# Patient Record
Sex: Male | Born: 1935
Health system: Southern US, Community
[De-identification: ages and names within clinical notes are randomized; demographics above are authoritative.]

## PROBLEM LIST (undated history)

## (undated) DIAGNOSIS — Z973 Presence of spectacles and contact lenses: Secondary | ICD-10-CM

## (undated) DIAGNOSIS — I10 Essential (primary) hypertension: Secondary | ICD-10-CM

## (undated) DIAGNOSIS — E039 Hypothyroidism, unspecified: Secondary | ICD-10-CM

## (undated) DIAGNOSIS — K219 Gastro-esophageal reflux disease without esophagitis: Secondary | ICD-10-CM

## (undated) DIAGNOSIS — I499 Cardiac arrhythmia, unspecified: Secondary | ICD-10-CM

## (undated) DIAGNOSIS — R51 Headache: Secondary | ICD-10-CM

## (undated) DIAGNOSIS — E785 Hyperlipidemia, unspecified: Secondary | ICD-10-CM

## (undated) DIAGNOSIS — N400 Enlarged prostate without lower urinary tract symptoms: Secondary | ICD-10-CM

## (undated) DIAGNOSIS — R519 Headache, unspecified: Secondary | ICD-10-CM

## (undated) DIAGNOSIS — C801 Malignant (primary) neoplasm, unspecified: Secondary | ICD-10-CM

## (undated) DIAGNOSIS — I639 Cerebral infarction, unspecified: Secondary | ICD-10-CM

## (undated) DIAGNOSIS — N3281 Overactive bladder: Secondary | ICD-10-CM

## (undated) DIAGNOSIS — R0600 Dyspnea, unspecified: Secondary | ICD-10-CM

## (undated) DIAGNOSIS — F419 Anxiety disorder, unspecified: Secondary | ICD-10-CM

## (undated) DIAGNOSIS — G459 Transient cerebral ischemic attack, unspecified: Secondary | ICD-10-CM

## (undated) DIAGNOSIS — M1990 Unspecified osteoarthritis, unspecified site: Secondary | ICD-10-CM

## (undated) DIAGNOSIS — N189 Chronic kidney disease, unspecified: Secondary | ICD-10-CM

## (undated) HISTORY — DX: Essential (primary) hypertension: I10

## (undated) HISTORY — DX: Hypothyroidism, unspecified: E03.9

## (undated) HISTORY — DX: Chronic kidney disease, unspecified: N18.9

## (undated) HISTORY — PX: TRIGGER FINGER RELEASE: SHX641

## (undated) HISTORY — PX: CATARACT EXTRACTION: SUR2

## (undated) HISTORY — DX: Cardiac arrhythmia, unspecified: I49.9

## (undated) HISTORY — DX: Hyperlipidemia, unspecified: E78.5

## (undated) HISTORY — PX: EYE SURGERY: SHX253

## (undated) HISTORY — PX: COLONOSCOPY: SHX174

---

## 1997-10-17 HISTORY — PX: KNEE ARTHROPLASTY: SHX992

## 2006-10-17 HISTORY — PX: KNEE ARTHROPLASTY: SHX992

## 2008-09-22 IMAGING — CR DG CHEST 2V
2 series · 2 of 2 positions shown · non-contrast
Comparison: No priors

CLINICAL DATA: Pre admit for knee surgery

CHEST - 2 VIEW

[view not recorded (1 of 2)]
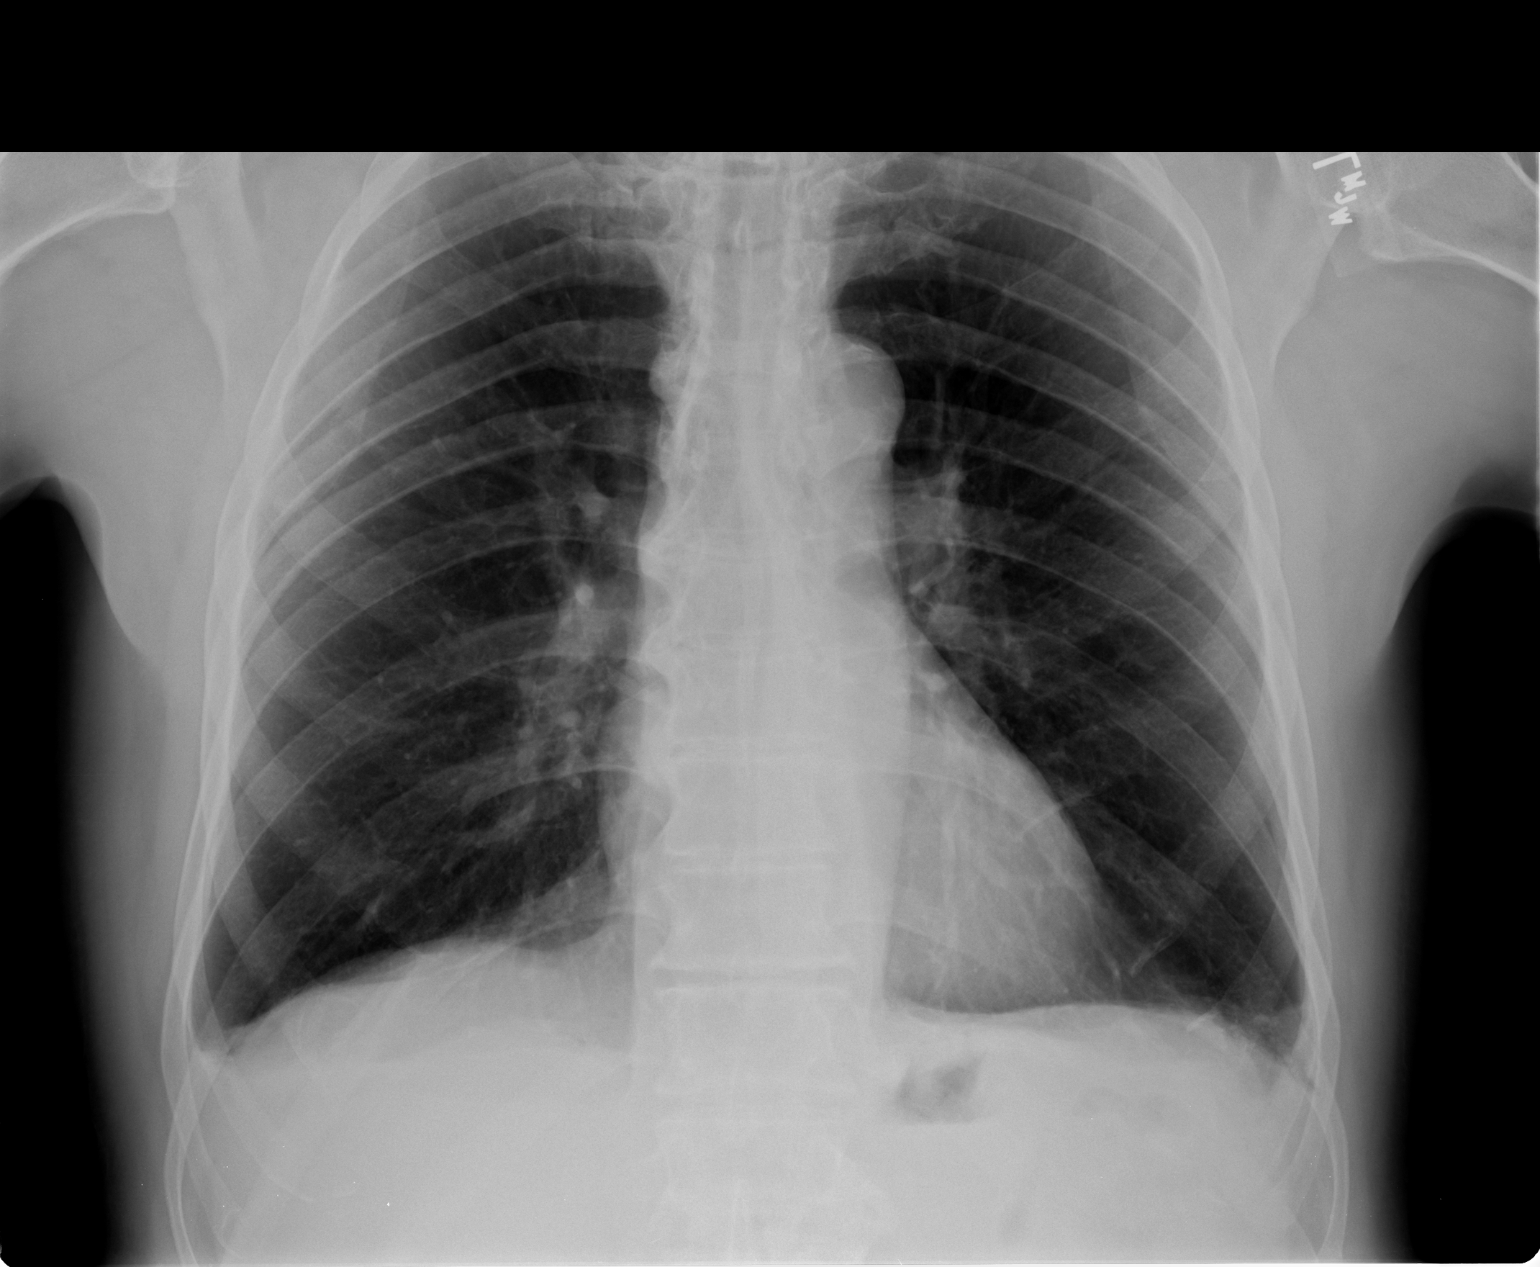

[view not recorded (2 of 2)]
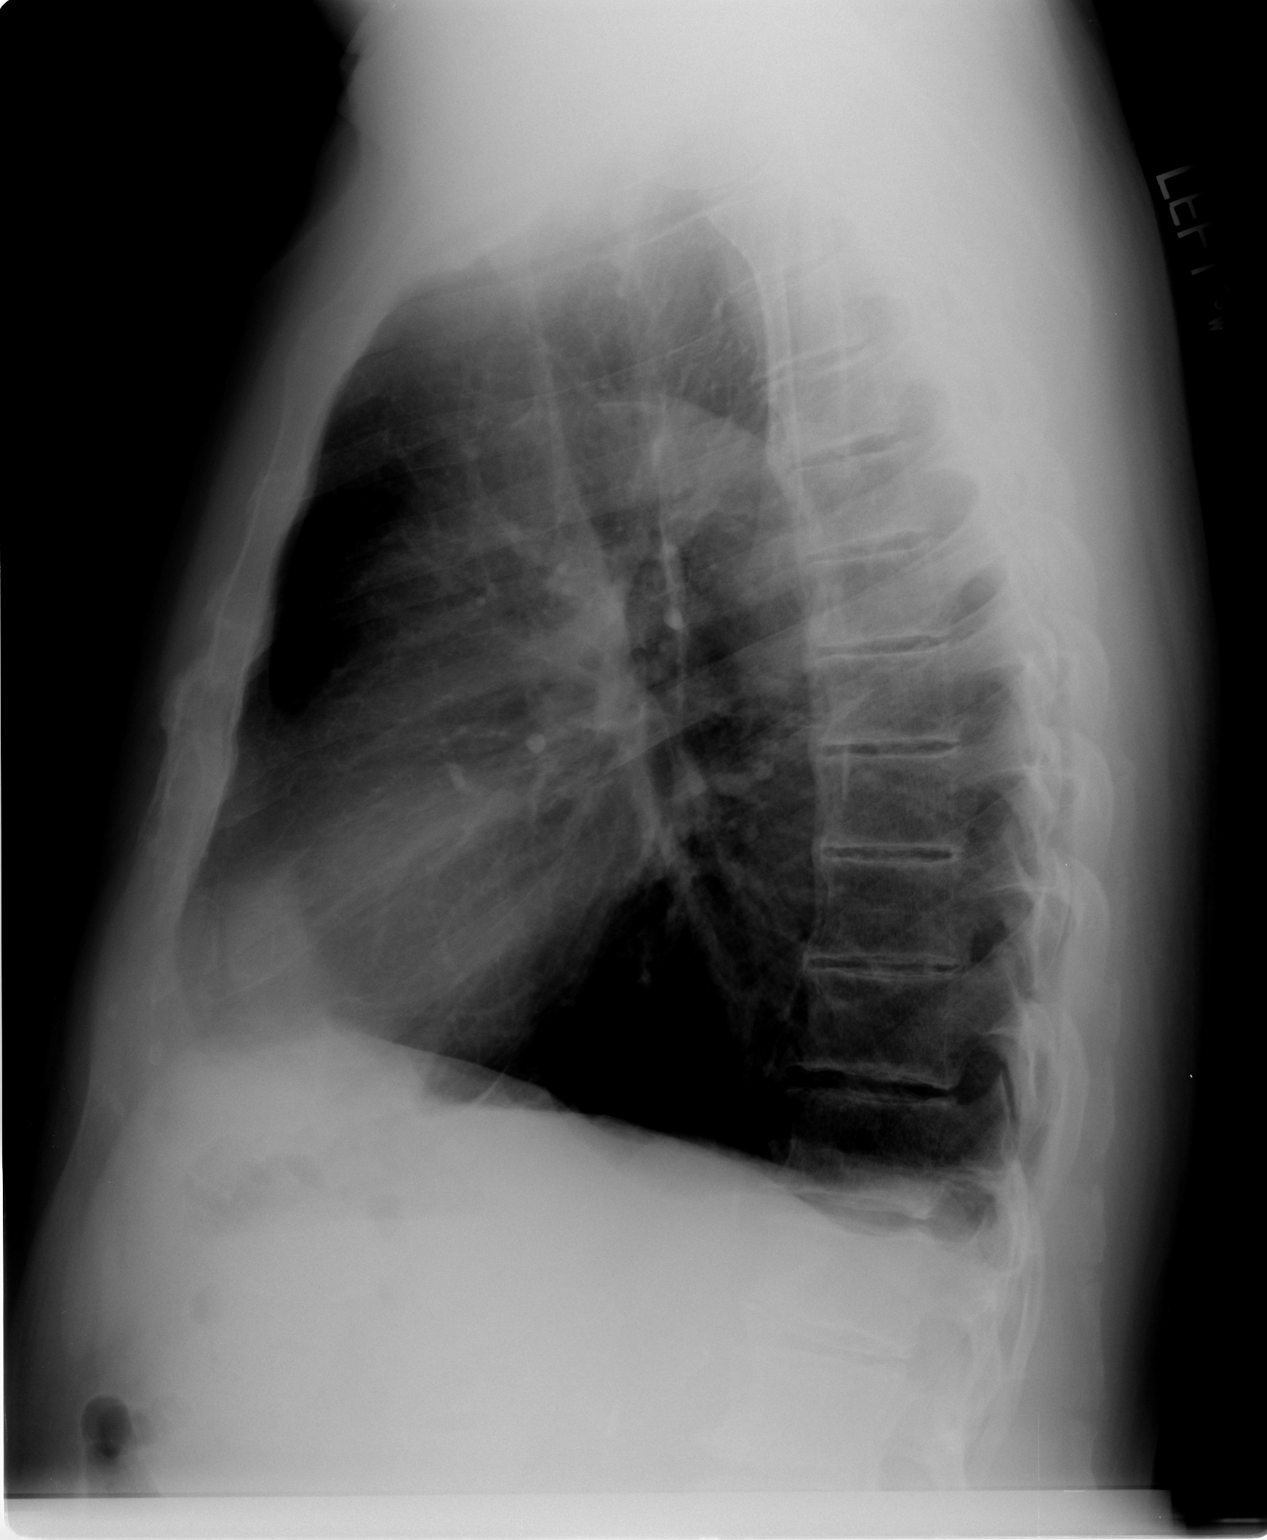

[2 of 2 positions shown; findings below may reference images not displayed]

FINDINGS: Heart and mediastinal contours normal.  Lungs
hyperaerated with scarring at the bases.  To no acute pulmonary
disease.  Osseous structures intact.
IMPRESSION: COPD with scarring at the bases - no active disease.

## 2008-09-26 ENCOUNTER — Inpatient Hospital Stay (HOSPITAL_COMMUNITY): Admission: RE | Admit: 2008-09-26 | Discharge: 2008-09-30 | Payer: Self-pay | Admitting: Orthopedic Surgery

## 2008-09-28 IMAGING — CR DG CHEST 1V PORT
1 series · 1 of 1 positions shown · non-contrast
Comparison: [DATE].

CLINICAL DATA: Fever.  Osteoarthritis.

PORTABLE CHEST - 1 VIEW

[view not recorded]
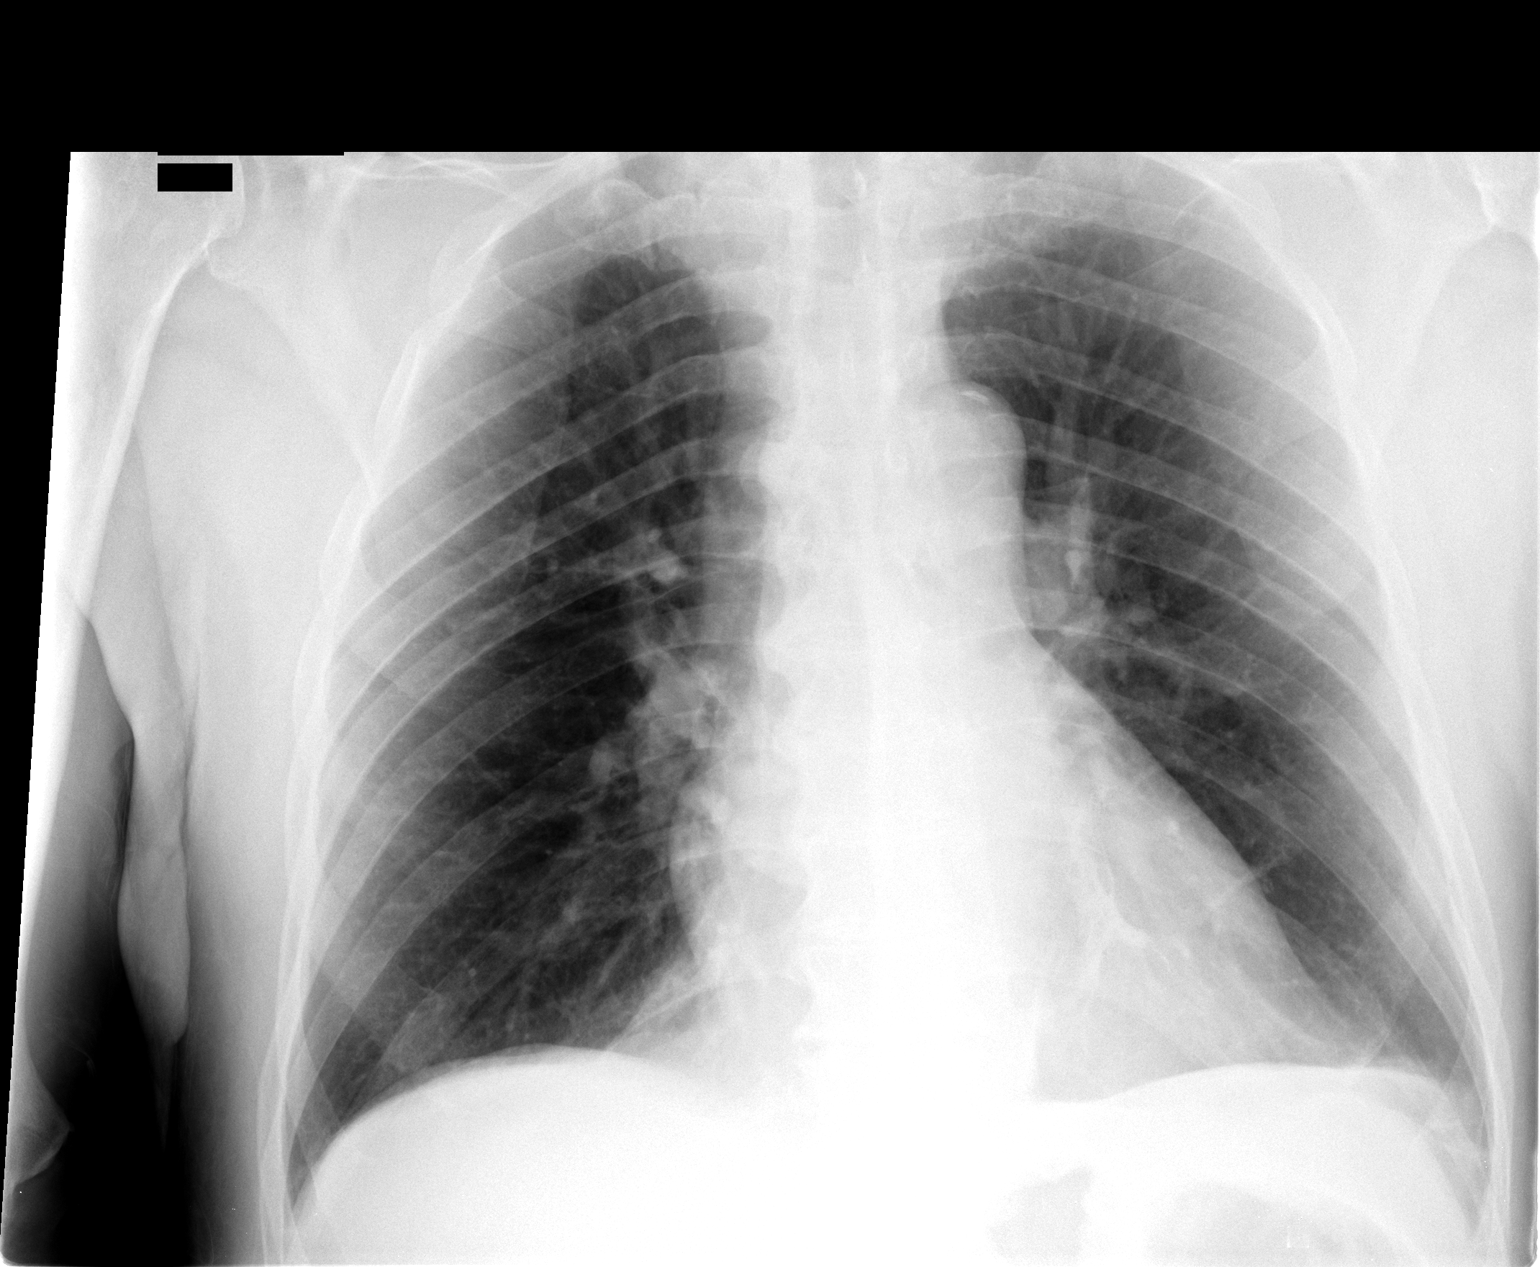

[1 of 1 positions shown; findings below may reference images not displayed]

FINDINGS: [RO] hours.  The heart size and mediastinal contours are
stable.  There is stable linear scarring or atelectasis at the left
lung base.  The lungs are otherwise clear.  There is no pleural
effusion or pneumothorax.
IMPRESSION: Stable left basilar scarring or atelectasis.  No acute findings.

## 2011-03-01 NOTE — Op Note (Signed)
[name], [name]              ACCOUNT NO.:  [id number]   MEDICAL RECORD NO.:  [id number]          PATIENT TYPE:  INP   LOCATION:  2899                         FACILITY:  [facility]   PHYSICIAN:  Ollen Gross, M.D.    DATE OF BIRTH:  [birth year]   DATE OF PROCEDURE:  09/26/2008  DATE OF DISCHARGE:                               OPERATIVE REPORT   PREOPERATIVE DIAGNOSIS:  Osteoarthritis, right knee.   POSTOPERATIVE DIAGNOSIS:  Osteoarthritis, right knee.   PROCEDURE:  Right total knee arthroplasty.   SURGEON:  Ollen Gross, M.D.   ASSISTANT:  Avel Peace PA-C   ANESTHESIA:  Spinal and a femoral block.   ESTIMATED BLOOD LOSS:  Minimal.   DRAIN:  None.   TOURNIQUET TIME:  46 minutes at 300 mmHg.   COMPLICATIONS:  None.   CONDITION.:  Stable to recovery room.   CLINICAL NOTE:  Scott [name] is a 77 year old male with end-stage  arthritis of the right knee with progressively worsening pain and  dysfunction.  He has failed nonoperative management and presents now for  right total knee arthroplasty.   PROCEDURE IN DETAIL:  After successful administration of femoral block  and then spinal anesthetic a tourniquet was placed high on his right  thigh and right lower extremity prepped and draped in the usual sterile  fashion.  Extremity was wrapped in Esmarch, knee flexed and tourniquet  inflated to 300 mmHg.  Midline incision was made with a 10 blade through  subcutaneous tissue to the level of the extensor mechanism.  A fresh  blade was used make a medial parapatellar arthrotomy.  Soft tissue of  the proximal medial tibia was subperiosteally elevated to the joint line  with a knife and into the semimembranosus bursa with a Cobb elevator.  Soft tissue laterally was elevated with attention being paid to avoiding  the patellar tendon on tibial tubercle.  The patella subluxed laterally,  knee flexed 90 degrees, and ACL and PCL were removed.  Drill was used  create a starting hole in  the distal femur and the canal was thoroughly  irrigated.  The 5-degree right valgus alignment guide was placed  referencing off the posterior condyles, rotations marked on the block  pins to remove 11 mm of the distal femur.  The 11 mm were resected due  to the flexion contracture.  Distal femoral resection was made with an  oscillating saw.  Sizing blocks placed and size 4 was most appropriate.  Rotations marked off the epicondylar axis.  Size 4 cutting block was  placed and the anterior-posterior chamfer cuts made.   The tibia subluxed forward and the menisci were removed.  Extramedullary  tibial alignment guide was placed, referencing proximally at the medial  aspect of the tibial tubercle and distally along the second metatarsal  axis and tibial crest.  The block was pinned to remove approximately 10  mm from the non-deficient lateral side.  Tibial resection was made with  an oscillating saw.  Size 4 was the most appropriate tibial component  and the proximal tibia was prepared to modular drill and keel punch for  the size 4.  Femoral preparation was completed with the intercondylar  cut.   Size 4 mobile bearing tibial trial, size 4 posterior stabilized femoral  trial and a 10 mm posterior stabilized rotating platform insert trial  were placed.  With the 10, full extension was  achieved with excellent  varus, valgus, anterior and posterior balance throughout full range of  motion.  The patella was everted and thickness measured to be 23 mm.  Freehand resection was taken to 13 mm, 35 template was placed, lug holes  were drilled, trial patella was placed and it tracks normally.  Osteophytes were removed off the posterior femur with the trial in  place.  All trials were removed and the cut bone surfaces were prepared  with pulsatile lavage.  Cement was mixed and once ready for  implantation, a size 4 mobile bearing tibial tray, size 4 posterior  stabilized femur and 35 patella were  cemented into place.  The patella  was held with a clamp.  Trial 10-mm inserts placed, knee held in full  extension and all extruded cement removed.  When the cement was fully  hardened, then the  permanent 10 mm posterior stabilized rotating  platform insert was placed into the tibial tray.  The wound was  copiously irrigated with saline solution and the FloSeal injected on the  posterior capsule, medial and lateral gutters and suprapatellar area.  Moist sponge was placed and tourniquet released for a total time of 46  minutes.  The sponge was held 2 minutes then removed.  Minimal bleeding  was encountered.  The bleeding that was encountered was stopped with  electrocautery.   The wound was copiously irrigated with saline solution and the  arthrotomy closed with interrupted #1 PDS.  Flexion against gravity was  135 degrees.  Subcu was closed with interrupted 2-0 Vicryl and  subcuticular running 4-0 Monocryl.  Incisions cleaned and dried and  Steri-Strips and a bulky sterile dressing applied.  The leg was placed  into a knee immobilizer and he was awakened and transported to recovery  in stable condition.      Ollen Gross, M.D.  Electronically Signed     FA/MEDQ  D:  09/26/2008  T:  09/26/2008  Job:  [id number]

## 2011-03-01 NOTE — Discharge Summary (Signed)
[name], [name]              ACCOUNT NO.:  [id number]   MEDICAL RECORD NO.:  [id number]          PATIENT TYPE:  INP   LOCATION:  5016                         FACILITY:  [facility]   PHYSICIAN:  Ollen Gross, M.D.    DATE OF BIRTH:  Sep 19, 1928   DATE OF ADMISSION:  09/26/2008  DATE OF DISCHARGE:  09/30/2008                               DISCHARGE SUMMARY   ADMISSION DIAGNOSES:  1. Osteoarthritis, right knee.  2. Hypertension.  3. Reflux disease.  4. Hemorrhoids.  5. Hypothyroidism.  6. Arthritis.  7. Childhood illnesses:  Measles, mumps, Rubella, and chickenpox.   DISCHARGE DIAGNOSES:  1. Osteoarthritis, right knee, status post right total knee      replacement arthroplasty.  2. Shoulder bursitis, recurrence.  3. Sinus congestion.  4. Mild postoperative hyponatremia.  5. Hypertension.  6. Reflux disease.  7. Hemorrhoids.  8. Hypothyroidism.  9. Arthritis.  10.Childhood illnesses:  Measles, mumps, Rubella, and chickenpox.   PROCEDURE:  On September 26, 2008, right total hip.   SURGEON:  Ollen Gross, M.D.   ASSISTANT:  Alexzandrew L. Perkins, P.A.-C.   ANESTHESIA:  General with a femoral block.   CONSULTS:  None.   BRIEF HISTORY:  Scott [name] is a 77 year old male seen as a second  opinion with regards to his right knee.  He has known end-stage  arthritis, felt to be a good candidate.  Risks and benefits were  discussed.  He elected to proceed with surgery.   LABORATORY DATA:  Preop CBC showed a hemoglobin of 14.7, hematocrit  43.9, white cell count 5.2, platelets 182.  Chem panel all within normal  limits.  Preop UA is negative.  Serial CBCs were followed throughout the  hospital course.  Hemoglobin dropped down to 12.4, then 10.6, last noted  at 10.5 with a creatinine of 31.4.  Serial pro times followed per  Coumadin protocol postoperatively.  INR started out at 1.1.  Last-noted  PT/INR 22.4 and 1.9.  Chem panel on admission within normal limits.  Sodium did  drop to 135 and went down a little bit further to 130.  It  stabilized at 130.  The remaining electrolytes remained within normal  limits.   X-RAYS:  Portable chest on September 28, 2008:  Stable left basilar  scarring or atelectasis.  No acute findings.   Preop chest x-ray dated September 22, 2008:  COPD with scarring at the  base.  No active disease.   EKG dated August 18, 2008:  Sinus rhythm, unconfirmed.   HOSPITAL COURSE:  Patient was admitted to [facility],  tolerated to the OR, underwent the above-stated procedure without  complications.  Patient tolerate the procedure well, and was later  transferred to the recovery room and then to the orthopedic floor.  Given 24 hours of postop IV antibiotics and started on PCA and p.o.  analgesics for pain control following surgery.   On day #1, doing fairly well, so the PCA was discontinued on day #1 and  removed the Foley a little bit later that day.  He started complaining  of some shoulder pain.  He  has a history of some shoulder complaints and  felt he has developed a little bit of bursitis.  Labs looked good.  Started getting up out of bed.   By day #2, he was doing some minimal assistance.  Seen  by weekend  coverage.  Dressing was changed.  The incision was healing well.  Compartments were soft.  No complaints.  Discharge planning got involved  postoperatively because it was felt that he would require some type of  skilled facility.  PT/OT worked with her.  He was walking about 10 feet  or so by day #2.   By day #3, he is seen back in rounds and was developing some sinus  congestion.  Also, the right shoulder pain had gotten worse.  He had a  little low-grade temp, so put him on some antibiotics in the form of  Levaquin.  We also put him on a dose pack for the right shoulder  bursitis.  We are awaiting bed availability.  Seen back on rounds on  September 30, 2008.  He is doing much better from a therapy standpoint.  He  is walking about 75 feet.  We are waiting on a bed to become  available at skilled facility.  Once bed was available, we will allow  him to transfer at that time.   DISCHARGE PLAN:  Tentative discharge on September 30, 2008.   DISCHARGE DIAGNOSES:  Please see above.   DISCHARGE MEDICATIONS:  1. Labetalol 200 mg p.o. daily.  2. Levothyroxine 125 mg p.o. daily.  3. Prilosec 20 mg p.o. daily.  4. Cardura 8 mg p.o. nightly.  5. Norvasc 2.5 mg p.o. nightly.  6. Coumadin protocol.  Please titrate the Coumadin level for a target      INR between 2 and 3.  He is to be on Coumadin for 3 weeks from the      date of surgery on September 26, 2008.  7. Colace 100 mg p.o. b.i.d.  8. Singulair 10 mg p.o. daily.  9. Medrol Dosepak 4 mg x6 days.  Take as directed.  10.Avelox 400 mg p.o. daily for 8 more days, to complete a 10-day      course.  11.Imodium p.r.n.  12.Percocet 5 mg 1-2 every 4 hours as needed for pain.  13.Tylenol 325 1-2 every 4-6 hours as needed for mild pain, temp, or      headache.  14.Robaxin 500 mg p.o. q.6-8h. p.r.n. spasm.   DIET:  Low sodium, heart-healthy diet.   ACTIVITY:  Weightbearing as tolerated to the right lower extremity.  Total knee protocol.  PT/OT for gait training, ambulation, and ADLs.  May start showering; however, do not submerge the incision under water.  Daily dressing changes.   FOLLOW UP:  Needs to follow up with Dr. Lequita Halt from 2 weeks of the date  of surgery.  Please contact the office at [phone] to arrange an  appointment time to follow up with this patient.   DISPOSITION:  Pending at this time, waiting on bed availability.   CONDITION ON DISCHARGE:  Improving.      Alexzandrew L. Perkins, P.A.C.      Ollen Gross, M.D.  Electronically Signed    ALP/MEDQ  D:  09/30/2008  T:  09/30/2008  Job:  [id number]   cc:   Dr. Toma Deiters, PA-C

## 2011-03-01 NOTE — H&P (Signed)
Scott [name], Scott [name]              ACCOUNT NO.:  [id number]   MEDICAL RECORD NO.:  [id number]          PATIENT TYPE:  INP   LOCATION:  NA                           FACILITY:  [facility]   PHYSICIAN:  Alexzandrew L. Perkins, P.A.C.DATE OF BIRTH:  1928-08-06   DATE OF ADMISSION:  09/26/2008  DATE OF DISCHARGE:                              HISTORY & PHYSICAL   CHIEF COMPLAINT:  Right knee pain.   HISTORY OF PRESENT ILLNESS:  The patient is a 77 year old male who in  seen in second opinion with regards to the right knee.  She has  bilateral knee pain, but the right is more symptomatic and problematic  than the left.  She had left total knee done about 10 or 11 years ago.  She is having some problems with that knee.  Unfortunately, the right  knee is the more difficult knee at this time.  It is limiting her  activities and the pain has gotten worse.  She has been treated  conservatively in the past.  She has undergone injections.  X-rays when  she comes into the office show that she does have some end-stage  arthritis with severe bone-on-bone medial and patellofemoral  compartments.  Left knee prosthesis appears to be in good position with  no obvious prosthetic abnormalities appreciated.  It is felt that she  would benefit undergoing total knee replacement.  Risks and benefits  have been discussed.  She elects to proceed with surgery.  She has been  seen preoperatively by Vanetta Mulders, PA that works for Dr. Eduard Clos and the  patient is felt to be stable for surgery.   ALLERGIES:  No known drug allergies.   INTOLERANCES:  MORPHINE causes nausea.   CURRENT MEDICATIONS:  Naproxen, acetaminophen, labetalol, levothyroxine,  omeprazole, doxazosin, amlodipine, lisinopril, an allergy pill Imodium.   PAST MEDICAL HISTORY:  Hypertension, reflux disease, hemorrhoids,  hypothyroidism, arthritis.  Childhood illnesses of measles, mumps,  rubella, and chickenpox.   PAST SURGICAL HISTORY:  Left knee  replacement in September 1998.  Carpal  tunnel surgery, 2004.  Two trigger fingers, right hand, 2004.   FAMILY HISTORY:  Father deceased at age 47.  He was quadriplegic  secondary to a fall.  Mother deceased at age 51 with heart attack.   SOCIAL HISTORY:  She is married, past smoker.  Family will be assisting  with care after surgery, lives in a one-storey home with 2 steps  entering.  She does have a living will and healthcare power of attorney.   REVIEW OF SYSTEMS:  GENERAL:  No fevers, chills, or night sweats.  NEUROLOGIC:  No seizures, syncope, or paralysis.  RESPIRATORY:  No  shortness of breath, productive cough, or hemoptysis.  CARDIOVASCULAR:  No chest pain or orthopnea.  GI:  Little bit of diarrhea.  No nausea,  vomiting, or constipation.  GU:  A little bit of weak stream, nocturia.  No dysuria or hematuria.  MUSCULOSKELETAL:  Joint pain and stiffness.   PHYSICAL EXAMINATION:  VITAL SIGNS:  Pulse 64, respiratory rate 12, and  blood pressure 144/78.  GENERAL:  A 77 year old white male,  well nourished, well developed, in  no acute distress.  She is alert, oriented, and cooperative.  Very  pleasant at the time of exam.  HEENT:  Normocephalic and atraumatic.  Pupils are equal, round, and  reactive.  Oropharynx is clear.  EOMs intact.  NECK:  Supple.  CHEST:  Clear.  HEART:  Regular rate and rhythm with occasional skipped beat or dropped  beat.  Pulse, somewhat of regular occurring pulse.  ABDOMEN:  Soft, nontender.  Bowel sounds present.  RECTAL:  Not done.  BREASTS:  Not done.  GENITALIA:  Not done.  EXTREMITIES:  Left knee, no effusion.  Range of motion is 10-120,  moderate crepitus, tender more medial than lateral.   IMPRESSION:  Osteoarthritis, right knee.   PLAN:  The patient admitted to [facility] to undergo right  total knee replacement arthroplasty.  Surgery will be performed by Dr.  Ollen Gross.      Alexzandrew L. Perkins, P.A.C.     ALP/MEDQ   D:  09/25/2008  T:  09/26/2008  Job:  [id number]   cc:   Ollen Gross, M.D.  Vanetta Mulders, PA-C  Charles D. Eduard Clos, MD

## 2011-07-22 LAB — PROTIME-INR
INR: 1 (ref 0.00–1.49)
INR: 1.1 (ref 0.00–1.49)
INR: 1.4 (ref 0.00–1.49)
INR: 1.9 — ABNORMAL HIGH (ref 0.00–1.49)
Prothrombin Time: 13.2 seconds (ref 11.6–15.2)
Prothrombin Time: 15 s (ref 11.6–15.2)
Prothrombin Time: 17.6 s — ABNORMAL HIGH (ref 11.6–15.2)
Prothrombin Time: 22.4 s — ABNORMAL HIGH (ref 11.6–15.2)

## 2011-07-22 LAB — BASIC METABOLIC PANEL WITH GFR
BUN: 18 mg/dL (ref 6–23)
CO2: 27 meq/L (ref 19–32)
Calcium: 7.8 mg/dL — ABNORMAL LOW (ref 8.4–10.5)
Chloride: 98 meq/L (ref 96–112)
Creatinine, Ser: 1.33 mg/dL (ref 0.4–1.5)
GFR calc non Af Amer: 52 mL/min — ABNORMAL LOW
Glucose, Bld: 134 mg/dL — ABNORMAL HIGH (ref 70–99)
Potassium: 4.2 meq/L (ref 3.5–5.1)
Sodium: 130 meq/L — ABNORMAL LOW (ref 135–145)

## 2011-07-22 LAB — CBC
HCT: 31.1 % — ABNORMAL LOW (ref 39.0–52.0)
HCT: 31.4 % — ABNORMAL LOW (ref 39.0–52.0)
HCT: 35.5 % — ABNORMAL LOW (ref 39.0–52.0)
HCT: 43.9 % (ref 39.0–52.0)
Hemoglobin: 10.5 g/dL — ABNORMAL LOW (ref 13.0–17.0)
Hemoglobin: 10.6 g/dL — ABNORMAL LOW (ref 13.0–17.0)
Hemoglobin: 12.4 g/dL — ABNORMAL LOW (ref 13.0–17.0)
MCHC: 33.5 g/dL (ref 30.0–36.0)
MCHC: 34 g/dL (ref 30.0–36.0)
MCV: 93.4 fL (ref 78.0–100.0)
MCV: 93.6 fL (ref 78.0–100.0)
MCV: 94.3 fL (ref 78.0–100.0)
Platelets: 122 K/uL — ABNORMAL LOW (ref 150–400)
RBC: 3.32 MIL/uL — ABNORMAL LOW (ref 4.22–5.81)
RBC: 3.33 MIL/uL — ABNORMAL LOW (ref 4.22–5.81)
RBC: 4.71 MIL/uL (ref 4.22–5.81)
RDW: 14.1 % (ref 11.5–15.5)
RDW: 14.1 % (ref 11.5–15.5)
WBC: 11.8 K/uL — ABNORMAL HIGH (ref 4.0–10.5)
WBC: 5.2 10*3/uL (ref 4.0–10.5)

## 2011-07-22 LAB — BASIC METABOLIC PANEL
CO2: 27 mEq/L (ref 19–32)
CO2: 28 mEq/L (ref 19–32)
Chloride: 96 mEq/L (ref 96–112)
Creatinine, Ser: 1.22 mg/dL (ref 0.4–1.5)
GFR calc Af Amer: >60 mL/min (ref >60)
GFR calc non Af Amer: >60 mL/min (ref >60)
Glucose, Bld: 142 mg/dL — ABNORMAL HIGH (ref 70–99)
Potassium: 3.8 mEq/L (ref 3.5–5.1)
Potassium: 4.3 mEq/L (ref 3.5–5.1)
Sodium: 135 mEq/L (ref 135–145)

## 2011-07-22 LAB — COMPREHENSIVE METABOLIC PANEL WITH GFR
ALT: 16 U/L (ref 0–53)
AST: 20 U/L (ref 0–37)
Albumin: 3.3 g/dL — ABNORMAL LOW (ref 3.5–5.2)
Alkaline Phosphatase: 65 U/L (ref 39–117)
BUN: 14 mg/dL (ref 6–23)
CO2: 28 meq/L (ref 19–32)
Calcium: 8.6 mg/dL (ref 8.4–10.5)
Chloride: 105 meq/L (ref 96–112)
Creatinine, Ser: 1.27 mg/dL (ref 0.4–1.5)
GFR calc non Af Amer: 55 mL/min — ABNORMAL LOW
Glucose, Bld: 79 mg/dL (ref 70–99)
Potassium: 4.3 meq/L (ref 3.5–5.1)
Sodium: 138 meq/L (ref 135–145)
Total Bilirubin: 0.5 mg/dL (ref 0.3–1.2)
Total Protein: 5.4 g/dL — ABNORMAL LOW (ref 6.0–8.3)

## 2011-07-22 LAB — TYPE AND SCREEN
ABO/RH(D): O POS
Antibody Screen: NEGATIVE

## 2011-07-22 LAB — ABO/RH: ABO/RH(D): O POS

## 2011-07-22 LAB — URINALYSIS, ROUTINE W REFLEX MICROSCOPIC
Bilirubin Urine: NEGATIVE
Ketones, ur: 15 mg/dL — AB
Nitrite: NEGATIVE
Urobilinogen, UA: 1 mg/dL (ref 0.0–1.0)

## 2011-07-22 LAB — APTT: aPTT: 34 seconds (ref 24–37)

## 2011-10-26 DIAGNOSIS — M5137 Other intervertebral disc degeneration, lumbosacral region: Secondary | ICD-10-CM | POA: Diagnosis not present

## 2011-10-26 DIAGNOSIS — M47817 Spondylosis without myelopathy or radiculopathy, lumbosacral region: Secondary | ICD-10-CM | POA: Diagnosis not present

## 2011-12-05 DIAGNOSIS — M48061 Spinal stenosis, lumbar region without neurogenic claudication: Secondary | ICD-10-CM | POA: Diagnosis not present

## 2011-12-05 DIAGNOSIS — M5380 Other specified dorsopathies, site unspecified: Secondary | ICD-10-CM | POA: Diagnosis not present

## 2011-12-05 DIAGNOSIS — M47817 Spondylosis without myelopathy or radiculopathy, lumbosacral region: Secondary | ICD-10-CM | POA: Diagnosis not present

## 2011-12-05 DIAGNOSIS — M5137 Other intervertebral disc degeneration, lumbosacral region: Secondary | ICD-10-CM | POA: Diagnosis not present

## 2011-12-07 DIAGNOSIS — M5126 Other intervertebral disc displacement, lumbar region: Secondary | ICD-10-CM | POA: Diagnosis not present

## 2011-12-07 DIAGNOSIS — M5137 Other intervertebral disc degeneration, lumbosacral region: Secondary | ICD-10-CM | POA: Diagnosis not present

## 2011-12-07 DIAGNOSIS — M48061 Spinal stenosis, lumbar region without neurogenic claudication: Secondary | ICD-10-CM | POA: Diagnosis not present

## 2011-12-07 DIAGNOSIS — M47817 Spondylosis without myelopathy or radiculopathy, lumbosacral region: Secondary | ICD-10-CM | POA: Diagnosis not present

## 2012-02-08 DIAGNOSIS — M47817 Spondylosis without myelopathy or radiculopathy, lumbosacral region: Secondary | ICD-10-CM | POA: Diagnosis not present

## 2012-02-08 DIAGNOSIS — M48061 Spinal stenosis, lumbar region without neurogenic claudication: Secondary | ICD-10-CM | POA: Diagnosis not present

## 2012-02-08 DIAGNOSIS — I10 Essential (primary) hypertension: Secondary | ICD-10-CM | POA: Diagnosis not present

## 2012-02-08 DIAGNOSIS — M5380 Other specified dorsopathies, site unspecified: Secondary | ICD-10-CM | POA: Diagnosis not present

## 2012-02-08 DIAGNOSIS — M5137 Other intervertebral disc degeneration, lumbosacral region: Secondary | ICD-10-CM | POA: Diagnosis not present

## 2012-02-08 DIAGNOSIS — E039 Hypothyroidism, unspecified: Secondary | ICD-10-CM | POA: Diagnosis not present

## 2012-02-08 DIAGNOSIS — E7800 Pure hypercholesterolemia, unspecified: Secondary | ICD-10-CM | POA: Diagnosis not present

## 2012-02-15 DIAGNOSIS — M1990 Unspecified osteoarthritis, unspecified site: Secondary | ICD-10-CM | POA: Diagnosis not present

## 2012-02-15 DIAGNOSIS — M545 Low back pain: Secondary | ICD-10-CM | POA: Diagnosis not present

## 2012-02-15 DIAGNOSIS — I10 Essential (primary) hypertension: Secondary | ICD-10-CM | POA: Diagnosis not present

## 2012-02-15 DIAGNOSIS — E039 Hypothyroidism, unspecified: Secondary | ICD-10-CM | POA: Diagnosis not present

## 2012-02-17 DIAGNOSIS — Z79899 Other long term (current) drug therapy: Secondary | ICD-10-CM | POA: Diagnosis not present

## 2012-02-17 DIAGNOSIS — I10 Essential (primary) hypertension: Secondary | ICD-10-CM | POA: Diagnosis not present

## 2012-02-17 DIAGNOSIS — K219 Gastro-esophageal reflux disease without esophagitis: Secondary | ICD-10-CM | POA: Diagnosis not present

## 2012-02-17 DIAGNOSIS — E039 Hypothyroidism, unspecified: Secondary | ICD-10-CM | POA: Diagnosis not present

## 2012-02-17 DIAGNOSIS — M5137 Other intervertebral disc degeneration, lumbosacral region: Secondary | ICD-10-CM | POA: Diagnosis not present

## 2012-03-14 DIAGNOSIS — M76899 Other specified enthesopathies of unspecified lower limb, excluding foot: Secondary | ICD-10-CM | POA: Diagnosis not present

## 2012-03-14 DIAGNOSIS — M5137 Other intervertebral disc degeneration, lumbosacral region: Secondary | ICD-10-CM | POA: Diagnosis not present

## 2012-03-14 DIAGNOSIS — M5380 Other specified dorsopathies, site unspecified: Secondary | ICD-10-CM | POA: Diagnosis not present

## 2012-03-14 DIAGNOSIS — M47817 Spondylosis without myelopathy or radiculopathy, lumbosacral region: Secondary | ICD-10-CM | POA: Diagnosis not present

## 2012-03-20 DIAGNOSIS — M76899 Other specified enthesopathies of unspecified lower limb, excluding foot: Secondary | ICD-10-CM | POA: Diagnosis not present

## 2012-03-20 DIAGNOSIS — I10 Essential (primary) hypertension: Secondary | ICD-10-CM | POA: Diagnosis not present

## 2012-03-20 DIAGNOSIS — K219 Gastro-esophageal reflux disease without esophagitis: Secondary | ICD-10-CM | POA: Diagnosis not present

## 2012-03-20 DIAGNOSIS — Z79899 Other long term (current) drug therapy: Secondary | ICD-10-CM | POA: Diagnosis not present

## 2012-03-20 DIAGNOSIS — E039 Hypothyroidism, unspecified: Secondary | ICD-10-CM | POA: Diagnosis not present

## 2012-03-20 DIAGNOSIS — M5137 Other intervertebral disc degeneration, lumbosacral region: Secondary | ICD-10-CM | POA: Diagnosis not present

## 2012-04-18 DIAGNOSIS — M47817 Spondylosis without myelopathy or radiculopathy, lumbosacral region: Secondary | ICD-10-CM | POA: Diagnosis not present

## 2012-04-18 DIAGNOSIS — M5380 Other specified dorsopathies, site unspecified: Secondary | ICD-10-CM | POA: Diagnosis not present

## 2012-04-18 DIAGNOSIS — M48061 Spinal stenosis, lumbar region without neurogenic claudication: Secondary | ICD-10-CM | POA: Diagnosis not present

## 2012-04-18 DIAGNOSIS — M51379 Other intervertebral disc degeneration, lumbosacral region without mention of lumbar back pain or lower extremity pain: Secondary | ICD-10-CM | POA: Diagnosis not present

## 2012-04-18 DIAGNOSIS — M19019 Primary osteoarthritis, unspecified shoulder: Secondary | ICD-10-CM | POA: Diagnosis not present

## 2012-04-18 DIAGNOSIS — M25519 Pain in unspecified shoulder: Secondary | ICD-10-CM | POA: Diagnosis not present

## 2012-04-18 DIAGNOSIS — M5137 Other intervertebral disc degeneration, lumbosacral region: Secondary | ICD-10-CM | POA: Diagnosis not present

## 2012-04-24 DIAGNOSIS — M25519 Pain in unspecified shoulder: Secondary | ICD-10-CM | POA: Diagnosis not present

## 2012-04-24 DIAGNOSIS — M19019 Primary osteoarthritis, unspecified shoulder: Secondary | ICD-10-CM | POA: Diagnosis not present

## 2012-04-24 DIAGNOSIS — S46819A Strain of other muscles, fascia and tendons at shoulder and upper arm level, unspecified arm, initial encounter: Secondary | ICD-10-CM | POA: Diagnosis not present

## 2012-04-24 DIAGNOSIS — S46919A Strain of unspecified muscle, fascia and tendon at shoulder and upper arm level, unspecified arm, initial encounter: Secondary | ICD-10-CM | POA: Diagnosis not present

## 2012-04-30 DIAGNOSIS — M75120 Complete rotator cuff tear or rupture of unspecified shoulder, not specified as traumatic: Secondary | ICD-10-CM | POA: Diagnosis not present

## 2012-05-29 DIAGNOSIS — K219 Gastro-esophageal reflux disease without esophagitis: Secondary | ICD-10-CM | POA: Diagnosis not present

## 2012-05-29 DIAGNOSIS — M5137 Other intervertebral disc degeneration, lumbosacral region: Secondary | ICD-10-CM | POA: Diagnosis not present

## 2012-05-29 DIAGNOSIS — I10 Essential (primary) hypertension: Secondary | ICD-10-CM | POA: Diagnosis not present

## 2012-05-29 DIAGNOSIS — M25519 Pain in unspecified shoulder: Secondary | ICD-10-CM | POA: Diagnosis not present

## 2012-05-29 DIAGNOSIS — M76899 Other specified enthesopathies of unspecified lower limb, excluding foot: Secondary | ICD-10-CM | POA: Diagnosis not present

## 2012-05-29 DIAGNOSIS — IMO0001 Reserved for inherently not codable concepts without codable children: Secondary | ICD-10-CM | POA: Diagnosis not present

## 2012-05-29 DIAGNOSIS — M5380 Other specified dorsopathies, site unspecified: Secondary | ICD-10-CM | POA: Diagnosis not present

## 2012-05-29 DIAGNOSIS — E039 Hypothyroidism, unspecified: Secondary | ICD-10-CM | POA: Diagnosis not present

## 2012-05-29 DIAGNOSIS — Z79899 Other long term (current) drug therapy: Secondary | ICD-10-CM | POA: Diagnosis not present

## 2012-06-11 DIAGNOSIS — M75120 Complete rotator cuff tear or rupture of unspecified shoulder, not specified as traumatic: Secondary | ICD-10-CM | POA: Diagnosis not present

## 2012-06-13 DIAGNOSIS — M47817 Spondylosis without myelopathy or radiculopathy, lumbosacral region: Secondary | ICD-10-CM | POA: Diagnosis not present

## 2012-06-13 DIAGNOSIS — M5380 Other specified dorsopathies, site unspecified: Secondary | ICD-10-CM | POA: Diagnosis not present

## 2012-06-13 DIAGNOSIS — M48061 Spinal stenosis, lumbar region without neurogenic claudication: Secondary | ICD-10-CM | POA: Diagnosis not present

## 2012-06-13 DIAGNOSIS — M5137 Other intervertebral disc degeneration, lumbosacral region: Secondary | ICD-10-CM | POA: Diagnosis not present

## 2012-07-13 DIAGNOSIS — M545 Low back pain: Secondary | ICD-10-CM | POA: Diagnosis not present

## 2012-07-13 DIAGNOSIS — M1990 Unspecified osteoarthritis, unspecified site: Secondary | ICD-10-CM | POA: Diagnosis not present

## 2012-07-13 DIAGNOSIS — L299 Pruritus, unspecified: Secondary | ICD-10-CM | POA: Diagnosis not present

## 2012-07-13 DIAGNOSIS — Z23 Encounter for immunization: Secondary | ICD-10-CM | POA: Diagnosis not present

## 2012-08-24 DIAGNOSIS — E039 Hypothyroidism, unspecified: Secondary | ICD-10-CM | POA: Diagnosis not present

## 2012-08-24 DIAGNOSIS — M1990 Unspecified osteoarthritis, unspecified site: Secondary | ICD-10-CM | POA: Diagnosis not present

## 2012-08-24 DIAGNOSIS — I10 Essential (primary) hypertension: Secondary | ICD-10-CM | POA: Diagnosis not present

## 2012-08-24 DIAGNOSIS — N400 Enlarged prostate without lower urinary tract symptoms: Secondary | ICD-10-CM | POA: Diagnosis not present

## 2012-08-24 DIAGNOSIS — M545 Low back pain: Secondary | ICD-10-CM | POA: Diagnosis not present

## 2012-08-31 DIAGNOSIS — I10 Essential (primary) hypertension: Secondary | ICD-10-CM | POA: Diagnosis not present

## 2012-08-31 DIAGNOSIS — E039 Hypothyroidism, unspecified: Secondary | ICD-10-CM | POA: Diagnosis not present

## 2012-08-31 DIAGNOSIS — M1990 Unspecified osteoarthritis, unspecified site: Secondary | ICD-10-CM | POA: Diagnosis not present

## 2012-08-31 DIAGNOSIS — Z0000 Encounter for general adult medical examination without abnormal findings: Secondary | ICD-10-CM | POA: Diagnosis not present

## 2012-08-31 DIAGNOSIS — M545 Low back pain: Secondary | ICD-10-CM | POA: Diagnosis not present

## 2012-09-19 DIAGNOSIS — H26499 Other secondary cataract, unspecified eye: Secondary | ICD-10-CM | POA: Diagnosis not present

## 2012-10-02 DIAGNOSIS — J019 Acute sinusitis, unspecified: Secondary | ICD-10-CM | POA: Diagnosis not present

## 2012-10-24 DIAGNOSIS — M75120 Complete rotator cuff tear or rupture of unspecified shoulder, not specified as traumatic: Secondary | ICD-10-CM | POA: Diagnosis not present

## 2013-02-12 DIAGNOSIS — M545 Low back pain: Secondary | ICD-10-CM | POA: Diagnosis not present

## 2013-02-12 DIAGNOSIS — M1990 Unspecified osteoarthritis, unspecified site: Secondary | ICD-10-CM | POA: Diagnosis not present

## 2013-02-19 DIAGNOSIS — E039 Hypothyroidism, unspecified: Secondary | ICD-10-CM | POA: Diagnosis not present

## 2013-02-19 DIAGNOSIS — M545 Low back pain: Secondary | ICD-10-CM | POA: Diagnosis not present

## 2013-02-19 DIAGNOSIS — M1990 Unspecified osteoarthritis, unspecified site: Secondary | ICD-10-CM | POA: Diagnosis not present

## 2013-02-19 DIAGNOSIS — Z0000 Encounter for general adult medical examination without abnormal findings: Secondary | ICD-10-CM | POA: Diagnosis not present

## 2013-02-19 DIAGNOSIS — I10 Essential (primary) hypertension: Secondary | ICD-10-CM | POA: Diagnosis not present

## 2013-02-25 DIAGNOSIS — N189 Chronic kidney disease, unspecified: Secondary | ICD-10-CM | POA: Diagnosis not present

## 2013-02-25 DIAGNOSIS — I10 Essential (primary) hypertension: Secondary | ICD-10-CM | POA: Diagnosis not present

## 2013-02-25 DIAGNOSIS — M545 Low back pain: Secondary | ICD-10-CM | POA: Diagnosis not present

## 2013-02-25 DIAGNOSIS — M1990 Unspecified osteoarthritis, unspecified site: Secondary | ICD-10-CM | POA: Diagnosis not present

## 2013-02-25 DIAGNOSIS — E039 Hypothyroidism, unspecified: Secondary | ICD-10-CM | POA: Diagnosis not present

## 2013-02-25 DIAGNOSIS — I499 Cardiac arrhythmia, unspecified: Secondary | ICD-10-CM | POA: Diagnosis not present

## 2013-03-04 DIAGNOSIS — IMO0002 Reserved for concepts with insufficient information to code with codable children: Secondary | ICD-10-CM | POA: Diagnosis not present

## 2013-03-04 DIAGNOSIS — M48061 Spinal stenosis, lumbar region without neurogenic claudication: Secondary | ICD-10-CM | POA: Diagnosis not present

## 2013-03-12 DIAGNOSIS — IMO0002 Reserved for concepts with insufficient information to code with codable children: Secondary | ICD-10-CM | POA: Diagnosis not present

## 2013-03-12 DIAGNOSIS — M48061 Spinal stenosis, lumbar region without neurogenic claudication: Secondary | ICD-10-CM | POA: Diagnosis not present

## 2013-03-15 DIAGNOSIS — IMO0002 Reserved for concepts with insufficient information to code with codable children: Secondary | ICD-10-CM | POA: Diagnosis not present

## 2013-03-15 DIAGNOSIS — M48061 Spinal stenosis, lumbar region without neurogenic claudication: Secondary | ICD-10-CM | POA: Diagnosis not present

## 2013-03-19 DIAGNOSIS — M48061 Spinal stenosis, lumbar region without neurogenic claudication: Secondary | ICD-10-CM | POA: Diagnosis not present

## 2013-03-19 DIAGNOSIS — IMO0002 Reserved for concepts with insufficient information to code with codable children: Secondary | ICD-10-CM | POA: Diagnosis not present

## 2013-03-20 DIAGNOSIS — M48061 Spinal stenosis, lumbar region without neurogenic claudication: Secondary | ICD-10-CM | POA: Diagnosis not present

## 2013-03-20 DIAGNOSIS — IMO0002 Reserved for concepts with insufficient information to code with codable children: Secondary | ICD-10-CM | POA: Diagnosis not present

## 2013-03-26 DIAGNOSIS — M48061 Spinal stenosis, lumbar region without neurogenic claudication: Secondary | ICD-10-CM | POA: Diagnosis not present

## 2013-03-26 DIAGNOSIS — IMO0002 Reserved for concepts with insufficient information to code with codable children: Secondary | ICD-10-CM | POA: Diagnosis not present

## 2013-03-28 DIAGNOSIS — M48061 Spinal stenosis, lumbar region without neurogenic claudication: Secondary | ICD-10-CM | POA: Diagnosis not present

## 2013-03-28 DIAGNOSIS — IMO0002 Reserved for concepts with insufficient information to code with codable children: Secondary | ICD-10-CM | POA: Diagnosis not present

## 2013-04-03 DIAGNOSIS — M48061 Spinal stenosis, lumbar region without neurogenic claudication: Secondary | ICD-10-CM | POA: Diagnosis not present

## 2013-04-03 DIAGNOSIS — IMO0002 Reserved for concepts with insufficient information to code with codable children: Secondary | ICD-10-CM | POA: Diagnosis not present

## 2013-04-10 DIAGNOSIS — M48061 Spinal stenosis, lumbar region without neurogenic claudication: Secondary | ICD-10-CM | POA: Diagnosis not present

## 2013-04-10 DIAGNOSIS — M5380 Other specified dorsopathies, site unspecified: Secondary | ICD-10-CM | POA: Diagnosis not present

## 2013-04-10 DIAGNOSIS — M47817 Spondylosis without myelopathy or radiculopathy, lumbosacral region: Secondary | ICD-10-CM | POA: Diagnosis not present

## 2013-04-10 DIAGNOSIS — M5137 Other intervertebral disc degeneration, lumbosacral region: Secondary | ICD-10-CM | POA: Diagnosis not present

## 2013-04-16 DIAGNOSIS — M48061 Spinal stenosis, lumbar region without neurogenic claudication: Secondary | ICD-10-CM | POA: Diagnosis not present

## 2013-04-16 DIAGNOSIS — M5126 Other intervertebral disc displacement, lumbar region: Secondary | ICD-10-CM | POA: Diagnosis not present

## 2013-04-16 DIAGNOSIS — M5137 Other intervertebral disc degeneration, lumbosacral region: Secondary | ICD-10-CM | POA: Diagnosis not present

## 2013-04-16 DIAGNOSIS — M47817 Spondylosis without myelopathy or radiculopathy, lumbosacral region: Secondary | ICD-10-CM | POA: Diagnosis not present

## 2013-04-18 DIAGNOSIS — K219 Gastro-esophageal reflux disease without esophagitis: Secondary | ICD-10-CM | POA: Diagnosis not present

## 2013-04-18 DIAGNOSIS — Z96659 Presence of unspecified artificial knee joint: Secondary | ICD-10-CM | POA: Diagnosis not present

## 2013-04-18 DIAGNOSIS — IMO0001 Reserved for inherently not codable concepts without codable children: Secondary | ICD-10-CM | POA: Diagnosis not present

## 2013-04-18 DIAGNOSIS — M25519 Pain in unspecified shoulder: Secondary | ICD-10-CM | POA: Diagnosis not present

## 2013-04-18 DIAGNOSIS — M5380 Other specified dorsopathies, site unspecified: Secondary | ICD-10-CM | POA: Diagnosis not present

## 2013-04-18 DIAGNOSIS — M5137 Other intervertebral disc degeneration, lumbosacral region: Secondary | ICD-10-CM | POA: Diagnosis not present

## 2013-04-18 DIAGNOSIS — Z79899 Other long term (current) drug therapy: Secondary | ICD-10-CM | POA: Diagnosis not present

## 2013-04-18 DIAGNOSIS — I10 Essential (primary) hypertension: Secondary | ICD-10-CM | POA: Diagnosis not present

## 2013-04-18 DIAGNOSIS — E039 Hypothyroidism, unspecified: Secondary | ICD-10-CM | POA: Diagnosis not present

## 2013-04-18 DIAGNOSIS — M76899 Other specified enthesopathies of unspecified lower limb, excluding foot: Secondary | ICD-10-CM | POA: Diagnosis not present

## 2013-04-18 DIAGNOSIS — M47817 Spondylosis without myelopathy or radiculopathy, lumbosacral region: Secondary | ICD-10-CM | POA: Diagnosis not present

## 2013-05-24 DIAGNOSIS — M25519 Pain in unspecified shoulder: Secondary | ICD-10-CM | POA: Diagnosis not present

## 2013-05-24 DIAGNOSIS — M47817 Spondylosis without myelopathy or radiculopathy, lumbosacral region: Secondary | ICD-10-CM | POA: Diagnosis not present

## 2013-05-24 DIAGNOSIS — I10 Essential (primary) hypertension: Secondary | ICD-10-CM | POA: Diagnosis not present

## 2013-05-24 DIAGNOSIS — M76899 Other specified enthesopathies of unspecified lower limb, excluding foot: Secondary | ICD-10-CM | POA: Diagnosis not present

## 2013-05-24 DIAGNOSIS — E039 Hypothyroidism, unspecified: Secondary | ICD-10-CM | POA: Diagnosis not present

## 2013-05-24 DIAGNOSIS — M48061 Spinal stenosis, lumbar region without neurogenic claudication: Secondary | ICD-10-CM | POA: Diagnosis not present

## 2013-05-24 DIAGNOSIS — M5137 Other intervertebral disc degeneration, lumbosacral region: Secondary | ICD-10-CM | POA: Diagnosis not present

## 2013-05-24 DIAGNOSIS — Z79899 Other long term (current) drug therapy: Secondary | ICD-10-CM | POA: Diagnosis not present

## 2013-05-24 DIAGNOSIS — M5380 Other specified dorsopathies, site unspecified: Secondary | ICD-10-CM | POA: Diagnosis not present

## 2013-05-24 DIAGNOSIS — Z96659 Presence of unspecified artificial knee joint: Secondary | ICD-10-CM | POA: Diagnosis not present

## 2013-05-24 DIAGNOSIS — IMO0001 Reserved for inherently not codable concepts without codable children: Secondary | ICD-10-CM | POA: Diagnosis not present

## 2013-05-24 DIAGNOSIS — K219 Gastro-esophageal reflux disease without esophagitis: Secondary | ICD-10-CM | POA: Diagnosis not present

## 2013-05-28 DIAGNOSIS — I10 Essential (primary) hypertension: Secondary | ICD-10-CM | POA: Diagnosis not present

## 2013-05-28 DIAGNOSIS — N189 Chronic kidney disease, unspecified: Secondary | ICD-10-CM | POA: Diagnosis not present

## 2013-05-28 DIAGNOSIS — M1990 Unspecified osteoarthritis, unspecified site: Secondary | ICD-10-CM | POA: Diagnosis not present

## 2013-05-28 DIAGNOSIS — E039 Hypothyroidism, unspecified: Secondary | ICD-10-CM | POA: Diagnosis not present

## 2013-06-04 DIAGNOSIS — I499 Cardiac arrhythmia, unspecified: Secondary | ICD-10-CM | POA: Diagnosis not present

## 2013-06-04 DIAGNOSIS — I10 Essential (primary) hypertension: Secondary | ICD-10-CM | POA: Diagnosis not present

## 2013-06-04 DIAGNOSIS — M545 Low back pain: Secondary | ICD-10-CM | POA: Diagnosis not present

## 2013-06-04 DIAGNOSIS — M1990 Unspecified osteoarthritis, unspecified site: Secondary | ICD-10-CM | POA: Diagnosis not present

## 2013-06-04 DIAGNOSIS — N189 Chronic kidney disease, unspecified: Secondary | ICD-10-CM | POA: Diagnosis not present

## 2013-06-04 DIAGNOSIS — E039 Hypothyroidism, unspecified: Secondary | ICD-10-CM | POA: Diagnosis not present

## 2013-06-18 ENCOUNTER — Encounter: Payer: Self-pay | Admitting: *Deleted

## 2013-06-18 DIAGNOSIS — E039 Hypothyroidism, unspecified: Secondary | ICD-10-CM

## 2013-06-18 DIAGNOSIS — J019 Acute sinusitis, unspecified: Secondary | ICD-10-CM

## 2013-06-18 DIAGNOSIS — K219 Gastro-esophageal reflux disease without esophagitis: Secondary | ICD-10-CM | POA: Insufficient documentation

## 2013-06-18 DIAGNOSIS — I10 Essential (primary) hypertension: Secondary | ICD-10-CM

## 2013-06-18 DIAGNOSIS — I499 Cardiac arrhythmia, unspecified: Secondary | ICD-10-CM

## 2013-06-18 DIAGNOSIS — M545 Low back pain, unspecified: Secondary | ICD-10-CM

## 2013-06-18 DIAGNOSIS — N189 Chronic kidney disease, unspecified: Secondary | ICD-10-CM

## 2013-06-18 DIAGNOSIS — M1990 Unspecified osteoarthritis, unspecified site: Secondary | ICD-10-CM

## 2013-06-19 ENCOUNTER — Encounter: Payer: Self-pay | Admitting: *Deleted

## 2013-06-19 ENCOUNTER — Ambulatory Visit (INDEPENDENT_AMBULATORY_CARE_PROVIDER_SITE_OTHER): Payer: Medicare Other | Admitting: Cardiovascular Disease

## 2013-06-19 ENCOUNTER — Encounter: Payer: Self-pay | Admitting: Cardiovascular Disease

## 2013-06-19 VITALS — BP 148/81 | HR 75 | Ht 66.0 in | Wt 188.0 lb

## 2013-06-19 DIAGNOSIS — M5137 Other intervertebral disc degeneration, lumbosacral region: Secondary | ICD-10-CM | POA: Diagnosis not present

## 2013-06-19 DIAGNOSIS — I499 Cardiac arrhythmia, unspecified: Secondary | ICD-10-CM

## 2013-06-19 DIAGNOSIS — I10 Essential (primary) hypertension: Secondary | ICD-10-CM | POA: Diagnosis not present

## 2013-06-19 DIAGNOSIS — M5380 Other specified dorsopathies, site unspecified: Secondary | ICD-10-CM | POA: Diagnosis not present

## 2013-06-19 DIAGNOSIS — M47817 Spondylosis without myelopathy or radiculopathy, lumbosacral region: Secondary | ICD-10-CM | POA: Diagnosis not present

## 2013-06-19 DIAGNOSIS — M48061 Spinal stenosis, lumbar region without neurogenic claudication: Secondary | ICD-10-CM | POA: Diagnosis not present

## 2013-06-19 NOTE — Patient Instructions (Addendum)
Your physician recommends that you schedule a follow-up appointment in: ONE YEAR  Your physician has requested that you have an echocardiogram. Echocardiography is a painless test that uses sound waves to create images of your heart. It provides your doctor with information about the size and shape of your heart and how well your heart's chambers and valves are working. This procedure takes approximately one hour. There are no restrictions for this procedure.  Your physician recommends that you return for lab work in: TODAY (SLIPS GIVEN FOR MAGNESIUM)

## 2013-06-19 NOTE — Progress Notes (Signed)
Patient ID: Scott [name], male   DOB: 1928/06/01, 79 y.o.   MRN: [id number]    CARDIOLOGY CONSULT NOTE  Patient ID: Scott [name] MRN: [id number] DOB/AGE: 1928/07/04 79 y.o.    HPI: Scott [name] has a h/o HTN and hypothyroidism, who is referred today for palpitations. He says the palpitations don't really bother him, and denies chest pain, shortness of breath, lightheadedness, and syncope. What bothered him was that his BP machine couldn't register his pulse.  His ECG shows normal sinus rhythm with PAC's and PVC's, with a possible septal infarct (age indeterminate). His last stress test was roughly 15 years ago.  He is able to carry out his ADL's without any limitations, other than dealing with sciatic nerve pain.   Review of systems complete and found to be negative unless listed above in HPI  Past Medical History:   Family History  Problem Relation Age of Onset  . Coronary artery disease Mother     History   Social History  . Marital Status: Married    Spouse Name: N/A    Number of Children: N/A  . Years of Education: N/A   Occupational History  . Not on file.   Social History Main Topics  . Smoking status: Former Smoker    Quit date: 08/17/1989  . Smokeless tobacco: Not on file  . Alcohol Use: Not on file  . Drug Use: Not on file  . Sexual Activity: Not on file   Other Topics Concern  . Not on file   Social History Narrative  . No narrative on file      (Not in a hospital admission)  Physical exam Blood pressure 148/81, pulse 75, height 5\' 6"  (1.676 m), weight 188 lb (85.276 kg). General: NAD Neck: No JVD, no thyromegaly or thyroid nodule.  Lungs: Clear to auscultation bilaterally with normal respiratory effort. CV: Nondisplaced PMI.  Heart regular rate, regular rhythm with premature contractions noted, normal S1/S2, no S3/S4, no murmur.  No peripheral edema.  No carotid bruit.  Normal pedal pulses.  Abdomen: Soft, nontender, no  hepatosplenomegaly, no distention.  Skin: Intact without lesions or rashes.  Neurologic: Alert and oriented x 3.  Psych: Normal affect. Extremities: No clubbing or cyanosis.  HEENT: Normal.   Labs:   Lab Results  Component Value Date   WBC 11.8* 09/29/2008   HGB 10.5* 09/29/2008   HCT 31.4* 09/29/2008   MCV 94.3 09/29/2008   PLT 122* 09/29/2008   No results found for this basename: NA, K, CL, CO2, BUN, CREATININE, CALCIUM, LABALBU, PROT, BILITOT, ALKPHOS, ALT, AST, GLUCOSE,  in the last 168 hours No results found for this basename: CKTOTAL, CKMB, CKMBINDEX, TROPONINI    No results found for this basename: CHOL   No results found for this basename: HDL   No results found for this basename: LDLCALC   No results found for this basename: TRIG   No results found for this basename: CHOLHDL   No results found for this basename: LDLDIRECT            ASSESSMENT AND PLAN:  1. Premature atrial and ventricular contractions-his potassium was normal when checked earlier in August. I will check a magnesium level as well, and obtain an echocardiogram to evaluate for structural heart disease. As he is asymptomatic, specifically denying any dizziness or near-syncope, I am not inclined to have him wear a monitor at this time. I did tell him that if he should develop these symptoms, I would then  have him wear one to evaluate for sinus pauses or bradyarrhythmias. However, there is no indication to do so at this time. I do not feel stress testing is warranted at this time either.  Signed: Prentice Docker, M.D., F.A.C.C. 06/19/2013, 9:12 AM

## 2013-06-20 ENCOUNTER — Encounter: Payer: Self-pay | Admitting: *Deleted

## 2013-06-20 ENCOUNTER — Encounter: Payer: Self-pay | Admitting: Cardiovascular Disease

## 2013-07-24 DIAGNOSIS — M47817 Spondylosis without myelopathy or radiculopathy, lumbosacral region: Secondary | ICD-10-CM | POA: Diagnosis not present

## 2013-08-01 DIAGNOSIS — Z23 Encounter for immunization: Secondary | ICD-10-CM | POA: Diagnosis not present

## 2013-08-12 ENCOUNTER — Ambulatory Visit (HOSPITAL_COMMUNITY): Payer: Medicare Other | Attending: Cardiovascular Disease

## 2013-08-16 DIAGNOSIS — Z01818 Encounter for other preprocedural examination: Secondary | ICD-10-CM | POA: Diagnosis not present

## 2013-08-16 DIAGNOSIS — K219 Gastro-esophageal reflux disease without esophagitis: Secondary | ICD-10-CM | POA: Diagnosis not present

## 2013-08-16 DIAGNOSIS — Z79899 Other long term (current) drug therapy: Secondary | ICD-10-CM | POA: Diagnosis not present

## 2013-08-16 DIAGNOSIS — E039 Hypothyroidism, unspecified: Secondary | ICD-10-CM | POA: Diagnosis not present

## 2013-08-16 DIAGNOSIS — J984 Other disorders of lung: Secondary | ICD-10-CM | POA: Diagnosis not present

## 2013-08-16 DIAGNOSIS — M47817 Spondylosis without myelopathy or radiculopathy, lumbosacral region: Secondary | ICD-10-CM | POA: Diagnosis not present

## 2013-08-16 DIAGNOSIS — J449 Chronic obstructive pulmonary disease, unspecified: Secondary | ICD-10-CM | POA: Diagnosis not present

## 2013-08-16 DIAGNOSIS — I10 Essential (primary) hypertension: Secondary | ICD-10-CM | POA: Diagnosis not present

## 2013-08-17 HISTORY — PX: BACK SURGERY: SHX140

## 2013-08-20 DIAGNOSIS — M47817 Spondylosis without myelopathy or radiculopathy, lumbosacral region: Secondary | ICD-10-CM | POA: Diagnosis not present

## 2013-08-20 DIAGNOSIS — E039 Hypothyroidism, unspecified: Secondary | ICD-10-CM | POA: Diagnosis not present

## 2013-08-20 DIAGNOSIS — Z79899 Other long term (current) drug therapy: Secondary | ICD-10-CM | POA: Diagnosis not present

## 2013-08-20 DIAGNOSIS — M539 Dorsopathy, unspecified: Secondary | ICD-10-CM | POA: Diagnosis not present

## 2013-08-20 DIAGNOSIS — M48061 Spinal stenosis, lumbar region without neurogenic claudication: Secondary | ICD-10-CM | POA: Diagnosis not present

## 2013-08-20 DIAGNOSIS — I10 Essential (primary) hypertension: Secondary | ICD-10-CM | POA: Diagnosis not present

## 2013-08-20 DIAGNOSIS — K219 Gastro-esophageal reflux disease without esophagitis: Secondary | ICD-10-CM | POA: Diagnosis not present

## 2013-09-05 DIAGNOSIS — I10 Essential (primary) hypertension: Secondary | ICD-10-CM | POA: Diagnosis not present

## 2013-09-05 DIAGNOSIS — N189 Chronic kidney disease, unspecified: Secondary | ICD-10-CM | POA: Diagnosis not present

## 2013-09-05 DIAGNOSIS — E039 Hypothyroidism, unspecified: Secondary | ICD-10-CM | POA: Diagnosis not present

## 2013-09-05 DIAGNOSIS — M545 Low back pain: Secondary | ICD-10-CM | POA: Diagnosis not present

## 2013-09-05 DIAGNOSIS — M1990 Unspecified osteoarthritis, unspecified site: Secondary | ICD-10-CM | POA: Diagnosis not present

## 2013-09-05 DIAGNOSIS — I499 Cardiac arrhythmia, unspecified: Secondary | ICD-10-CM | POA: Diagnosis not present

## 2013-09-06 ENCOUNTER — Ambulatory Visit (HOSPITAL_COMMUNITY)
Admission: RE | Admit: 2013-09-06 | Discharge: 2013-09-06 | Disposition: A | Discharge status: Home / Self Care | Payer: Medicare Other | Source: Ambulatory Visit | Attending: Cardiovascular Disease | Admitting: Cardiovascular Disease

## 2013-09-06 DIAGNOSIS — I10 Essential (primary) hypertension: Secondary | ICD-10-CM | POA: Diagnosis not present

## 2013-09-06 DIAGNOSIS — R002 Palpitations: Secondary | ICD-10-CM | POA: Insufficient documentation

## 2013-09-06 DIAGNOSIS — Z87891 Personal history of nicotine dependence: Secondary | ICD-10-CM | POA: Diagnosis not present

## 2013-09-06 DIAGNOSIS — I517 Cardiomegaly: Secondary | ICD-10-CM

## 2013-09-06 DIAGNOSIS — I499 Cardiac arrhythmia, unspecified: Secondary | ICD-10-CM

## 2013-09-06 NOTE — Progress Notes (Signed)
*  PRELIMINARY RESULTS* Echocardiogram 2D Echocardiogram has been performed.  Scott [name] 09/06/2013, 2:36 PM

## 2013-10-21 DIAGNOSIS — M25559 Pain in unspecified hip: Secondary | ICD-10-CM | POA: Diagnosis not present

## 2013-10-21 DIAGNOSIS — M545 Low back pain, unspecified: Secondary | ICD-10-CM | POA: Diagnosis not present

## 2013-10-21 DIAGNOSIS — M5137 Other intervertebral disc degeneration, lumbosacral region: Secondary | ICD-10-CM | POA: Diagnosis not present

## 2013-10-23 DIAGNOSIS — M5137 Other intervertebral disc degeneration, lumbosacral region: Secondary | ICD-10-CM | POA: Diagnosis not present

## 2013-10-23 DIAGNOSIS — M545 Low back pain, unspecified: Secondary | ICD-10-CM | POA: Diagnosis not present

## 2013-10-23 DIAGNOSIS — M25559 Pain in unspecified hip: Secondary | ICD-10-CM | POA: Diagnosis not present

## 2013-10-25 DIAGNOSIS — M25559 Pain in unspecified hip: Secondary | ICD-10-CM | POA: Diagnosis not present

## 2013-10-25 DIAGNOSIS — M545 Low back pain, unspecified: Secondary | ICD-10-CM | POA: Diagnosis not present

## 2013-10-25 DIAGNOSIS — M5137 Other intervertebral disc degeneration, lumbosacral region: Secondary | ICD-10-CM | POA: Diagnosis not present

## 2013-11-04 DIAGNOSIS — M25559 Pain in unspecified hip: Secondary | ICD-10-CM | POA: Diagnosis not present

## 2013-11-04 DIAGNOSIS — M545 Low back pain, unspecified: Secondary | ICD-10-CM | POA: Diagnosis not present

## 2013-11-04 DIAGNOSIS — M5137 Other intervertebral disc degeneration, lumbosacral region: Secondary | ICD-10-CM | POA: Diagnosis not present

## 2013-11-06 DIAGNOSIS — M5137 Other intervertebral disc degeneration, lumbosacral region: Secondary | ICD-10-CM | POA: Diagnosis not present

## 2013-11-06 DIAGNOSIS — M545 Low back pain, unspecified: Secondary | ICD-10-CM | POA: Diagnosis not present

## 2013-11-06 DIAGNOSIS — M25559 Pain in unspecified hip: Secondary | ICD-10-CM | POA: Diagnosis not present

## 2013-11-08 DIAGNOSIS — M25559 Pain in unspecified hip: Secondary | ICD-10-CM | POA: Diagnosis not present

## 2013-11-08 DIAGNOSIS — M545 Low back pain, unspecified: Secondary | ICD-10-CM | POA: Diagnosis not present

## 2013-11-08 DIAGNOSIS — M5137 Other intervertebral disc degeneration, lumbosacral region: Secondary | ICD-10-CM | POA: Diagnosis not present

## 2013-11-12 DIAGNOSIS — M545 Low back pain, unspecified: Secondary | ICD-10-CM | POA: Diagnosis not present

## 2013-11-12 DIAGNOSIS — M25559 Pain in unspecified hip: Secondary | ICD-10-CM | POA: Diagnosis not present

## 2013-11-12 DIAGNOSIS — M5137 Other intervertebral disc degeneration, lumbosacral region: Secondary | ICD-10-CM | POA: Diagnosis not present

## 2013-11-13 DIAGNOSIS — M5137 Other intervertebral disc degeneration, lumbosacral region: Secondary | ICD-10-CM | POA: Diagnosis not present

## 2013-11-13 DIAGNOSIS — M25559 Pain in unspecified hip: Secondary | ICD-10-CM | POA: Diagnosis not present

## 2013-11-13 DIAGNOSIS — M545 Low back pain, unspecified: Secondary | ICD-10-CM | POA: Diagnosis not present

## 2013-12-02 DIAGNOSIS — H04129 Dry eye syndrome of unspecified lacrimal gland: Secondary | ICD-10-CM | POA: Diagnosis not present

## 2013-12-02 DIAGNOSIS — Z961 Presence of intraocular lens: Secondary | ICD-10-CM | POA: Diagnosis not present

## 2014-01-29 DIAGNOSIS — M47817 Spondylosis without myelopathy or radiculopathy, lumbosacral region: Secondary | ICD-10-CM | POA: Diagnosis not present

## 2014-01-29 DIAGNOSIS — M5137 Other intervertebral disc degeneration, lumbosacral region: Secondary | ICD-10-CM | POA: Diagnosis not present

## 2014-01-29 DIAGNOSIS — M5380 Other specified dorsopathies, site unspecified: Secondary | ICD-10-CM | POA: Diagnosis not present

## 2014-02-03 DIAGNOSIS — M5137 Other intervertebral disc degeneration, lumbosacral region: Secondary | ICD-10-CM | POA: Diagnosis not present

## 2014-02-04 DIAGNOSIS — I10 Essential (primary) hypertension: Secondary | ICD-10-CM | POA: Diagnosis not present

## 2014-02-04 DIAGNOSIS — I499 Cardiac arrhythmia, unspecified: Secondary | ICD-10-CM | POA: Diagnosis not present

## 2014-02-04 DIAGNOSIS — M48061 Spinal stenosis, lumbar region without neurogenic claudication: Secondary | ICD-10-CM | POA: Diagnosis not present

## 2014-02-04 DIAGNOSIS — Z9889 Other specified postprocedural states: Secondary | ICD-10-CM | POA: Diagnosis not present

## 2014-02-04 DIAGNOSIS — M5137 Other intervertebral disc degeneration, lumbosacral region: Secondary | ICD-10-CM | POA: Diagnosis not present

## 2014-02-04 DIAGNOSIS — M1990 Unspecified osteoarthritis, unspecified site: Secondary | ICD-10-CM | POA: Diagnosis not present

## 2014-02-04 DIAGNOSIS — M47817 Spondylosis without myelopathy or radiculopathy, lumbosacral region: Secondary | ICD-10-CM | POA: Diagnosis not present

## 2014-02-04 DIAGNOSIS — E039 Hypothyroidism, unspecified: Secondary | ICD-10-CM | POA: Diagnosis not present

## 2014-02-04 DIAGNOSIS — Z85820 Personal history of malignant melanoma of skin: Secondary | ICD-10-CM | POA: Diagnosis not present

## 2014-02-04 DIAGNOSIS — N189 Chronic kidney disease, unspecified: Secondary | ICD-10-CM | POA: Diagnosis not present

## 2014-02-05 DIAGNOSIS — H26499 Other secondary cataract, unspecified eye: Secondary | ICD-10-CM | POA: Diagnosis not present

## 2014-02-11 DIAGNOSIS — E039 Hypothyroidism, unspecified: Secondary | ICD-10-CM | POA: Diagnosis not present

## 2014-02-11 DIAGNOSIS — M1990 Unspecified osteoarthritis, unspecified site: Secondary | ICD-10-CM | POA: Diagnosis not present

## 2014-02-11 DIAGNOSIS — M545 Low back pain, unspecified: Secondary | ICD-10-CM | POA: Diagnosis not present

## 2014-02-11 DIAGNOSIS — N189 Chronic kidney disease, unspecified: Secondary | ICD-10-CM | POA: Diagnosis not present

## 2014-02-11 DIAGNOSIS — I10 Essential (primary) hypertension: Secondary | ICD-10-CM | POA: Diagnosis not present

## 2014-02-11 DIAGNOSIS — I499 Cardiac arrhythmia, unspecified: Secondary | ICD-10-CM | POA: Diagnosis not present

## 2014-02-19 DIAGNOSIS — M5137 Other intervertebral disc degeneration, lumbosacral region: Secondary | ICD-10-CM | POA: Diagnosis not present

## 2014-02-19 DIAGNOSIS — M5380 Other specified dorsopathies, site unspecified: Secondary | ICD-10-CM | POA: Diagnosis not present

## 2014-02-19 DIAGNOSIS — M47817 Spondylosis without myelopathy or radiculopathy, lumbosacral region: Secondary | ICD-10-CM | POA: Diagnosis not present

## 2014-02-21 DIAGNOSIS — H26499 Other secondary cataract, unspecified eye: Secondary | ICD-10-CM | POA: Diagnosis not present

## 2014-02-24 DIAGNOSIS — M1990 Unspecified osteoarthritis, unspecified site: Secondary | ICD-10-CM | POA: Diagnosis not present

## 2014-02-24 DIAGNOSIS — I10 Essential (primary) hypertension: Secondary | ICD-10-CM | POA: Diagnosis not present

## 2014-02-24 DIAGNOSIS — E039 Hypothyroidism, unspecified: Secondary | ICD-10-CM | POA: Diagnosis not present

## 2014-02-24 DIAGNOSIS — N189 Chronic kidney disease, unspecified: Secondary | ICD-10-CM | POA: Diagnosis not present

## 2014-02-24 DIAGNOSIS — I499 Cardiac arrhythmia, unspecified: Secondary | ICD-10-CM | POA: Diagnosis not present

## 2014-02-24 DIAGNOSIS — R42 Dizziness and giddiness: Secondary | ICD-10-CM | POA: Diagnosis not present

## 2014-02-25 DIAGNOSIS — I10 Essential (primary) hypertension: Secondary | ICD-10-CM | POA: Diagnosis not present

## 2014-02-25 DIAGNOSIS — R42 Dizziness and giddiness: Secondary | ICD-10-CM | POA: Diagnosis not present

## 2014-02-25 DIAGNOSIS — R269 Unspecified abnormalities of gait and mobility: Secondary | ICD-10-CM | POA: Diagnosis not present

## 2014-03-19 DIAGNOSIS — E039 Hypothyroidism, unspecified: Secondary | ICD-10-CM | POA: Diagnosis not present

## 2014-03-19 DIAGNOSIS — M1990 Unspecified osteoarthritis, unspecified site: Secondary | ICD-10-CM | POA: Diagnosis not present

## 2014-03-19 DIAGNOSIS — N189 Chronic kidney disease, unspecified: Secondary | ICD-10-CM | POA: Diagnosis not present

## 2014-03-19 DIAGNOSIS — I499 Cardiac arrhythmia, unspecified: Secondary | ICD-10-CM | POA: Diagnosis not present

## 2014-03-19 DIAGNOSIS — R42 Dizziness and giddiness: Secondary | ICD-10-CM | POA: Diagnosis not present

## 2014-03-19 DIAGNOSIS — I10 Essential (primary) hypertension: Secondary | ICD-10-CM | POA: Diagnosis not present

## 2014-03-26 DIAGNOSIS — Z6830 Body mass index (BMI) 30.0-30.9, adult: Secondary | ICD-10-CM | POA: Diagnosis not present

## 2014-03-26 DIAGNOSIS — M47817 Spondylosis without myelopathy or radiculopathy, lumbosacral region: Secondary | ICD-10-CM | POA: Diagnosis not present

## 2014-04-04 DIAGNOSIS — IMO0002 Reserved for concepts with insufficient information to code with codable children: Secondary | ICD-10-CM | POA: Diagnosis not present

## 2014-04-04 DIAGNOSIS — M48061 Spinal stenosis, lumbar region without neurogenic claudication: Secondary | ICD-10-CM | POA: Diagnosis not present

## 2014-04-09 DIAGNOSIS — M48061 Spinal stenosis, lumbar region without neurogenic claudication: Secondary | ICD-10-CM | POA: Diagnosis not present

## 2014-04-09 DIAGNOSIS — IMO0002 Reserved for concepts with insufficient information to code with codable children: Secondary | ICD-10-CM | POA: Diagnosis not present

## 2014-04-22 DIAGNOSIS — I499 Cardiac arrhythmia, unspecified: Secondary | ICD-10-CM | POA: Diagnosis not present

## 2014-04-22 DIAGNOSIS — M1990 Unspecified osteoarthritis, unspecified site: Secondary | ICD-10-CM | POA: Diagnosis not present

## 2014-04-22 DIAGNOSIS — I10 Essential (primary) hypertension: Secondary | ICD-10-CM | POA: Diagnosis not present

## 2014-04-22 DIAGNOSIS — R42 Dizziness and giddiness: Secondary | ICD-10-CM | POA: Diagnosis not present

## 2014-04-22 DIAGNOSIS — E039 Hypothyroidism, unspecified: Secondary | ICD-10-CM | POA: Diagnosis not present

## 2014-04-22 DIAGNOSIS — N189 Chronic kidney disease, unspecified: Secondary | ICD-10-CM | POA: Diagnosis not present

## 2014-04-29 DIAGNOSIS — M47817 Spondylosis without myelopathy or radiculopathy, lumbosacral region: Secondary | ICD-10-CM | POA: Diagnosis not present

## 2014-04-29 DIAGNOSIS — M5380 Other specified dorsopathies, site unspecified: Secondary | ICD-10-CM | POA: Diagnosis not present

## 2014-04-29 DIAGNOSIS — M48061 Spinal stenosis, lumbar region without neurogenic claudication: Secondary | ICD-10-CM | POA: Diagnosis not present

## 2014-04-29 DIAGNOSIS — M5137 Other intervertebral disc degeneration, lumbosacral region: Secondary | ICD-10-CM | POA: Diagnosis not present

## 2014-04-30 DIAGNOSIS — IMO0002 Reserved for concepts with insufficient information to code with codable children: Secondary | ICD-10-CM | POA: Diagnosis not present

## 2014-04-30 DIAGNOSIS — M48061 Spinal stenosis, lumbar region without neurogenic claudication: Secondary | ICD-10-CM | POA: Diagnosis not present

## 2014-06-11 DIAGNOSIS — M48061 Spinal stenosis, lumbar region without neurogenic claudication: Secondary | ICD-10-CM | POA: Diagnosis not present

## 2014-06-11 DIAGNOSIS — IMO0002 Reserved for concepts with insufficient information to code with codable children: Secondary | ICD-10-CM | POA: Diagnosis not present

## 2014-06-17 HISTORY — PX: LUMBAR FUSION: SHX111

## 2014-06-25 ENCOUNTER — Encounter: Payer: Self-pay | Admitting: Cardiovascular Disease

## 2014-06-25 ENCOUNTER — Ambulatory Visit (INDEPENDENT_AMBULATORY_CARE_PROVIDER_SITE_OTHER): Payer: Medicare Other | Admitting: Cardiovascular Disease

## 2014-06-25 VITALS — BP 162/81 | HR 66 | Ht 66.0 in | Wt 187.0 lb

## 2014-06-25 DIAGNOSIS — I10 Essential (primary) hypertension: Secondary | ICD-10-CM | POA: Diagnosis not present

## 2014-06-25 DIAGNOSIS — I4949 Other premature depolarization: Secondary | ICD-10-CM | POA: Diagnosis not present

## 2014-06-25 DIAGNOSIS — I499 Cardiac arrhythmia, unspecified: Secondary | ICD-10-CM | POA: Diagnosis not present

## 2014-06-25 DIAGNOSIS — I493 Ventricular premature depolarization: Secondary | ICD-10-CM | POA: Insufficient documentation

## 2014-06-25 NOTE — Patient Instructions (Signed)
Continue all current medications. Your physician wants you to follow up in:  1 year.  You will receive a reminder letter in the mail one-two months in advance.  If you don't receive a letter, please call our office to schedule the follow up appointment   

## 2014-06-25 NOTE — Progress Notes (Signed)
Patient ID: Scott [name], male   DOB: 30-Jul-1928, 80 y.o.   MRN: [id number]      SUBJECTIVE: The patient is here to followup for premature atrial and ventricular contractions. Echocardiography in November 2014 demonstrated normal left ventricular systolic function, EF 40-98%, with grade 1 diastolic dysfunction and PA pressures of 42 mm mercury. He remains asymptomatic, specifically denying chest pain and palpitations. He occasionally has some mild shortness of breath which he attributes to cardiovascular deconditioning as he does not get much activity as he is limited by back and leg pain. He underwent a back surgery since he last saw me and did not have any problems. He is scheduled to undergo another back surgery in the near future. ECG performed in the office today demonstrates normal sinus rhythm with frequent PVCs with a ventricular couplet noted, heart rate 80 beats per minute.  Review of Systems: As per "subjective", otherwise negative.  Allergies  Allergen Reactions  . Tramadol     Pt noticed elevated blood pressure when taking.    Current Outpatient Prescriptions  Medication Sig Dispense Refill  . amLODipine (NORVASC) 2.5 MG tablet Take 2.5 mg by mouth daily.      Marland Kitchen doxazosin (CARDURA) 8 MG tablet Take 8 mg by mouth at bedtime.      . gabapentin (NEURONTIN) 300 MG capsule Take 300 mg by mouth 2 (two) times daily.      Marland Kitchen labetalol (NORMODYNE) 200 MG tablet Take 200 mg by mouth 2 (two) times daily.      Marland Kitchen levothyroxine (SYNTHROID, LEVOTHROID) 137 MCG tablet Take 137 mcg by mouth daily before breakfast.      . lisinopril (PRINIVIL,ZESTRIL) 40 MG tablet Take 40 mg by mouth daily.      . naproxen sodium (ANAPROX) 275 MG tablet Take 275 mg by mouth 4 (four) times daily. Take 2 (500mg  total) twice a day      . omeprazole (PRILOSEC) 20 MG capsule Take 20 mg by mouth daily.      . tamsulosin (FLOMAX) 0.4 MG CAPS capsule Take 0.4 mg by mouth daily.       No current  facility-administered medications for this visit.    Past Medical History  Diagnosis Date  . Hypertension   . Hyperlipidemia   . Hypothyroidism   . Arrhythmia   . Chronic kidney disease     Past Surgical History  Procedure Laterality Date  . Colonoscopy    . Knee surgery    . Cataract extraction      History   Social History  . Marital Status: Married    Spouse Name: N/A    Number of Children: N/A  . Years of Education: N/A   Occupational History  . Not on file.   Social History Main Topics  . Smoking status: Former Smoker    Start date: 10/17/1965    Quit date: 10/17/1978  . Smokeless tobacco: Not on file  . Alcohol Use: No  . Drug Use: No  . Sexual Activity: Not on file   Other Topics Concern  . Not on file   Social History Narrative  . No narrative on file     Filed Vitals:   06/25/14 1449  BP: 162/81  Pulse: 66  Height: 5\' 6"  (1.676 m)  Weight: 187 lb (84.823 kg)  SpO2: 99%    PHYSICAL EXAM General: NAD HEENT: Normal. Neck: No JVD, no thyromegaly. Lungs: Clear to auscultation bilaterally with normal respiratory effort. CV: Nondisplaced PMI.  Regular  rate and rhythm with premature contractions, normal S1/S2, no S3/S4, no murmur. No pretibial or periankle edema.  No carotid bruit.  Normal pedal pulses.  Abdomen: Soft, nontender, no hepatosplenomegaly, no distention.  Neurologic: Alert and oriented x 3.  Psych: Normal affect. Skin: Normal. Musculoskeletal: Normal range of motion, no gross deformities. Extremities: No clubbing or cyanosis.   ECG: Most recent ECG reviewed.      ASSESSMENT AND PLAN: 1. PAC's/PVC's: Asymptomatic and stable. Normal LV systolic function. No indication for further testing or medication initiation at this time. 2. Essential HTN: If this remains elevated, I would recommend increasing amlodipine to 5 mg daily.  Dispo: f/u 1 year.  Kate Sable, M.D., F.A.C.C.

## 2014-06-26 DIAGNOSIS — Z23 Encounter for immunization: Secondary | ICD-10-CM | POA: Diagnosis not present

## 2014-07-09 DIAGNOSIS — IMO0002 Reserved for concepts with insufficient information to code with codable children: Secondary | ICD-10-CM | POA: Diagnosis not present

## 2014-07-09 DIAGNOSIS — Z981 Arthrodesis status: Secondary | ICD-10-CM | POA: Diagnosis not present

## 2014-07-09 DIAGNOSIS — M5136 Other intervertebral disc degeneration, lumbar region: Secondary | ICD-10-CM | POA: Diagnosis not present

## 2014-07-09 DIAGNOSIS — M4800 Spinal stenosis, site unspecified: Secondary | ICD-10-CM | POA: Diagnosis not present

## 2014-07-09 DIAGNOSIS — Z886 Allergy status to analgesic agent status: Secondary | ICD-10-CM | POA: Diagnosis not present

## 2014-07-09 DIAGNOSIS — M48061 Spinal stenosis, lumbar region without neurogenic claudication: Secondary | ICD-10-CM | POA: Diagnosis not present

## 2014-07-09 DIAGNOSIS — I10 Essential (primary) hypertension: Secondary | ICD-10-CM | POA: Diagnosis present

## 2014-07-09 DIAGNOSIS — K5900 Constipation, unspecified: Secondary | ICD-10-CM | POA: Diagnosis not present

## 2014-07-09 DIAGNOSIS — Z01818 Encounter for other preprocedural examination: Secondary | ICD-10-CM | POA: Diagnosis not present

## 2014-07-09 DIAGNOSIS — D696 Thrombocytopenia, unspecified: Secondary | ICD-10-CM | POA: Diagnosis not present

## 2014-07-09 DIAGNOSIS — R262 Difficulty in walking, not elsewhere classified: Secondary | ICD-10-CM | POA: Diagnosis not present

## 2014-07-09 DIAGNOSIS — K219 Gastro-esophageal reflux disease without esophagitis: Secondary | ICD-10-CM | POA: Diagnosis present

## 2014-07-09 DIAGNOSIS — M6281 Muscle weakness (generalized): Secondary | ICD-10-CM | POA: Diagnosis not present

## 2014-07-09 DIAGNOSIS — M5137 Other intervertebral disc degeneration, lumbosacral region: Secondary | ICD-10-CM | POA: Diagnosis not present

## 2014-07-09 DIAGNOSIS — T446X5A Adverse effect of alpha-adrenoreceptor antagonists, initial encounter: Secondary | ICD-10-CM | POA: Diagnosis not present

## 2014-07-09 DIAGNOSIS — Z4789 Encounter for other orthopedic aftercare: Secondary | ICD-10-CM | POA: Diagnosis not present

## 2014-07-09 DIAGNOSIS — E039 Hypothyroidism, unspecified: Secondary | ICD-10-CM | POA: Diagnosis present

## 2014-07-09 DIAGNOSIS — N19 Unspecified kidney failure: Secondary | ICD-10-CM | POA: Diagnosis not present

## 2014-07-09 DIAGNOSIS — M47817 Spondylosis without myelopathy or radiculopathy, lumbosacral region: Secondary | ICD-10-CM | POA: Diagnosis not present

## 2014-07-09 DIAGNOSIS — R55 Syncope and collapse: Secondary | ICD-10-CM | POA: Diagnosis not present

## 2014-07-09 DIAGNOSIS — Z79899 Other long term (current) drug therapy: Secondary | ICD-10-CM | POA: Diagnosis not present

## 2014-07-09 DIAGNOSIS — M47816 Spondylosis without myelopathy or radiculopathy, lumbar region: Secondary | ICD-10-CM | POA: Diagnosis present

## 2014-07-09 DIAGNOSIS — J984 Other disorders of lung: Secondary | ICD-10-CM | POA: Diagnosis not present

## 2014-07-09 DIAGNOSIS — M51379 Other intervertebral disc degeneration, lumbosacral region without mention of lumbar back pain or lower extremity pain: Secondary | ICD-10-CM | POA: Diagnosis not present

## 2014-07-09 DIAGNOSIS — M4727 Other spondylosis with radiculopathy, lumbosacral region: Secondary | ICD-10-CM | POA: Diagnosis not present

## 2014-07-14 DIAGNOSIS — IMO0002 Reserved for concepts with insufficient information to code with codable children: Secondary | ICD-10-CM | POA: Diagnosis not present

## 2014-07-18 DIAGNOSIS — M4306 Spondylolysis, lumbar region: Secondary | ICD-10-CM | POA: Diagnosis not present

## 2014-07-18 DIAGNOSIS — K219 Gastro-esophageal reflux disease without esophagitis: Secondary | ICD-10-CM | POA: Diagnosis not present

## 2014-07-18 DIAGNOSIS — Z4789 Encounter for other orthopedic aftercare: Secondary | ICD-10-CM | POA: Diagnosis not present

## 2014-07-18 DIAGNOSIS — I10 Essential (primary) hypertension: Secondary | ICD-10-CM | POA: Diagnosis not present

## 2014-07-18 DIAGNOSIS — M5136 Other intervertebral disc degeneration, lumbar region: Secondary | ICD-10-CM | POA: Diagnosis not present

## 2014-07-18 DIAGNOSIS — K5900 Constipation, unspecified: Secondary | ICD-10-CM | POA: Diagnosis not present

## 2014-07-18 DIAGNOSIS — M6281 Muscle weakness (generalized): Secondary | ICD-10-CM | POA: Diagnosis not present

## 2014-07-18 DIAGNOSIS — R5381 Other malaise: Secondary | ICD-10-CM | POA: Diagnosis not present

## 2014-07-18 DIAGNOSIS — R262 Difficulty in walking, not elsewhere classified: Secondary | ICD-10-CM | POA: Diagnosis not present

## 2014-07-18 DIAGNOSIS — E039 Hypothyroidism, unspecified: Secondary | ICD-10-CM | POA: Diagnosis not present

## 2014-07-18 DIAGNOSIS — M4727 Other spondylosis with radiculopathy, lumbosacral region: Secondary | ICD-10-CM | POA: Diagnosis not present

## 2014-07-21 DIAGNOSIS — I10 Essential (primary) hypertension: Secondary | ICD-10-CM | POA: Diagnosis not present

## 2014-07-21 DIAGNOSIS — E039 Hypothyroidism, unspecified: Secondary | ICD-10-CM | POA: Diagnosis not present

## 2014-07-21 DIAGNOSIS — K5900 Constipation, unspecified: Secondary | ICD-10-CM | POA: Diagnosis not present

## 2014-07-21 DIAGNOSIS — M4306 Spondylolysis, lumbar region: Secondary | ICD-10-CM | POA: Diagnosis not present

## 2014-07-23 DIAGNOSIS — E039 Hypothyroidism, unspecified: Secondary | ICD-10-CM | POA: Diagnosis not present

## 2014-07-23 DIAGNOSIS — R5381 Other malaise: Secondary | ICD-10-CM | POA: Diagnosis not present

## 2014-07-23 DIAGNOSIS — I10 Essential (primary) hypertension: Secondary | ICD-10-CM | POA: Diagnosis not present

## 2014-07-23 DIAGNOSIS — K5900 Constipation, unspecified: Secondary | ICD-10-CM | POA: Diagnosis not present

## 2014-07-23 DIAGNOSIS — M4306 Spondylolysis, lumbar region: Secondary | ICD-10-CM | POA: Diagnosis not present

## 2014-07-28 DIAGNOSIS — M5136 Other intervertebral disc degeneration, lumbar region: Secondary | ICD-10-CM | POA: Diagnosis not present

## 2014-08-06 DIAGNOSIS — R5381 Other malaise: Secondary | ICD-10-CM | POA: Diagnosis not present

## 2014-08-06 DIAGNOSIS — K5900 Constipation, unspecified: Secondary | ICD-10-CM | POA: Diagnosis not present

## 2014-08-06 DIAGNOSIS — E039 Hypothyroidism, unspecified: Secondary | ICD-10-CM | POA: Diagnosis not present

## 2014-08-06 DIAGNOSIS — M4306 Spondylolysis, lumbar region: Secondary | ICD-10-CM | POA: Diagnosis not present

## 2014-08-06 DIAGNOSIS — I10 Essential (primary) hypertension: Secondary | ICD-10-CM | POA: Diagnosis not present

## 2014-09-23 DIAGNOSIS — L570 Actinic keratosis: Secondary | ICD-10-CM | POA: Diagnosis not present

## 2014-09-23 DIAGNOSIS — C44319 Basal cell carcinoma of skin of other parts of face: Secondary | ICD-10-CM | POA: Diagnosis not present

## 2014-09-23 DIAGNOSIS — D485 Neoplasm of uncertain behavior of skin: Secondary | ICD-10-CM | POA: Diagnosis not present

## 2014-09-23 DIAGNOSIS — Z85828 Personal history of other malignant neoplasm of skin: Secondary | ICD-10-CM | POA: Diagnosis not present

## 2014-09-29 DIAGNOSIS — M25652 Stiffness of left hip, not elsewhere classified: Secondary | ICD-10-CM | POA: Diagnosis not present

## 2014-09-29 DIAGNOSIS — M545 Low back pain: Secondary | ICD-10-CM | POA: Diagnosis not present

## 2014-09-29 DIAGNOSIS — M47816 Spondylosis without myelopathy or radiculopathy, lumbar region: Secondary | ICD-10-CM | POA: Diagnosis not present

## 2014-09-29 DIAGNOSIS — R262 Difficulty in walking, not elsewhere classified: Secondary | ICD-10-CM | POA: Diagnosis not present

## 2014-10-02 DIAGNOSIS — M25652 Stiffness of left hip, not elsewhere classified: Secondary | ICD-10-CM | POA: Diagnosis not present

## 2014-10-02 DIAGNOSIS — M545 Low back pain: Secondary | ICD-10-CM | POA: Diagnosis not present

## 2014-10-02 DIAGNOSIS — R262 Difficulty in walking, not elsewhere classified: Secondary | ICD-10-CM | POA: Diagnosis not present

## 2014-10-02 DIAGNOSIS — M47816 Spondylosis without myelopathy or radiculopathy, lumbar region: Secondary | ICD-10-CM | POA: Diagnosis not present

## 2014-10-06 DIAGNOSIS — R262 Difficulty in walking, not elsewhere classified: Secondary | ICD-10-CM | POA: Diagnosis not present

## 2014-10-06 DIAGNOSIS — M47816 Spondylosis without myelopathy or radiculopathy, lumbar region: Secondary | ICD-10-CM | POA: Diagnosis not present

## 2014-10-06 DIAGNOSIS — M545 Low back pain: Secondary | ICD-10-CM | POA: Diagnosis not present

## 2014-10-06 DIAGNOSIS — M25652 Stiffness of left hip, not elsewhere classified: Secondary | ICD-10-CM | POA: Diagnosis not present

## 2014-10-07 DIAGNOSIS — C44310 Basal cell carcinoma of skin of unspecified parts of face: Secondary | ICD-10-CM | POA: Diagnosis not present

## 2014-10-07 DIAGNOSIS — L570 Actinic keratosis: Secondary | ICD-10-CM | POA: Diagnosis not present

## 2014-10-08 DIAGNOSIS — M25652 Stiffness of left hip, not elsewhere classified: Secondary | ICD-10-CM | POA: Diagnosis not present

## 2014-10-08 DIAGNOSIS — M545 Low back pain: Secondary | ICD-10-CM | POA: Diagnosis not present

## 2014-10-08 DIAGNOSIS — M47816 Spondylosis without myelopathy or radiculopathy, lumbar region: Secondary | ICD-10-CM | POA: Diagnosis not present

## 2014-10-08 DIAGNOSIS — R262 Difficulty in walking, not elsewhere classified: Secondary | ICD-10-CM | POA: Diagnosis not present

## 2014-10-13 DIAGNOSIS — M47816 Spondylosis without myelopathy or radiculopathy, lumbar region: Secondary | ICD-10-CM | POA: Diagnosis not present

## 2014-10-13 DIAGNOSIS — R262 Difficulty in walking, not elsewhere classified: Secondary | ICD-10-CM | POA: Diagnosis not present

## 2014-10-13 DIAGNOSIS — M25652 Stiffness of left hip, not elsewhere classified: Secondary | ICD-10-CM | POA: Diagnosis not present

## 2014-10-13 DIAGNOSIS — M545 Low back pain: Secondary | ICD-10-CM | POA: Diagnosis not present

## 2014-10-14 DIAGNOSIS — R3915 Urgency of urination: Secondary | ICD-10-CM | POA: Diagnosis not present

## 2014-10-14 DIAGNOSIS — R351 Nocturia: Secondary | ICD-10-CM | POA: Diagnosis not present

## 2014-10-14 DIAGNOSIS — N401 Enlarged prostate with lower urinary tract symptoms: Secondary | ICD-10-CM | POA: Diagnosis not present

## 2014-10-14 DIAGNOSIS — R3919 Other difficulties with micturition: Secondary | ICD-10-CM | POA: Diagnosis not present

## 2014-10-15 DIAGNOSIS — R262 Difficulty in walking, not elsewhere classified: Secondary | ICD-10-CM | POA: Diagnosis not present

## 2014-10-15 DIAGNOSIS — M25652 Stiffness of left hip, not elsewhere classified: Secondary | ICD-10-CM | POA: Diagnosis not present

## 2014-10-15 DIAGNOSIS — M545 Low back pain: Secondary | ICD-10-CM | POA: Diagnosis not present

## 2014-10-15 DIAGNOSIS — M47816 Spondylosis without myelopathy or radiculopathy, lumbar region: Secondary | ICD-10-CM | POA: Diagnosis not present

## 2014-10-21 DIAGNOSIS — M47896 Other spondylosis, lumbar region: Secondary | ICD-10-CM | POA: Diagnosis not present

## 2014-10-21 DIAGNOSIS — M545 Low back pain: Secondary | ICD-10-CM | POA: Diagnosis not present

## 2014-10-21 DIAGNOSIS — R262 Difficulty in walking, not elsewhere classified: Secondary | ICD-10-CM | POA: Diagnosis not present

## 2014-10-21 DIAGNOSIS — M25652 Stiffness of left hip, not elsewhere classified: Secondary | ICD-10-CM | POA: Diagnosis not present

## 2014-10-22 DIAGNOSIS — M160 Bilateral primary osteoarthritis of hip: Secondary | ICD-10-CM | POA: Diagnosis not present

## 2014-10-22 DIAGNOSIS — Z9889 Other specified postprocedural states: Secondary | ICD-10-CM | POA: Diagnosis not present

## 2014-10-31 DIAGNOSIS — F419 Anxiety disorder, unspecified: Secondary | ICD-10-CM

## 2014-10-31 HISTORY — DX: Anxiety disorder, unspecified: F41.9

## 2014-11-05 DIAGNOSIS — M1612 Unilateral primary osteoarthritis, left hip: Secondary | ICD-10-CM | POA: Diagnosis not present

## 2014-11-05 DIAGNOSIS — M25552 Pain in left hip: Secondary | ICD-10-CM | POA: Diagnosis not present

## 2014-11-05 DIAGNOSIS — Z981 Arthrodesis status: Secondary | ICD-10-CM | POA: Diagnosis not present

## 2014-11-11 DIAGNOSIS — R3919 Other difficulties with micturition: Secondary | ICD-10-CM | POA: Diagnosis not present

## 2014-11-11 DIAGNOSIS — R3915 Urgency of urination: Secondary | ICD-10-CM | POA: Diagnosis not present

## 2014-11-11 DIAGNOSIS — N401 Enlarged prostate with lower urinary tract symptoms: Secondary | ICD-10-CM | POA: Diagnosis not present

## 2014-12-10 DIAGNOSIS — M7062 Trochanteric bursitis, left hip: Secondary | ICD-10-CM | POA: Diagnosis not present

## 2014-12-10 DIAGNOSIS — M461 Sacroiliitis, not elsewhere classified: Secondary | ICD-10-CM | POA: Diagnosis not present

## 2014-12-10 DIAGNOSIS — M1612 Unilateral primary osteoarthritis, left hip: Secondary | ICD-10-CM | POA: Diagnosis not present

## 2014-12-10 DIAGNOSIS — M5136 Other intervertebral disc degeneration, lumbar region: Secondary | ICD-10-CM | POA: Diagnosis not present

## 2014-12-22 DIAGNOSIS — E039 Hypothyroidism, unspecified: Secondary | ICD-10-CM | POA: Diagnosis not present

## 2014-12-22 DIAGNOSIS — M4806 Spinal stenosis, lumbar region: Secondary | ICD-10-CM | POA: Diagnosis not present

## 2014-12-22 DIAGNOSIS — Z96653 Presence of artificial knee joint, bilateral: Secondary | ICD-10-CM | POA: Diagnosis not present

## 2014-12-22 DIAGNOSIS — M25511 Pain in right shoulder: Secondary | ICD-10-CM | POA: Diagnosis not present

## 2014-12-22 DIAGNOSIS — M1612 Unilateral primary osteoarthritis, left hip: Secondary | ICD-10-CM | POA: Diagnosis not present

## 2014-12-22 DIAGNOSIS — Z79899 Other long term (current) drug therapy: Secondary | ICD-10-CM | POA: Diagnosis not present

## 2014-12-22 DIAGNOSIS — M7062 Trochanteric bursitis, left hip: Secondary | ICD-10-CM | POA: Diagnosis not present

## 2014-12-22 DIAGNOSIS — K219 Gastro-esophageal reflux disease without esophagitis: Secondary | ICD-10-CM | POA: Diagnosis not present

## 2014-12-22 DIAGNOSIS — M5136 Other intervertebral disc degeneration, lumbar region: Secondary | ICD-10-CM | POA: Diagnosis not present

## 2014-12-22 DIAGNOSIS — M461 Sacroiliitis, not elsewhere classified: Secondary | ICD-10-CM | POA: Diagnosis not present

## 2014-12-22 DIAGNOSIS — I10 Essential (primary) hypertension: Secondary | ICD-10-CM | POA: Diagnosis not present

## 2014-12-22 DIAGNOSIS — G8929 Other chronic pain: Secondary | ICD-10-CM | POA: Diagnosis not present

## 2014-12-22 DIAGNOSIS — M47816 Spondylosis without myelopathy or radiculopathy, lumbar region: Secondary | ICD-10-CM | POA: Diagnosis not present

## 2014-12-22 DIAGNOSIS — M791 Myalgia: Secondary | ICD-10-CM | POA: Diagnosis not present

## 2015-01-08 DIAGNOSIS — M4806 Spinal stenosis, lumbar region: Secondary | ICD-10-CM | POA: Diagnosis not present

## 2015-01-08 DIAGNOSIS — M47816 Spondylosis without myelopathy or radiculopathy, lumbar region: Secondary | ICD-10-CM | POA: Diagnosis not present

## 2015-01-08 DIAGNOSIS — M7061 Trochanteric bursitis, right hip: Secondary | ICD-10-CM | POA: Diagnosis not present

## 2015-01-28 DIAGNOSIS — M461 Sacroiliitis, not elsewhere classified: Secondary | ICD-10-CM | POA: Diagnosis not present

## 2015-01-28 DIAGNOSIS — M47816 Spondylosis without myelopathy or radiculopathy, lumbar region: Secondary | ICD-10-CM | POA: Diagnosis not present

## 2015-01-28 DIAGNOSIS — M545 Low back pain: Secondary | ICD-10-CM | POA: Diagnosis not present

## 2015-01-28 DIAGNOSIS — M5136 Other intervertebral disc degeneration, lumbar region: Secondary | ICD-10-CM | POA: Diagnosis not present

## 2015-02-25 DIAGNOSIS — Z96653 Presence of artificial knee joint, bilateral: Secondary | ICD-10-CM | POA: Diagnosis not present

## 2015-02-25 DIAGNOSIS — M7062 Trochanteric bursitis, left hip: Secondary | ICD-10-CM | POA: Diagnosis not present

## 2015-02-25 DIAGNOSIS — M791 Myalgia: Secondary | ICD-10-CM | POA: Diagnosis not present

## 2015-02-25 DIAGNOSIS — M4806 Spinal stenosis, lumbar region: Secondary | ICD-10-CM | POA: Diagnosis not present

## 2015-02-25 DIAGNOSIS — Z79899 Other long term (current) drug therapy: Secondary | ICD-10-CM | POA: Diagnosis not present

## 2015-02-25 DIAGNOSIS — G8929 Other chronic pain: Secondary | ICD-10-CM | POA: Diagnosis not present

## 2015-02-25 DIAGNOSIS — M47816 Spondylosis without myelopathy or radiculopathy, lumbar region: Secondary | ICD-10-CM | POA: Diagnosis not present

## 2015-02-25 DIAGNOSIS — M25511 Pain in right shoulder: Secondary | ICD-10-CM | POA: Diagnosis not present

## 2015-02-25 DIAGNOSIS — M461 Sacroiliitis, not elsewhere classified: Secondary | ICD-10-CM | POA: Diagnosis not present

## 2015-02-25 DIAGNOSIS — E039 Hypothyroidism, unspecified: Secondary | ICD-10-CM | POA: Diagnosis not present

## 2015-02-25 DIAGNOSIS — M5136 Other intervertebral disc degeneration, lumbar region: Secondary | ICD-10-CM | POA: Diagnosis not present

## 2015-02-25 DIAGNOSIS — I10 Essential (primary) hypertension: Secondary | ICD-10-CM | POA: Diagnosis not present

## 2015-03-06 DIAGNOSIS — R3915 Urgency of urination: Secondary | ICD-10-CM | POA: Diagnosis not present

## 2015-03-18 HISTORY — PX: TRANSURETHRAL RESECTION OF PROSTATE: SHX73

## 2015-03-25 DIAGNOSIS — I10 Essential (primary) hypertension: Secondary | ICD-10-CM | POA: Diagnosis not present

## 2015-03-25 DIAGNOSIS — N419 Inflammatory disease of prostate, unspecified: Secondary | ICD-10-CM | POA: Diagnosis not present

## 2015-03-25 DIAGNOSIS — E039 Hypothyroidism, unspecified: Secondary | ICD-10-CM | POA: Diagnosis not present

## 2015-03-25 DIAGNOSIS — N400 Enlarged prostate without lower urinary tract symptoms: Secondary | ICD-10-CM | POA: Diagnosis not present

## 2015-03-25 DIAGNOSIS — R3915 Urgency of urination: Secondary | ICD-10-CM | POA: Diagnosis not present

## 2015-03-25 DIAGNOSIS — K219 Gastro-esophageal reflux disease without esophagitis: Secondary | ICD-10-CM | POA: Diagnosis not present

## 2015-03-25 DIAGNOSIS — S70361A Insect bite (nonvenomous), right thigh, initial encounter: Secondary | ICD-10-CM | POA: Diagnosis not present

## 2015-03-25 DIAGNOSIS — N401 Enlarged prostate with lower urinary tract symptoms: Secondary | ICD-10-CM | POA: Diagnosis not present

## 2015-03-25 DIAGNOSIS — R3919 Other difficulties with micturition: Secondary | ICD-10-CM | POA: Diagnosis not present

## 2015-03-25 DIAGNOSIS — Z79899 Other long term (current) drug therapy: Secondary | ICD-10-CM | POA: Diagnosis not present

## 2015-03-25 DIAGNOSIS — C61 Malignant neoplasm of prostate: Secondary | ICD-10-CM | POA: Diagnosis not present

## 2015-03-25 DIAGNOSIS — Z96653 Presence of artificial knee joint, bilateral: Secondary | ICD-10-CM | POA: Diagnosis not present

## 2015-03-25 DIAGNOSIS — Z885 Allergy status to narcotic agent status: Secondary | ICD-10-CM | POA: Diagnosis not present

## 2015-03-25 DIAGNOSIS — Z87891 Personal history of nicotine dependence: Secondary | ICD-10-CM | POA: Diagnosis not present

## 2015-03-25 DIAGNOSIS — N411 Chronic prostatitis: Secondary | ICD-10-CM | POA: Diagnosis not present

## 2015-03-25 DIAGNOSIS — Z8249 Family history of ischemic heart disease and other diseases of the circulatory system: Secondary | ICD-10-CM | POA: Diagnosis not present

## 2015-03-25 DIAGNOSIS — M1990 Unspecified osteoarthritis, unspecified site: Secondary | ICD-10-CM | POA: Diagnosis not present

## 2015-04-21 DIAGNOSIS — M7061 Trochanteric bursitis, right hip: Secondary | ICD-10-CM | POA: Diagnosis not present

## 2015-04-21 DIAGNOSIS — M47816 Spondylosis without myelopathy or radiculopathy, lumbar region: Secondary | ICD-10-CM | POA: Diagnosis not present

## 2015-04-21 DIAGNOSIS — M791 Myalgia: Secondary | ICD-10-CM | POA: Diagnosis not present

## 2015-04-21 DIAGNOSIS — M4806 Spinal stenosis, lumbar region: Secondary | ICD-10-CM | POA: Diagnosis not present

## 2015-04-22 DIAGNOSIS — L570 Actinic keratosis: Secondary | ICD-10-CM | POA: Diagnosis not present

## 2015-04-22 DIAGNOSIS — Z85828 Personal history of other malignant neoplasm of skin: Secondary | ICD-10-CM | POA: Diagnosis not present

## 2015-05-05 DIAGNOSIS — C61 Malignant neoplasm of prostate: Secondary | ICD-10-CM | POA: Diagnosis not present

## 2015-05-28 DIAGNOSIS — Z961 Presence of intraocular lens: Secondary | ICD-10-CM | POA: Diagnosis not present

## 2015-06-10 DIAGNOSIS — Z981 Arthrodesis status: Secondary | ICD-10-CM | POA: Diagnosis not present

## 2015-06-10 DIAGNOSIS — M1612 Unilateral primary osteoarthritis, left hip: Secondary | ICD-10-CM | POA: Diagnosis not present

## 2015-06-15 DIAGNOSIS — M1612 Unilateral primary osteoarthritis, left hip: Secondary | ICD-10-CM | POA: Diagnosis not present

## 2015-06-15 DIAGNOSIS — M25552 Pain in left hip: Secondary | ICD-10-CM | POA: Diagnosis not present

## 2015-06-29 DIAGNOSIS — M4806 Spinal stenosis, lumbar region: Secondary | ICD-10-CM | POA: Diagnosis not present

## 2015-06-29 DIAGNOSIS — M47816 Spondylosis without myelopathy or radiculopathy, lumbar region: Secondary | ICD-10-CM | POA: Diagnosis not present

## 2015-07-06 DIAGNOSIS — K449 Diaphragmatic hernia without obstruction or gangrene: Secondary | ICD-10-CM | POA: Diagnosis not present

## 2015-07-06 DIAGNOSIS — Z981 Arthrodesis status: Secondary | ICD-10-CM | POA: Diagnosis not present

## 2015-07-06 DIAGNOSIS — M5136 Other intervertebral disc degeneration, lumbar region: Secondary | ICD-10-CM | POA: Diagnosis not present

## 2015-07-06 DIAGNOSIS — M1612 Unilateral primary osteoarthritis, left hip: Secondary | ICD-10-CM | POA: Diagnosis not present

## 2015-07-06 DIAGNOSIS — K5730 Diverticulosis of large intestine without perforation or abscess without bleeding: Secondary | ICD-10-CM | POA: Diagnosis not present

## 2015-07-06 DIAGNOSIS — M5137 Other intervertebral disc degeneration, lumbosacral region: Secondary | ICD-10-CM | POA: Diagnosis not present

## 2015-07-06 DIAGNOSIS — M47816 Spondylosis without myelopathy or radiculopathy, lumbar region: Secondary | ICD-10-CM | POA: Diagnosis not present

## 2015-07-07 DIAGNOSIS — Z23 Encounter for immunization: Secondary | ICD-10-CM | POA: Diagnosis not present

## 2015-07-22 DIAGNOSIS — M47816 Spondylosis without myelopathy or radiculopathy, lumbar region: Secondary | ICD-10-CM | POA: Diagnosis not present

## 2015-07-22 DIAGNOSIS — M4806 Spinal stenosis, lumbar region: Secondary | ICD-10-CM | POA: Diagnosis not present

## 2015-07-22 DIAGNOSIS — M5136 Other intervertebral disc degeneration, lumbar region: Secondary | ICD-10-CM | POA: Diagnosis not present

## 2015-07-27 DIAGNOSIS — E559 Vitamin D deficiency, unspecified: Secondary | ICD-10-CM | POA: Diagnosis not present

## 2015-07-27 DIAGNOSIS — I10 Essential (primary) hypertension: Secondary | ICD-10-CM | POA: Diagnosis not present

## 2015-07-27 DIAGNOSIS — E039 Hypothyroidism, unspecified: Secondary | ICD-10-CM | POA: Diagnosis not present

## 2015-07-27 DIAGNOSIS — N189 Chronic kidney disease, unspecified: Secondary | ICD-10-CM | POA: Diagnosis not present

## 2015-07-27 DIAGNOSIS — N411 Chronic prostatitis: Secondary | ICD-10-CM | POA: Diagnosis not present

## 2015-07-30 DIAGNOSIS — M545 Low back pain: Secondary | ICD-10-CM | POA: Diagnosis not present

## 2015-07-30 DIAGNOSIS — I10 Essential (primary) hypertension: Secondary | ICD-10-CM | POA: Diagnosis not present

## 2015-07-30 DIAGNOSIS — E039 Hypothyroidism, unspecified: Secondary | ICD-10-CM | POA: Diagnosis not present

## 2015-07-30 DIAGNOSIS — Z96652 Presence of left artificial knee joint: Secondary | ICD-10-CM | POA: Diagnosis not present

## 2015-07-30 DIAGNOSIS — M1712 Unilateral primary osteoarthritis, left knee: Secondary | ICD-10-CM | POA: Diagnosis not present

## 2015-07-30 DIAGNOSIS — Z96651 Presence of right artificial knee joint: Secondary | ICD-10-CM | POA: Diagnosis not present

## 2015-07-30 DIAGNOSIS — N182 Chronic kidney disease, stage 2 (mild): Secondary | ICD-10-CM | POA: Diagnosis not present

## 2015-07-30 DIAGNOSIS — M1612 Unilateral primary osteoarthritis, left hip: Secondary | ICD-10-CM | POA: Diagnosis not present

## 2015-07-31 ENCOUNTER — Encounter: Payer: Self-pay | Admitting: Cardiovascular Disease

## 2015-07-31 ENCOUNTER — Ambulatory Visit (INDEPENDENT_AMBULATORY_CARE_PROVIDER_SITE_OTHER): Payer: Medicare Other | Admitting: Cardiovascular Disease

## 2015-07-31 ENCOUNTER — Encounter: Payer: Self-pay | Admitting: *Deleted

## 2015-07-31 VITALS — BP 100/68 | HR 70 | Ht 66.0 in | Wt 179.0 lb

## 2015-07-31 DIAGNOSIS — I10 Essential (primary) hypertension: Secondary | ICD-10-CM | POA: Diagnosis not present

## 2015-07-31 DIAGNOSIS — I493 Ventricular premature depolarization: Secondary | ICD-10-CM

## 2015-07-31 DIAGNOSIS — Z01810 Encounter for preprocedural cardiovascular examination: Secondary | ICD-10-CM | POA: Diagnosis not present

## 2015-07-31 NOTE — Progress Notes (Signed)
Patient ID: Scott [name], male   DOB: 27-Feb-1928, 81 y.o.   MRN: [id number]      SUBJECTIVE: The patient is here to followup for premature atrial and ventricular contractions. Echocardiography in November 2014 demonstrated normal left ventricular systolic function, EF 85-02%, with grade 1 diastolic dysfunction and PA pressures of 42 mm mercury. He remains asymptomatic, specifically denying chest pain and shortness of breath.  He very seldom has palpitations. He is soon to undergo hip replacement surgery.  ECG performed in the office today demonstrates normal sinus rhythm with no ischemic ST segment or T-wave abnormalities, nor any arrhythmias.    Review of Systems: As per "subjective", otherwise negative.  Allergies  Allergen Reactions  . Tramadol     Pt noticed elevated blood pressure when taking.    Current Outpatient Prescriptions  Medication Sig Dispense Refill  . amLODipine (NORVASC) 2.5 MG tablet Take 2.5 mg by mouth daily.    Marland Kitchen doxazosin (CARDURA) 8 MG tablet Take 8 mg by mouth at bedtime.    Marland Kitchen labetalol (NORMODYNE) 200 MG tablet Take 200 mg by mouth 2 (two) times daily.    Marland Kitchen levothyroxine (SYNTHROID, LEVOTHROID) 137 MCG tablet Take 137 mcg by mouth daily before breakfast.    . lisinopril (PRINIVIL,ZESTRIL) 40 MG tablet Take 40 mg by mouth daily.    . naproxen sodium (ANAPROX) 275 MG tablet Take 275 mg by mouth 4 (four) times daily. Take 2 (500mg  total) twice a day    . omeprazole (PRILOSEC) 20 MG capsule Take 20 mg by mouth daily.    Marland Kitchen oxyCODONE-acetaminophen (PERCOCET/ROXICET) 5-325 MG tablet Take 1 tablet by mouth every 4 (four) hours as needed for severe pain.    Marland Kitchen solifenacin (VESICARE) 10 MG tablet Take 10 mg by mouth daily.    . tamsulosin (FLOMAX) 0.4 MG CAPS capsule Take 0.4 mg by mouth daily.     No current facility-administered medications for this visit.    Past Medical History  Diagnosis Date  . Hypertension   . Hyperlipidemia   . Hypothyroidism   .  Arrhythmia   . Chronic kidney disease     Past Surgical History  Procedure Laterality Date  . Colonoscopy    . Knee surgery    . Cataract extraction      Social History   Social History  . Marital Status: Married    Spouse Name: N/A  . Number of Children: N/A  . Years of Education: N/A   Occupational History  . Not on file.   Social History Main Topics  . Smoking status: Former Smoker    Start date: 10/17/1965    Quit date: 10/17/1978  . Smokeless tobacco: Never Used  . Alcohol Use: No  . Drug Use: No  . Sexual Activity: Not on file   Other Topics Concern  . Not on file   Social History Narrative     Filed Vitals:   07/31/15 1324  BP: 100/68  Pulse: 70  Height: 5\' 6"  (1.676 m)  Weight: 179 lb (81.194 kg)  SpO2: 98%    PHYSICAL EXAM General: NAD HEENT: Normal. Neck: No JVD, no thyromegaly. Lungs: Clear to auscultation bilaterally with normal respiratory effort. CV: Nondisplaced PMI. Regular rate and rhythm with premature contractions, normal S1/S2, no S3/S4, no murmur. No pretibial or periankle edema. No carotid bruit. Normal pedal pulses.  Abdomen: Soft, nontender, no hepatosplenomegaly, no distention.  Neurologic: Alert and oriented x 3.  Psych: Normal affect. Skin: Normal. Musculoskeletal: Normal range of  motion, no gross deformities. Extremities: No clubbing or cyanosis.   ECG: Most recent ECG reviewed.    ASSESSMENT AND PLAN: 1. PAC's/PVC's: Symptomatically stable on labetalol 200 mg bid. Normal LV systolic function in 2641. Given upcoming surgery, will obtain a Lexiscan Cardiolite stress test for more accurate preoperative risk stratification.  2. Essential HTN: Controlled. No changes.  Dispo: f/u to be determined after stress test.   Kate Sable, M.D., F.A.C.C.

## 2015-07-31 NOTE — Patient Instructions (Signed)
Your physician recommends that you continue on your current medications as directed. Please refer to the Current Medication list given to you today. Your physician has requested that you have a lexiscan myoview. For further information please visit [email or web address]. Please follow instruction sheet, as given. We will call you with your results. 

## 2015-08-05 ENCOUNTER — Encounter (HOSPITAL_COMMUNITY)
Admission: RE | Admit: 2015-08-05 | Discharge: 2015-08-05 | Disposition: A | Discharge status: Home / Self Care | Payer: Medicare Other | Source: Ambulatory Visit | Attending: Cardiovascular Disease | Admitting: Cardiovascular Disease

## 2015-08-05 ENCOUNTER — Inpatient Hospital Stay (HOSPITAL_COMMUNITY): Admission: RE | Admit: 2015-08-05 | Payer: Medicare Other | Source: Ambulatory Visit

## 2015-08-05 ENCOUNTER — Encounter (HOSPITAL_COMMUNITY): Payer: Self-pay

## 2015-08-05 DIAGNOSIS — I493 Ventricular premature depolarization: Secondary | ICD-10-CM | POA: Diagnosis not present

## 2015-08-05 DIAGNOSIS — Z01810 Encounter for preprocedural cardiovascular examination: Secondary | ICD-10-CM | POA: Diagnosis not present

## 2015-08-05 DIAGNOSIS — I10 Essential (primary) hypertension: Secondary | ICD-10-CM | POA: Diagnosis not present

## 2015-08-05 LAB — NM MYOCAR MULTI W/SPECT W/WALL MOTION / EF
CHL CUP RESTING HR STRESS: 61 {beats}/min
LHR: 0.08
LV dias vol: 78 mL
LVSYSVOL: 28 mL
Peak HR: 86 {beats}/min
SDS: 3
SRS: 2
SSS: 4
TID: 1.17

## 2015-08-05 IMAGING — NM NM MYOCAR MULTI W/SPECT W/WALL MOTION & EF
2 series · 12 of 12 positions shown · non-contrast
Comparison: none

[Series 1: rest · 8.28mm/px · 6 of 64 frames shown]
[frame 6/64]
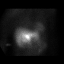
[frame 16/64]
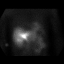
[frame 27/64]
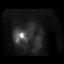
[frame 38/64]
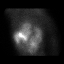
[frame 48/64]
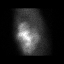
[frame 59/64]
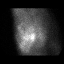

[Series 3: stress gated - perfusion · 8.28mm/px · 6 of 64 frames shown]
[frame 6/64]
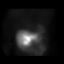
[frame 16/64]
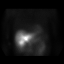
[frame 27/64]
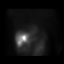
[frame 38/64]
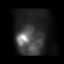
[frame 48/64]
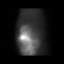
[frame 59/64]
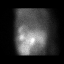

[12 of 12 positions shown; findings below may reference images not displayed]

Canned report from images found in remote index.

Refer to host system for actual result text.

## 2015-08-05 MED ORDER — TECHNETIUM TC 99M SESTAMIBI GENERIC - CARDIOLITE
10.0000 | Freq: Once | INTRAVENOUS | Status: AC | PRN
  Administered 2015-08-05: 10 via INTRAVENOUS

## 2015-08-05 MED ORDER — SODIUM CHLORIDE 0.9 % IJ SOLN
INTRAMUSCULAR | Status: AC
  Administered 2015-08-05: 10 mL via INTRAVENOUS
  Filled 2015-08-05: qty 3

## 2015-08-05 MED ORDER — REGADENOSON 0.4 MG/5ML IV SOLN
INTRAVENOUS | Status: AC
  Administered 2015-08-05: 0.4 mg via INTRAVENOUS
  Filled 2015-08-05: qty 5

## 2015-08-05 MED ORDER — TECHNETIUM TC 99M SESTAMIBI - CARDIOLITE
30.0000 | Freq: Once | INTRAVENOUS | Status: AC | PRN
  Administered 2015-08-05: 11:00:00 30 via INTRAVENOUS

## 2015-08-12 DIAGNOSIS — Z981 Arthrodesis status: Secondary | ICD-10-CM | POA: Diagnosis not present

## 2015-08-12 DIAGNOSIS — M1612 Unilateral primary osteoarthritis, left hip: Secondary | ICD-10-CM | POA: Diagnosis not present

## 2015-09-28 DIAGNOSIS — Z01812 Encounter for preprocedural laboratory examination: Secondary | ICD-10-CM | POA: Diagnosis not present

## 2015-09-28 DIAGNOSIS — R350 Frequency of micturition: Secondary | ICD-10-CM | POA: Diagnosis not present

## 2015-10-07 DIAGNOSIS — M169 Osteoarthritis of hip, unspecified: Secondary | ICD-10-CM | POA: Diagnosis not present

## 2015-10-07 DIAGNOSIS — M25552 Pain in left hip: Secondary | ICD-10-CM | POA: Diagnosis not present

## 2015-10-18 HISTORY — PX: JOINT REPLACEMENT: SHX530

## 2015-10-20 DIAGNOSIS — M5136 Other intervertebral disc degeneration, lumbar region: Secondary | ICD-10-CM | POA: Diagnosis not present

## 2015-10-20 DIAGNOSIS — E039 Hypothyroidism, unspecified: Secondary | ICD-10-CM | POA: Diagnosis not present

## 2015-10-20 DIAGNOSIS — Z79899 Other long term (current) drug therapy: Secondary | ICD-10-CM | POA: Diagnosis not present

## 2015-10-20 DIAGNOSIS — K219 Gastro-esophageal reflux disease without esophagitis: Secondary | ICD-10-CM | POA: Diagnosis not present

## 2015-10-20 DIAGNOSIS — Z01818 Encounter for other preprocedural examination: Secondary | ICD-10-CM | POA: Diagnosis not present

## 2015-10-20 DIAGNOSIS — M1611 Unilateral primary osteoarthritis, right hip: Secondary | ICD-10-CM | POA: Diagnosis not present

## 2015-10-20 DIAGNOSIS — I10 Essential (primary) hypertension: Secondary | ICD-10-CM | POA: Diagnosis not present

## 2015-10-20 DIAGNOSIS — Z96653 Presence of artificial knee joint, bilateral: Secondary | ICD-10-CM | POA: Diagnosis not present

## 2015-10-20 DIAGNOSIS — I499 Cardiac arrhythmia, unspecified: Secondary | ICD-10-CM | POA: Diagnosis not present

## 2015-10-22 DIAGNOSIS — M1612 Unilateral primary osteoarthritis, left hip: Secondary | ICD-10-CM | POA: Diagnosis not present

## 2015-10-27 DIAGNOSIS — M1612 Unilateral primary osteoarthritis, left hip: Secondary | ICD-10-CM | POA: Diagnosis not present

## 2015-10-27 DIAGNOSIS — R262 Difficulty in walking, not elsewhere classified: Secondary | ICD-10-CM | POA: Diagnosis not present

## 2015-10-27 DIAGNOSIS — M6281 Muscle weakness (generalized): Secondary | ICD-10-CM | POA: Diagnosis not present

## 2015-10-27 DIAGNOSIS — Z833 Family history of diabetes mellitus: Secondary | ICD-10-CM | POA: Diagnosis not present

## 2015-10-27 DIAGNOSIS — Z96642 Presence of left artificial hip joint: Secondary | ICD-10-CM | POA: Diagnosis not present

## 2015-10-27 DIAGNOSIS — Z471 Aftercare following joint replacement surgery: Secondary | ICD-10-CM | POA: Diagnosis not present

## 2015-10-27 DIAGNOSIS — M47896 Other spondylosis, lumbar region: Secondary | ICD-10-CM | POA: Diagnosis not present

## 2015-10-27 DIAGNOSIS — Z8249 Family history of ischemic heart disease and other diseases of the circulatory system: Secondary | ICD-10-CM | POA: Diagnosis not present

## 2015-10-27 DIAGNOSIS — Z79891 Long term (current) use of opiate analgesic: Secondary | ICD-10-CM | POA: Diagnosis not present

## 2015-10-27 DIAGNOSIS — E039 Hypothyroidism, unspecified: Secondary | ICD-10-CM | POA: Diagnosis not present

## 2015-10-27 DIAGNOSIS — Z981 Arthrodesis status: Secondary | ICD-10-CM | POA: Diagnosis not present

## 2015-10-27 DIAGNOSIS — Z96653 Presence of artificial knee joint, bilateral: Secondary | ICD-10-CM | POA: Diagnosis not present

## 2015-10-27 DIAGNOSIS — M169 Osteoarthritis of hip, unspecified: Secondary | ICD-10-CM | POA: Diagnosis not present

## 2015-10-27 DIAGNOSIS — I10 Essential (primary) hypertension: Secondary | ICD-10-CM | POA: Diagnosis not present

## 2015-10-27 DIAGNOSIS — K219 Gastro-esophageal reflux disease without esophagitis: Secondary | ICD-10-CM | POA: Diagnosis not present

## 2015-10-27 DIAGNOSIS — Z79899 Other long term (current) drug therapy: Secondary | ICD-10-CM | POA: Diagnosis not present

## 2015-10-27 DIAGNOSIS — N400 Enlarged prostate without lower urinary tract symptoms: Secondary | ICD-10-CM | POA: Diagnosis not present

## 2015-10-31 DIAGNOSIS — Z471 Aftercare following joint replacement surgery: Secondary | ICD-10-CM | POA: Diagnosis not present

## 2015-10-31 DIAGNOSIS — K219 Gastro-esophageal reflux disease without esophagitis: Secondary | ICD-10-CM | POA: Diagnosis not present

## 2015-10-31 DIAGNOSIS — M169 Osteoarthritis of hip, unspecified: Secondary | ICD-10-CM | POA: Diagnosis not present

## 2015-10-31 DIAGNOSIS — N400 Enlarged prostate without lower urinary tract symptoms: Secondary | ICD-10-CM | POA: Diagnosis not present

## 2015-10-31 DIAGNOSIS — M6281 Muscle weakness (generalized): Secondary | ICD-10-CM | POA: Diagnosis not present

## 2015-10-31 DIAGNOSIS — Z96642 Presence of left artificial hip joint: Secondary | ICD-10-CM | POA: Diagnosis not present

## 2015-10-31 DIAGNOSIS — M47896 Other spondylosis, lumbar region: Secondary | ICD-10-CM | POA: Diagnosis not present

## 2015-10-31 DIAGNOSIS — R262 Difficulty in walking, not elsewhere classified: Secondary | ICD-10-CM | POA: Diagnosis not present

## 2015-10-31 DIAGNOSIS — E039 Hypothyroidism, unspecified: Secondary | ICD-10-CM | POA: Diagnosis not present

## 2015-10-31 DIAGNOSIS — I10 Essential (primary) hypertension: Secondary | ICD-10-CM | POA: Diagnosis not present

## 2015-10-31 DIAGNOSIS — M1612 Unilateral primary osteoarthritis, left hip: Secondary | ICD-10-CM | POA: Diagnosis not present

## 2015-11-01 DIAGNOSIS — I10 Essential (primary) hypertension: Secondary | ICD-10-CM | POA: Diagnosis not present

## 2015-11-01 DIAGNOSIS — M1612 Unilateral primary osteoarthritis, left hip: Secondary | ICD-10-CM | POA: Diagnosis not present

## 2015-11-07 DIAGNOSIS — I10 Essential (primary) hypertension: Secondary | ICD-10-CM | POA: Diagnosis not present

## 2015-11-07 DIAGNOSIS — M1612 Unilateral primary osteoarthritis, left hip: Secondary | ICD-10-CM | POA: Diagnosis not present

## 2015-11-10 DIAGNOSIS — Z96642 Presence of left artificial hip joint: Secondary | ICD-10-CM | POA: Diagnosis not present

## 2015-11-10 DIAGNOSIS — M1612 Unilateral primary osteoarthritis, left hip: Secondary | ICD-10-CM | POA: Diagnosis not present

## 2015-11-18 DIAGNOSIS — M1612 Unilateral primary osteoarthritis, left hip: Secondary | ICD-10-CM | POA: Diagnosis not present

## 2015-11-18 DIAGNOSIS — Z96642 Presence of left artificial hip joint: Secondary | ICD-10-CM | POA: Diagnosis not present

## 2015-11-18 DIAGNOSIS — Z96652 Presence of left artificial knee joint: Secondary | ICD-10-CM | POA: Diagnosis not present

## 2015-11-18 DIAGNOSIS — Z1389 Encounter for screening for other disorder: Secondary | ICD-10-CM | POA: Diagnosis not present

## 2015-11-18 DIAGNOSIS — Z96651 Presence of right artificial knee joint: Secondary | ICD-10-CM | POA: Diagnosis not present

## 2015-11-20 DIAGNOSIS — Z96642 Presence of left artificial hip joint: Secondary | ICD-10-CM | POA: Diagnosis not present

## 2015-11-20 DIAGNOSIS — R262 Difficulty in walking, not elsewhere classified: Secondary | ICD-10-CM | POA: Diagnosis not present

## 2015-11-20 DIAGNOSIS — M25552 Pain in left hip: Secondary | ICD-10-CM | POA: Diagnosis not present

## 2015-11-20 DIAGNOSIS — Z471 Aftercare following joint replacement surgery: Secondary | ICD-10-CM | POA: Diagnosis not present

## 2015-11-23 DIAGNOSIS — Z96642 Presence of left artificial hip joint: Secondary | ICD-10-CM | POA: Diagnosis not present

## 2015-11-23 DIAGNOSIS — R262 Difficulty in walking, not elsewhere classified: Secondary | ICD-10-CM | POA: Diagnosis not present

## 2015-11-23 DIAGNOSIS — M25552 Pain in left hip: Secondary | ICD-10-CM | POA: Diagnosis not present

## 2015-11-23 DIAGNOSIS — Z471 Aftercare following joint replacement surgery: Secondary | ICD-10-CM | POA: Diagnosis not present

## 2015-11-25 DIAGNOSIS — M25552 Pain in left hip: Secondary | ICD-10-CM | POA: Diagnosis not present

## 2015-11-25 DIAGNOSIS — Z471 Aftercare following joint replacement surgery: Secondary | ICD-10-CM | POA: Diagnosis not present

## 2015-11-25 DIAGNOSIS — R262 Difficulty in walking, not elsewhere classified: Secondary | ICD-10-CM | POA: Diagnosis not present

## 2015-11-25 DIAGNOSIS — Z96642 Presence of left artificial hip joint: Secondary | ICD-10-CM | POA: Diagnosis not present

## 2015-11-27 DIAGNOSIS — Z471 Aftercare following joint replacement surgery: Secondary | ICD-10-CM | POA: Diagnosis not present

## 2015-11-27 DIAGNOSIS — R262 Difficulty in walking, not elsewhere classified: Secondary | ICD-10-CM | POA: Diagnosis not present

## 2015-11-27 DIAGNOSIS — Z96642 Presence of left artificial hip joint: Secondary | ICD-10-CM | POA: Diagnosis not present

## 2015-11-27 DIAGNOSIS — M25552 Pain in left hip: Secondary | ICD-10-CM | POA: Diagnosis not present

## 2015-11-30 DIAGNOSIS — M25552 Pain in left hip: Secondary | ICD-10-CM | POA: Diagnosis not present

## 2015-11-30 DIAGNOSIS — Z96642 Presence of left artificial hip joint: Secondary | ICD-10-CM | POA: Diagnosis not present

## 2015-11-30 DIAGNOSIS — R262 Difficulty in walking, not elsewhere classified: Secondary | ICD-10-CM | POA: Diagnosis not present

## 2015-11-30 DIAGNOSIS — Z471 Aftercare following joint replacement surgery: Secondary | ICD-10-CM | POA: Diagnosis not present

## 2015-12-02 DIAGNOSIS — R262 Difficulty in walking, not elsewhere classified: Secondary | ICD-10-CM | POA: Diagnosis not present

## 2015-12-02 DIAGNOSIS — Z471 Aftercare following joint replacement surgery: Secondary | ICD-10-CM | POA: Diagnosis not present

## 2015-12-02 DIAGNOSIS — M25552 Pain in left hip: Secondary | ICD-10-CM | POA: Diagnosis not present

## 2015-12-02 DIAGNOSIS — Z96642 Presence of left artificial hip joint: Secondary | ICD-10-CM | POA: Diagnosis not present

## 2015-12-04 DIAGNOSIS — M25552 Pain in left hip: Secondary | ICD-10-CM | POA: Diagnosis not present

## 2015-12-04 DIAGNOSIS — Z471 Aftercare following joint replacement surgery: Secondary | ICD-10-CM | POA: Diagnosis not present

## 2015-12-04 DIAGNOSIS — Z96642 Presence of left artificial hip joint: Secondary | ICD-10-CM | POA: Diagnosis not present

## 2015-12-04 DIAGNOSIS — R262 Difficulty in walking, not elsewhere classified: Secondary | ICD-10-CM | POA: Diagnosis not present

## 2015-12-07 DIAGNOSIS — M25552 Pain in left hip: Secondary | ICD-10-CM | POA: Diagnosis not present

## 2015-12-07 DIAGNOSIS — R262 Difficulty in walking, not elsewhere classified: Secondary | ICD-10-CM | POA: Diagnosis not present

## 2015-12-07 DIAGNOSIS — Z96642 Presence of left artificial hip joint: Secondary | ICD-10-CM | POA: Diagnosis not present

## 2015-12-07 DIAGNOSIS — Z471 Aftercare following joint replacement surgery: Secondary | ICD-10-CM | POA: Diagnosis not present

## 2015-12-09 DIAGNOSIS — Z96642 Presence of left artificial hip joint: Secondary | ICD-10-CM | POA: Diagnosis not present

## 2015-12-09 DIAGNOSIS — M1612 Unilateral primary osteoarthritis, left hip: Secondary | ICD-10-CM | POA: Diagnosis not present

## 2015-12-09 DIAGNOSIS — C61 Malignant neoplasm of prostate: Secondary | ICD-10-CM | POA: Diagnosis not present

## 2015-12-10 DIAGNOSIS — Z96642 Presence of left artificial hip joint: Secondary | ICD-10-CM | POA: Diagnosis not present

## 2015-12-10 DIAGNOSIS — R262 Difficulty in walking, not elsewhere classified: Secondary | ICD-10-CM | POA: Diagnosis not present

## 2015-12-10 DIAGNOSIS — M25552 Pain in left hip: Secondary | ICD-10-CM | POA: Diagnosis not present

## 2015-12-10 DIAGNOSIS — Z471 Aftercare following joint replacement surgery: Secondary | ICD-10-CM | POA: Diagnosis not present

## 2015-12-14 DIAGNOSIS — C61 Malignant neoplasm of prostate: Secondary | ICD-10-CM | POA: Diagnosis not present

## 2015-12-14 DIAGNOSIS — R3915 Urgency of urination: Secondary | ICD-10-CM | POA: Diagnosis not present

## 2015-12-14 DIAGNOSIS — N3281 Overactive bladder: Secondary | ICD-10-CM | POA: Diagnosis not present

## 2016-02-16 DIAGNOSIS — R42 Dizziness and giddiness: Secondary | ICD-10-CM | POA: Diagnosis not present

## 2016-02-16 DIAGNOSIS — H6122 Impacted cerumen, left ear: Secondary | ICD-10-CM | POA: Diagnosis not present

## 2016-02-25 DIAGNOSIS — M1612 Unilateral primary osteoarthritis, left hip: Secondary | ICD-10-CM | POA: Diagnosis not present

## 2016-02-25 DIAGNOSIS — Z96642 Presence of left artificial hip joint: Secondary | ICD-10-CM | POA: Diagnosis not present

## 2016-02-25 DIAGNOSIS — M545 Low back pain: Secondary | ICD-10-CM | POA: Diagnosis not present

## 2016-02-25 DIAGNOSIS — Z981 Arthrodesis status: Secondary | ICD-10-CM | POA: Diagnosis not present

## 2016-04-13 DIAGNOSIS — N182 Chronic kidney disease, stage 2 (mild): Secondary | ICD-10-CM | POA: Diagnosis not present

## 2016-04-13 DIAGNOSIS — M1712 Unilateral primary osteoarthritis, left knee: Secondary | ICD-10-CM | POA: Diagnosis not present

## 2016-04-13 DIAGNOSIS — M545 Low back pain: Secondary | ICD-10-CM | POA: Diagnosis not present

## 2016-04-13 DIAGNOSIS — I10 Essential (primary) hypertension: Secondary | ICD-10-CM | POA: Diagnosis not present

## 2016-04-13 DIAGNOSIS — M1612 Unilateral primary osteoarthritis, left hip: Secondary | ICD-10-CM | POA: Diagnosis not present

## 2016-04-14 DIAGNOSIS — M4806 Spinal stenosis, lumbar region: Secondary | ICD-10-CM | POA: Diagnosis not present

## 2016-04-14 DIAGNOSIS — M47816 Spondylosis without myelopathy or radiculopathy, lumbar region: Secondary | ICD-10-CM | POA: Diagnosis not present

## 2016-04-21 DIAGNOSIS — M47816 Spondylosis without myelopathy or radiculopathy, lumbar region: Secondary | ICD-10-CM | POA: Diagnosis not present

## 2016-04-21 DIAGNOSIS — M5126 Other intervertebral disc displacement, lumbar region: Secondary | ICD-10-CM | POA: Diagnosis not present

## 2016-04-21 DIAGNOSIS — M4806 Spinal stenosis, lumbar region: Secondary | ICD-10-CM | POA: Diagnosis not present

## 2016-04-21 DIAGNOSIS — Z981 Arthrodesis status: Secondary | ICD-10-CM | POA: Diagnosis not present

## 2016-05-06 DIAGNOSIS — I10 Essential (primary) hypertension: Secondary | ICD-10-CM | POA: Diagnosis not present

## 2016-05-06 DIAGNOSIS — M65322 Trigger finger, left index finger: Secondary | ICD-10-CM | POA: Diagnosis not present

## 2016-05-06 DIAGNOSIS — M65332 Trigger finger, left middle finger: Secondary | ICD-10-CM | POA: Diagnosis not present

## 2016-05-06 DIAGNOSIS — N182 Chronic kidney disease, stage 2 (mild): Secondary | ICD-10-CM | POA: Diagnosis not present

## 2016-05-10 DIAGNOSIS — M4806 Spinal stenosis, lumbar region: Secondary | ICD-10-CM | POA: Diagnosis not present

## 2016-05-10 DIAGNOSIS — M5136 Other intervertebral disc degeneration, lumbar region: Secondary | ICD-10-CM | POA: Diagnosis not present

## 2016-05-10 DIAGNOSIS — M47816 Spondylosis without myelopathy or radiculopathy, lumbar region: Secondary | ICD-10-CM | POA: Diagnosis not present

## 2016-06-07 DIAGNOSIS — C61 Malignant neoplasm of prostate: Secondary | ICD-10-CM | POA: Diagnosis not present

## 2016-06-08 DIAGNOSIS — H1013 Acute atopic conjunctivitis, bilateral: Secondary | ICD-10-CM | POA: Diagnosis not present

## 2016-06-13 DIAGNOSIS — N3281 Overactive bladder: Secondary | ICD-10-CM | POA: Diagnosis not present

## 2016-06-13 DIAGNOSIS — Z23 Encounter for immunization: Secondary | ICD-10-CM | POA: Diagnosis not present

## 2016-06-13 DIAGNOSIS — C61 Malignant neoplasm of prostate: Secondary | ICD-10-CM | POA: Diagnosis not present

## 2016-10-28 DIAGNOSIS — K148 Other diseases of tongue: Secondary | ICD-10-CM | POA: Diagnosis not present

## 2016-11-08 DIAGNOSIS — M47816 Spondylosis without myelopathy or radiculopathy, lumbar region: Secondary | ICD-10-CM | POA: Diagnosis not present

## 2016-11-14 DIAGNOSIS — I10 Essential (primary) hypertension: Secondary | ICD-10-CM | POA: Diagnosis not present

## 2016-11-14 DIAGNOSIS — Z96652 Presence of left artificial knee joint: Secondary | ICD-10-CM | POA: Diagnosis not present

## 2016-11-14 DIAGNOSIS — L821 Other seborrheic keratosis: Secondary | ICD-10-CM | POA: Diagnosis not present

## 2016-11-14 DIAGNOSIS — M19042 Primary osteoarthritis, left hand: Secondary | ICD-10-CM | POA: Diagnosis not present

## 2016-11-14 DIAGNOSIS — M19041 Primary osteoarthritis, right hand: Secondary | ICD-10-CM | POA: Diagnosis not present

## 2016-11-14 DIAGNOSIS — E039 Hypothyroidism, unspecified: Secondary | ICD-10-CM | POA: Diagnosis not present

## 2016-11-14 DIAGNOSIS — Z96642 Presence of left artificial hip joint: Secondary | ICD-10-CM | POA: Diagnosis not present

## 2016-11-14 DIAGNOSIS — Z96651 Presence of right artificial knee joint: Secondary | ICD-10-CM | POA: Diagnosis not present

## 2017-01-16 DIAGNOSIS — L570 Actinic keratosis: Secondary | ICD-10-CM | POA: Diagnosis not present

## 2017-01-25 ENCOUNTER — Ambulatory Visit (INDEPENDENT_AMBULATORY_CARE_PROVIDER_SITE_OTHER): Payer: Medicare Other | Admitting: Urology

## 2017-01-25 DIAGNOSIS — N401 Enlarged prostate with lower urinary tract symptoms: Secondary | ICD-10-CM | POA: Diagnosis not present

## 2017-01-25 DIAGNOSIS — C61 Malignant neoplasm of prostate: Secondary | ICD-10-CM

## 2017-01-25 DIAGNOSIS — R351 Nocturia: Secondary | ICD-10-CM | POA: Diagnosis not present

## 2017-03-07 ENCOUNTER — Ambulatory Visit (INDEPENDENT_AMBULATORY_CARE_PROVIDER_SITE_OTHER): Payer: Medicare Other | Admitting: Urology

## 2017-03-07 DIAGNOSIS — M1712 Unilateral primary osteoarthritis, left knee: Secondary | ICD-10-CM | POA: Diagnosis not present

## 2017-03-07 DIAGNOSIS — R3915 Urgency of urination: Secondary | ICD-10-CM | POA: Diagnosis not present

## 2017-03-07 DIAGNOSIS — C61 Malignant neoplasm of prostate: Secondary | ICD-10-CM

## 2017-03-07 DIAGNOSIS — I10 Essential (primary) hypertension: Secondary | ICD-10-CM | POA: Diagnosis not present

## 2017-03-07 DIAGNOSIS — N400 Enlarged prostate without lower urinary tract symptoms: Secondary | ICD-10-CM | POA: Diagnosis not present

## 2017-03-07 DIAGNOSIS — E039 Hypothyroidism, unspecified: Secondary | ICD-10-CM | POA: Diagnosis not present

## 2017-03-07 DIAGNOSIS — N401 Enlarged prostate with lower urinary tract symptoms: Secondary | ICD-10-CM | POA: Diagnosis not present

## 2017-03-07 DIAGNOSIS — N182 Chronic kidney disease, stage 2 (mild): Secondary | ICD-10-CM | POA: Diagnosis not present

## 2017-03-07 DIAGNOSIS — K148 Other diseases of tongue: Secondary | ICD-10-CM | POA: Diagnosis not present

## 2017-03-07 DIAGNOSIS — R351 Nocturia: Secondary | ICD-10-CM

## 2017-03-10 DIAGNOSIS — Z961 Presence of intraocular lens: Secondary | ICD-10-CM | POA: Diagnosis not present

## 2017-03-10 DIAGNOSIS — H35363 Drusen (degenerative) of macula, bilateral: Secondary | ICD-10-CM | POA: Diagnosis not present

## 2017-03-17 DIAGNOSIS — Z6829 Body mass index (BMI) 29.0-29.9, adult: Secondary | ICD-10-CM | POA: Diagnosis not present

## 2017-03-17 DIAGNOSIS — M1612 Unilateral primary osteoarthritis, left hip: Secondary | ICD-10-CM | POA: Diagnosis not present

## 2017-03-17 DIAGNOSIS — M1712 Unilateral primary osteoarthritis, left knee: Secondary | ICD-10-CM | POA: Diagnosis not present

## 2017-03-17 DIAGNOSIS — I10 Essential (primary) hypertension: Secondary | ICD-10-CM | POA: Diagnosis not present

## 2017-03-17 DIAGNOSIS — M545 Low back pain: Secondary | ICD-10-CM | POA: Diagnosis not present

## 2017-03-17 DIAGNOSIS — Z96642 Presence of left artificial hip joint: Secondary | ICD-10-CM | POA: Diagnosis not present

## 2017-03-17 DIAGNOSIS — N182 Chronic kidney disease, stage 2 (mild): Secondary | ICD-10-CM | POA: Diagnosis not present

## 2017-03-17 DIAGNOSIS — E039 Hypothyroidism, unspecified: Secondary | ICD-10-CM | POA: Diagnosis not present

## 2017-03-27 DIAGNOSIS — M545 Low back pain: Secondary | ICD-10-CM | POA: Diagnosis not present

## 2017-03-27 DIAGNOSIS — M25552 Pain in left hip: Secondary | ICD-10-CM | POA: Diagnosis not present

## 2017-03-27 DIAGNOSIS — Z96642 Presence of left artificial hip joint: Secondary | ICD-10-CM | POA: Diagnosis not present

## 2017-03-27 DIAGNOSIS — M1612 Unilateral primary osteoarthritis, left hip: Secondary | ICD-10-CM | POA: Diagnosis not present

## 2017-03-27 DIAGNOSIS — G8929 Other chronic pain: Secondary | ICD-10-CM | POA: Diagnosis not present

## 2017-04-11 DIAGNOSIS — M47816 Spondylosis without myelopathy or radiculopathy, lumbar region: Secondary | ICD-10-CM | POA: Diagnosis not present

## 2017-04-11 DIAGNOSIS — M5136 Other intervertebral disc degeneration, lumbar region: Secondary | ICD-10-CM | POA: Diagnosis not present

## 2017-04-11 DIAGNOSIS — Z981 Arthrodesis status: Secondary | ICD-10-CM | POA: Diagnosis not present

## 2017-04-27 DIAGNOSIS — K219 Gastro-esophageal reflux disease without esophagitis: Secondary | ICD-10-CM | POA: Diagnosis not present

## 2017-04-27 DIAGNOSIS — R42 Dizziness and giddiness: Secondary | ICD-10-CM | POA: Diagnosis not present

## 2017-04-27 DIAGNOSIS — R51 Headache: Secondary | ICD-10-CM | POA: Diagnosis not present

## 2017-04-27 DIAGNOSIS — Z79899 Other long term (current) drug therapy: Secondary | ICD-10-CM | POA: Diagnosis not present

## 2017-04-27 DIAGNOSIS — E039 Hypothyroidism, unspecified: Secondary | ICD-10-CM | POA: Diagnosis not present

## 2017-04-27 DIAGNOSIS — I959 Hypotension, unspecified: Secondary | ICD-10-CM | POA: Diagnosis not present

## 2017-04-27 DIAGNOSIS — Z85820 Personal history of malignant melanoma of skin: Secondary | ICD-10-CM | POA: Diagnosis not present

## 2017-04-27 DIAGNOSIS — N400 Enlarged prostate without lower urinary tract symptoms: Secondary | ICD-10-CM | POA: Diagnosis not present

## 2017-04-27 DIAGNOSIS — I10 Essential (primary) hypertension: Secondary | ICD-10-CM | POA: Diagnosis not present

## 2017-05-02 DIAGNOSIS — M47816 Spondylosis without myelopathy or radiculopathy, lumbar region: Secondary | ICD-10-CM | POA: Diagnosis not present

## 2017-05-08 DIAGNOSIS — E039 Hypothyroidism, unspecified: Secondary | ICD-10-CM | POA: Diagnosis not present

## 2017-05-08 DIAGNOSIS — Z6829 Body mass index (BMI) 29.0-29.9, adult: Secondary | ICD-10-CM | POA: Diagnosis not present

## 2017-05-08 DIAGNOSIS — R42 Dizziness and giddiness: Secondary | ICD-10-CM | POA: Diagnosis not present

## 2017-05-08 DIAGNOSIS — N182 Chronic kidney disease, stage 2 (mild): Secondary | ICD-10-CM | POA: Diagnosis not present

## 2017-05-08 DIAGNOSIS — I10 Essential (primary) hypertension: Secondary | ICD-10-CM | POA: Diagnosis not present

## 2017-05-08 DIAGNOSIS — M1612 Unilateral primary osteoarthritis, left hip: Secondary | ICD-10-CM | POA: Diagnosis not present

## 2017-06-11 DIAGNOSIS — G459 Transient cerebral ischemic attack, unspecified: Secondary | ICD-10-CM | POA: Diagnosis not present

## 2017-06-11 DIAGNOSIS — Z79899 Other long term (current) drug therapy: Secondary | ICD-10-CM | POA: Diagnosis not present

## 2017-06-11 DIAGNOSIS — E039 Hypothyroidism, unspecified: Secondary | ICD-10-CM | POA: Diagnosis not present

## 2017-06-11 DIAGNOSIS — Z87891 Personal history of nicotine dependence: Secondary | ICD-10-CM | POA: Diagnosis not present

## 2017-06-11 DIAGNOSIS — M1990 Unspecified osteoarthritis, unspecified site: Secondary | ICD-10-CM | POA: Diagnosis not present

## 2017-06-11 DIAGNOSIS — I10 Essential (primary) hypertension: Secondary | ICD-10-CM | POA: Diagnosis not present

## 2017-06-11 DIAGNOSIS — R202 Paresthesia of skin: Secondary | ICD-10-CM | POA: Diagnosis not present

## 2017-06-11 DIAGNOSIS — K219 Gastro-esophageal reflux disease without esophagitis: Secondary | ICD-10-CM | POA: Diagnosis not present

## 2017-06-12 DIAGNOSIS — I10 Essential (primary) hypertension: Secondary | ICD-10-CM | POA: Diagnosis not present

## 2017-06-12 DIAGNOSIS — R531 Weakness: Secondary | ICD-10-CM | POA: Diagnosis not present

## 2017-06-12 DIAGNOSIS — K219 Gastro-esophageal reflux disease without esophagitis: Secondary | ICD-10-CM | POA: Diagnosis not present

## 2017-06-12 DIAGNOSIS — M1990 Unspecified osteoarthritis, unspecified site: Secondary | ICD-10-CM | POA: Diagnosis not present

## 2017-06-12 DIAGNOSIS — R200 Anesthesia of skin: Secondary | ICD-10-CM | POA: Diagnosis not present

## 2017-06-12 DIAGNOSIS — Z8673 Personal history of transient ischemic attack (TIA), and cerebral infarction without residual deficits: Secondary | ICD-10-CM | POA: Diagnosis not present

## 2017-06-12 DIAGNOSIS — Z79899 Other long term (current) drug therapy: Secondary | ICD-10-CM | POA: Diagnosis not present

## 2017-06-12 DIAGNOSIS — I6521 Occlusion and stenosis of right carotid artery: Secondary | ICD-10-CM | POA: Diagnosis not present

## 2017-06-12 DIAGNOSIS — Z85820 Personal history of malignant melanoma of skin: Secondary | ICD-10-CM | POA: Diagnosis not present

## 2017-06-12 DIAGNOSIS — M542 Cervicalgia: Secondary | ICD-10-CM | POA: Diagnosis not present

## 2017-06-12 DIAGNOSIS — N400 Enlarged prostate without lower urinary tract symptoms: Secondary | ICD-10-CM | POA: Diagnosis not present

## 2017-06-12 DIAGNOSIS — I679 Cerebrovascular disease, unspecified: Secondary | ICD-10-CM | POA: Diagnosis not present

## 2017-06-12 DIAGNOSIS — E039 Hypothyroidism, unspecified: Secondary | ICD-10-CM | POA: Diagnosis not present

## 2017-06-12 DIAGNOSIS — Z87891 Personal history of nicotine dependence: Secondary | ICD-10-CM | POA: Diagnosis not present

## 2017-06-12 DIAGNOSIS — R51 Headache: Secondary | ICD-10-CM | POA: Diagnosis not present

## 2017-06-13 DIAGNOSIS — E039 Hypothyroidism, unspecified: Secondary | ICD-10-CM | POA: Diagnosis not present

## 2017-06-16 DIAGNOSIS — I679 Cerebrovascular disease, unspecified: Secondary | ICD-10-CM | POA: Diagnosis not present

## 2017-06-16 DIAGNOSIS — I10 Essential (primary) hypertension: Secondary | ICD-10-CM | POA: Diagnosis not present

## 2017-06-16 DIAGNOSIS — M545 Low back pain: Secondary | ICD-10-CM | POA: Diagnosis not present

## 2017-06-16 DIAGNOSIS — R202 Paresthesia of skin: Secondary | ICD-10-CM | POA: Diagnosis not present

## 2017-06-16 DIAGNOSIS — Z6828 Body mass index (BMI) 28.0-28.9, adult: Secondary | ICD-10-CM | POA: Diagnosis not present

## 2017-06-16 DIAGNOSIS — I69398 Other sequelae of cerebral infarction: Secondary | ICD-10-CM | POA: Diagnosis not present

## 2017-06-16 DIAGNOSIS — R42 Dizziness and giddiness: Secondary | ICD-10-CM | POA: Diagnosis not present

## 2017-06-16 DIAGNOSIS — M542 Cervicalgia: Secondary | ICD-10-CM | POA: Diagnosis not present

## 2017-06-20 DIAGNOSIS — N182 Chronic kidney disease, stage 2 (mild): Secondary | ICD-10-CM | POA: Diagnosis not present

## 2017-06-20 DIAGNOSIS — Z6829 Body mass index (BMI) 29.0-29.9, adult: Secondary | ICD-10-CM | POA: Diagnosis not present

## 2017-06-20 DIAGNOSIS — G459 Transient cerebral ischemic attack, unspecified: Secondary | ICD-10-CM | POA: Diagnosis not present

## 2017-06-20 DIAGNOSIS — M1712 Unilateral primary osteoarthritis, left knee: Secondary | ICD-10-CM | POA: Diagnosis not present

## 2017-06-20 DIAGNOSIS — I10 Essential (primary) hypertension: Secondary | ICD-10-CM | POA: Diagnosis not present

## 2017-06-20 DIAGNOSIS — Z96642 Presence of left artificial hip joint: Secondary | ICD-10-CM | POA: Diagnosis not present

## 2017-06-20 DIAGNOSIS — I6521 Occlusion and stenosis of right carotid artery: Secondary | ICD-10-CM | POA: Diagnosis not present

## 2017-06-20 DIAGNOSIS — E039 Hypothyroidism, unspecified: Secondary | ICD-10-CM | POA: Diagnosis not present

## 2017-06-21 ENCOUNTER — Other Ambulatory Visit: Payer: Self-pay

## 2017-06-21 DIAGNOSIS — I6521 Occlusion and stenosis of right carotid artery: Secondary | ICD-10-CM

## 2017-06-22 DIAGNOSIS — R42 Dizziness and giddiness: Secondary | ICD-10-CM | POA: Diagnosis not present

## 2017-06-22 DIAGNOSIS — Z23 Encounter for immunization: Secondary | ICD-10-CM | POA: Diagnosis not present

## 2017-06-22 DIAGNOSIS — I517 Cardiomegaly: Secondary | ICD-10-CM | POA: Diagnosis not present

## 2017-06-22 DIAGNOSIS — I6521 Occlusion and stenosis of right carotid artery: Secondary | ICD-10-CM | POA: Diagnosis not present

## 2017-06-22 DIAGNOSIS — I6523 Occlusion and stenosis of bilateral carotid arteries: Secondary | ICD-10-CM | POA: Diagnosis not present

## 2017-06-23 DIAGNOSIS — N182 Chronic kidney disease, stage 2 (mild): Secondary | ICD-10-CM | POA: Diagnosis not present

## 2017-06-23 DIAGNOSIS — E039 Hypothyroidism, unspecified: Secondary | ICD-10-CM | POA: Diagnosis not present

## 2017-06-23 DIAGNOSIS — I69351 Hemiplegia and hemiparesis following cerebral infarction affecting right dominant side: Secondary | ICD-10-CM | POA: Diagnosis not present

## 2017-06-23 DIAGNOSIS — M19041 Primary osteoarthritis, right hand: Secondary | ICD-10-CM | POA: Diagnosis not present

## 2017-06-23 DIAGNOSIS — I129 Hypertensive chronic kidney disease with stage 1 through stage 4 chronic kidney disease, or unspecified chronic kidney disease: Secondary | ICD-10-CM | POA: Diagnosis not present

## 2017-06-23 DIAGNOSIS — K219 Gastro-esophageal reflux disease without esophagitis: Secondary | ICD-10-CM | POA: Diagnosis not present

## 2017-06-27 DIAGNOSIS — K219 Gastro-esophageal reflux disease without esophagitis: Secondary | ICD-10-CM | POA: Diagnosis not present

## 2017-06-27 DIAGNOSIS — N182 Chronic kidney disease, stage 2 (mild): Secondary | ICD-10-CM | POA: Diagnosis not present

## 2017-06-27 DIAGNOSIS — I69351 Hemiplegia and hemiparesis following cerebral infarction affecting right dominant side: Secondary | ICD-10-CM | POA: Diagnosis not present

## 2017-06-27 DIAGNOSIS — E039 Hypothyroidism, unspecified: Secondary | ICD-10-CM | POA: Diagnosis not present

## 2017-06-27 DIAGNOSIS — M19041 Primary osteoarthritis, right hand: Secondary | ICD-10-CM | POA: Diagnosis not present

## 2017-06-27 DIAGNOSIS — I129 Hypertensive chronic kidney disease with stage 1 through stage 4 chronic kidney disease, or unspecified chronic kidney disease: Secondary | ICD-10-CM | POA: Diagnosis not present

## 2017-06-28 DIAGNOSIS — E039 Hypothyroidism, unspecified: Secondary | ICD-10-CM | POA: Diagnosis not present

## 2017-06-28 DIAGNOSIS — I69351 Hemiplegia and hemiparesis following cerebral infarction affecting right dominant side: Secondary | ICD-10-CM | POA: Diagnosis not present

## 2017-06-28 DIAGNOSIS — I129 Hypertensive chronic kidney disease with stage 1 through stage 4 chronic kidney disease, or unspecified chronic kidney disease: Secondary | ICD-10-CM | POA: Diagnosis not present

## 2017-06-28 DIAGNOSIS — M19041 Primary osteoarthritis, right hand: Secondary | ICD-10-CM | POA: Diagnosis not present

## 2017-06-28 DIAGNOSIS — K219 Gastro-esophageal reflux disease without esophagitis: Secondary | ICD-10-CM | POA: Diagnosis not present

## 2017-06-28 DIAGNOSIS — N182 Chronic kidney disease, stage 2 (mild): Secondary | ICD-10-CM | POA: Diagnosis not present

## 2017-06-29 DIAGNOSIS — E039 Hypothyroidism, unspecified: Secondary | ICD-10-CM | POA: Diagnosis not present

## 2017-06-29 DIAGNOSIS — K219 Gastro-esophageal reflux disease without esophagitis: Secondary | ICD-10-CM | POA: Diagnosis not present

## 2017-06-29 DIAGNOSIS — M19041 Primary osteoarthritis, right hand: Secondary | ICD-10-CM | POA: Diagnosis not present

## 2017-06-29 DIAGNOSIS — N182 Chronic kidney disease, stage 2 (mild): Secondary | ICD-10-CM | POA: Diagnosis not present

## 2017-06-29 DIAGNOSIS — I129 Hypertensive chronic kidney disease with stage 1 through stage 4 chronic kidney disease, or unspecified chronic kidney disease: Secondary | ICD-10-CM | POA: Diagnosis not present

## 2017-06-29 DIAGNOSIS — I69351 Hemiplegia and hemiparesis following cerebral infarction affecting right dominant side: Secondary | ICD-10-CM | POA: Diagnosis not present

## 2017-07-03 DIAGNOSIS — E039 Hypothyroidism, unspecified: Secondary | ICD-10-CM | POA: Diagnosis not present

## 2017-07-03 DIAGNOSIS — K219 Gastro-esophageal reflux disease without esophagitis: Secondary | ICD-10-CM | POA: Diagnosis not present

## 2017-07-03 DIAGNOSIS — I69351 Hemiplegia and hemiparesis following cerebral infarction affecting right dominant side: Secondary | ICD-10-CM | POA: Diagnosis not present

## 2017-07-03 DIAGNOSIS — M19041 Primary osteoarthritis, right hand: Secondary | ICD-10-CM | POA: Diagnosis not present

## 2017-07-03 DIAGNOSIS — I129 Hypertensive chronic kidney disease with stage 1 through stage 4 chronic kidney disease, or unspecified chronic kidney disease: Secondary | ICD-10-CM | POA: Diagnosis not present

## 2017-07-03 DIAGNOSIS — N182 Chronic kidney disease, stage 2 (mild): Secondary | ICD-10-CM | POA: Diagnosis not present

## 2017-07-04 DIAGNOSIS — I129 Hypertensive chronic kidney disease with stage 1 through stage 4 chronic kidney disease, or unspecified chronic kidney disease: Secondary | ICD-10-CM | POA: Diagnosis not present

## 2017-07-04 DIAGNOSIS — E039 Hypothyroidism, unspecified: Secondary | ICD-10-CM | POA: Diagnosis not present

## 2017-07-04 DIAGNOSIS — I69351 Hemiplegia and hemiparesis following cerebral infarction affecting right dominant side: Secondary | ICD-10-CM | POA: Diagnosis not present

## 2017-07-04 DIAGNOSIS — N182 Chronic kidney disease, stage 2 (mild): Secondary | ICD-10-CM | POA: Diagnosis not present

## 2017-07-04 DIAGNOSIS — M19041 Primary osteoarthritis, right hand: Secondary | ICD-10-CM | POA: Diagnosis not present

## 2017-07-04 DIAGNOSIS — K219 Gastro-esophageal reflux disease without esophagitis: Secondary | ICD-10-CM | POA: Diagnosis not present

## 2017-07-06 DIAGNOSIS — M19041 Primary osteoarthritis, right hand: Secondary | ICD-10-CM | POA: Diagnosis not present

## 2017-07-06 DIAGNOSIS — I69351 Hemiplegia and hemiparesis following cerebral infarction affecting right dominant side: Secondary | ICD-10-CM | POA: Diagnosis not present

## 2017-07-06 DIAGNOSIS — I129 Hypertensive chronic kidney disease with stage 1 through stage 4 chronic kidney disease, or unspecified chronic kidney disease: Secondary | ICD-10-CM | POA: Diagnosis not present

## 2017-07-06 DIAGNOSIS — E039 Hypothyroidism, unspecified: Secondary | ICD-10-CM | POA: Diagnosis not present

## 2017-07-06 DIAGNOSIS — K219 Gastro-esophageal reflux disease without esophagitis: Secondary | ICD-10-CM | POA: Diagnosis not present

## 2017-07-06 DIAGNOSIS — N182 Chronic kidney disease, stage 2 (mild): Secondary | ICD-10-CM | POA: Diagnosis not present

## 2017-07-10 DIAGNOSIS — E039 Hypothyroidism, unspecified: Secondary | ICD-10-CM | POA: Diagnosis not present

## 2017-07-10 DIAGNOSIS — K219 Gastro-esophageal reflux disease without esophagitis: Secondary | ICD-10-CM | POA: Diagnosis not present

## 2017-07-10 DIAGNOSIS — I129 Hypertensive chronic kidney disease with stage 1 through stage 4 chronic kidney disease, or unspecified chronic kidney disease: Secondary | ICD-10-CM | POA: Diagnosis not present

## 2017-07-10 DIAGNOSIS — N182 Chronic kidney disease, stage 2 (mild): Secondary | ICD-10-CM | POA: Diagnosis not present

## 2017-07-10 DIAGNOSIS — I69351 Hemiplegia and hemiparesis following cerebral infarction affecting right dominant side: Secondary | ICD-10-CM | POA: Diagnosis not present

## 2017-07-10 DIAGNOSIS — M19041 Primary osteoarthritis, right hand: Secondary | ICD-10-CM | POA: Diagnosis not present

## 2017-07-12 DIAGNOSIS — N182 Chronic kidney disease, stage 2 (mild): Secondary | ICD-10-CM | POA: Diagnosis not present

## 2017-07-12 DIAGNOSIS — K219 Gastro-esophageal reflux disease without esophagitis: Secondary | ICD-10-CM | POA: Diagnosis not present

## 2017-07-12 DIAGNOSIS — M19041 Primary osteoarthritis, right hand: Secondary | ICD-10-CM | POA: Diagnosis not present

## 2017-07-12 DIAGNOSIS — E039 Hypothyroidism, unspecified: Secondary | ICD-10-CM | POA: Diagnosis not present

## 2017-07-12 DIAGNOSIS — I69351 Hemiplegia and hemiparesis following cerebral infarction affecting right dominant side: Secondary | ICD-10-CM | POA: Diagnosis not present

## 2017-07-12 DIAGNOSIS — I129 Hypertensive chronic kidney disease with stage 1 through stage 4 chronic kidney disease, or unspecified chronic kidney disease: Secondary | ICD-10-CM | POA: Diagnosis not present

## 2017-07-13 DIAGNOSIS — K219 Gastro-esophageal reflux disease without esophagitis: Secondary | ICD-10-CM | POA: Diagnosis not present

## 2017-07-13 DIAGNOSIS — I129 Hypertensive chronic kidney disease with stage 1 through stage 4 chronic kidney disease, or unspecified chronic kidney disease: Secondary | ICD-10-CM | POA: Diagnosis not present

## 2017-07-13 DIAGNOSIS — N182 Chronic kidney disease, stage 2 (mild): Secondary | ICD-10-CM | POA: Diagnosis not present

## 2017-07-13 DIAGNOSIS — I69351 Hemiplegia and hemiparesis following cerebral infarction affecting right dominant side: Secondary | ICD-10-CM | POA: Diagnosis not present

## 2017-07-13 DIAGNOSIS — E039 Hypothyroidism, unspecified: Secondary | ICD-10-CM | POA: Diagnosis not present

## 2017-07-13 DIAGNOSIS — M19041 Primary osteoarthritis, right hand: Secondary | ICD-10-CM | POA: Diagnosis not present

## 2017-07-17 DIAGNOSIS — I69351 Hemiplegia and hemiparesis following cerebral infarction affecting right dominant side: Secondary | ICD-10-CM | POA: Diagnosis not present

## 2017-07-17 DIAGNOSIS — I129 Hypertensive chronic kidney disease with stage 1 through stage 4 chronic kidney disease, or unspecified chronic kidney disease: Secondary | ICD-10-CM | POA: Diagnosis not present

## 2017-07-17 DIAGNOSIS — N182 Chronic kidney disease, stage 2 (mild): Secondary | ICD-10-CM | POA: Diagnosis not present

## 2017-07-17 DIAGNOSIS — K219 Gastro-esophageal reflux disease without esophagitis: Secondary | ICD-10-CM | POA: Diagnosis not present

## 2017-07-17 DIAGNOSIS — E039 Hypothyroidism, unspecified: Secondary | ICD-10-CM | POA: Diagnosis not present

## 2017-07-17 DIAGNOSIS — M19041 Primary osteoarthritis, right hand: Secondary | ICD-10-CM | POA: Diagnosis not present

## 2017-07-19 ENCOUNTER — Ambulatory Visit (HOSPITAL_COMMUNITY)
Admission: RE | Admit: 2017-07-19 | Discharge: 2017-07-19 | Disposition: A | Discharge status: Home / Self Care | Payer: Medicare Other | Source: Ambulatory Visit | Attending: Vascular Surgery | Admitting: Vascular Surgery

## 2017-07-19 DIAGNOSIS — C61 Malignant neoplasm of prostate: Secondary | ICD-10-CM | POA: Diagnosis not present

## 2017-07-19 DIAGNOSIS — I6521 Occlusion and stenosis of right carotid artery: Secondary | ICD-10-CM | POA: Insufficient documentation

## 2017-07-19 LAB — VAS US CAROTID
RCCADSYS: -81 cm/s
RCCAPDIAS: 16 cm/s
RIGHT CCA MID DIAS: 18 cm/s
RIGHT ECA DIAS: -11 cm/s
Right CCA prox sys: 94 cm/s

## 2017-07-20 DIAGNOSIS — M19041 Primary osteoarthritis, right hand: Secondary | ICD-10-CM | POA: Diagnosis not present

## 2017-07-20 DIAGNOSIS — K219 Gastro-esophageal reflux disease without esophagitis: Secondary | ICD-10-CM | POA: Diagnosis not present

## 2017-07-20 DIAGNOSIS — I129 Hypertensive chronic kidney disease with stage 1 through stage 4 chronic kidney disease, or unspecified chronic kidney disease: Secondary | ICD-10-CM | POA: Diagnosis not present

## 2017-07-20 DIAGNOSIS — E039 Hypothyroidism, unspecified: Secondary | ICD-10-CM | POA: Diagnosis not present

## 2017-07-20 DIAGNOSIS — I69351 Hemiplegia and hemiparesis following cerebral infarction affecting right dominant side: Secondary | ICD-10-CM | POA: Diagnosis not present

## 2017-07-20 DIAGNOSIS — N182 Chronic kidney disease, stage 2 (mild): Secondary | ICD-10-CM | POA: Diagnosis not present

## 2017-07-21 ENCOUNTER — Ambulatory Visit (INDEPENDENT_AMBULATORY_CARE_PROVIDER_SITE_OTHER): Payer: Medicare Other | Admitting: Vascular Surgery

## 2017-07-21 ENCOUNTER — Encounter: Payer: Self-pay | Admitting: Vascular Surgery

## 2017-07-21 DIAGNOSIS — I6523 Occlusion and stenosis of bilateral carotid arteries: Secondary | ICD-10-CM

## 2017-07-21 DIAGNOSIS — I6521 Occlusion and stenosis of right carotid artery: Secondary | ICD-10-CM | POA: Diagnosis not present

## 2017-07-21 DIAGNOSIS — I779 Disorder of arteries and arterioles, unspecified: Secondary | ICD-10-CM | POA: Insufficient documentation

## 2017-07-21 DIAGNOSIS — I63111 Cerebral infarction due to embolism of right vertebral artery: Secondary | ICD-10-CM

## 2017-07-21 DIAGNOSIS — I639 Cerebral infarction, unspecified: Secondary | ICD-10-CM | POA: Insufficient documentation

## 2017-07-21 DIAGNOSIS — I739 Peripheral vascular disease, unspecified: Secondary | ICD-10-CM

## 2017-07-21 NOTE — Progress Notes (Signed)
New Carotid Patient  Requested by:  Curlene Labrum, MD [place], [place] [id number]  Reason for consultation: carotid stenosis   History of Present Illness   Scott [name] is a 83 y.o. (August 02, 1928) male who presents with chief complaint: headache.  A few months ago, this patient developed a severe retroorbital headache behind his right eye.  He went to the ED for evaluation.  Reported his CT Head did not demonstrate anything.  This patient then had recurrence of headache with some dizziness and right side gait imbalance.  He went to the ED and had a MRI which demonstrated prior bilateral CVA and new L and R posterior brain CVA.  He underwent R MRA and B carotid duplex.  B carotid duplex demonstrated: RICA 19-14% stenosis, LICA minimal stenosis with bilateral vertebral artery flow.  The MRA replicated the R ICA stenosis and demonstrated a R VA stenosis in this dominant VA.    Patient has prior history of dizziness and gait instability.  The patient has never had amaurosis fugax or monocular blindness.  The patient has never had facial drooping or hemiplegia.  The patient has never had receptive or expressive aphasia.   The patient's previous neurologic deficits have resolved.  The patient's risks factors for carotid disease include: HLD, HTN, and prior smoking.   Past Medical History:  Diagnosis Date  . Arrhythmia   . Chronic kidney disease   . Hyperlipidemia   . Hypertension   . Hypothyroidism     Past Surgical History:  Procedure Laterality Date  . CATARACT EXTRACTION    . COLONOSCOPY    . KNEE SURGERY      Social History   Social History  . Marital status: Married    Spouse name: N/A  . Number of children: N/A  . Years of education: N/A   Occupational History  . Not on file.   Social History Main Topics  . Smoking status: Former Smoker    Start date: 10/17/1965    Quit date: 10/17/1978  . Smokeless tobacco: Never Used  . Alcohol use No  . Drug use: No  .  Sexual activity: Not on file   Other Topics Concern  . Not on file   Social History Narrative  . No narrative on file    Family History  Problem Relation Age of Onset  . Coronary artery disease Mother     Current Outpatient Prescriptions  Medication Sig Dispense Refill  . acetaminophen (TYLENOL) 500 MG tablet Take 500 mg by mouth every 6 (six) hours as needed.    Marland Kitchen aspirin EC 81 MG tablet Take 81 mg by mouth daily.    Marland Kitchen atorvastatin (LIPITOR) 10 MG tablet Take 10 mg by mouth daily.    Marland Kitchen doxazosin (CARDURA) 8 MG tablet Take 8 mg by mouth at bedtime.    Marland Kitchen labetalol (NORMODYNE) 200 MG tablet Take 200 mg by mouth 2 (two) times daily.    Marland Kitchen levothyroxine (SYNTHROID, LEVOTHROID) 137 MCG tablet Take 137 mcg by mouth daily before breakfast.    . lisinopril (PRINIVIL,ZESTRIL) 40 MG tablet Take 40 mg by mouth daily.    Marland Kitchen omeprazole (PRILOSEC) 20 MG capsule Take 20 mg by mouth daily.    Marland Kitchen oxyCODONE-acetaminophen (PERCOCET/ROXICET) 5-325 MG tablet Take 1 tablet by mouth every 4 (four) hours as needed for severe pain.     No current facility-administered medications for this visit.     Allergies  Allergen Reactions  .  Tramadol     Pt noticed elevated blood pressure when taking.    REVIEW OF SYSTEMS (negative unless checked):   Cardiac:  []  Chest pain or chest pressure? []  Shortness of breath upon activity? []  Shortness of breath when lying flat? []  Irregular heart rhythm?  Vascular:  []  Pain in calf, thigh, or hip brought on by walking? []  Pain in feet at night that wakes you up from your sleep? []  Blood clot in your veins? []  Leg swelling?  Pulmonary:  []  Oxygen at home? []  Productive cough? []  Wheezing?  Neurologic:  []  Sudden weakness in arms or legs? []  Sudden numbness in arms or legs? []  Sudden onset of difficult speaking or slurred speech? []  Temporary loss of vision in one eye? [x]  Problems with dizziness?  Gastrointestinal:  []  Blood in stool? []  Vomited  blood?  Genitourinary:  []  Burning when urinating? []  Blood in urine?  Psychiatric:  []  Major depression  Hematologic:  []  Bleeding problems? []  Problems with blood clotting?  Dermatologic:  []  Rashes or ulcers?  Constitutional:  []  Fever or chills?  Ear/Nose/Throat:  []  Change in hearing? []  Nose bleeds? []  Sore throat?  Musculoskeletal:  []  Back pain? []  Joint pain? []  Muscle pain?   For VQI Use Only   PRE-ADM LIVING Home  AMB STATUS Ambulatory  CAD Sx None  PRIOR CHF None  STRESS TEST No    Physical Examination     Vitals:   07/21/17 0934  BP: 132/64  Pulse: 66  Resp: 16  SpO2: 98%  Weight: 190 lb 12.8 oz (86.5 kg)  Height: 5\' 6"  (1.676 m)   Body mass index is 30.8 kg/m.  General Alert, O x 3, WD, NAD  Head [place]/AT,    Ear/Nose/ Throat Hearing grossly intact, nares without erythema or drainage, oropharynx with  Erythema without Exudate, Mallampati score: 3,   Eyes PERRLA, EOMI, Postsurg lenses  Neck Supple, mid-line trachea, palpable lymph nodes  Pulmonary Sym exp, good B air movt, CTA B  Cardiac RRR, Nl S1, S2, no Murmurs, No rubs, No S3,S4  Vascular Vessel Right Left  Radial Palpable Palpable  Brachial Palpable Palpable  Carotid Palpable, No Bruit Palpable, No Bruit  Aorta Not palpable N/A  Femoral Palpable Palpable  Popliteal Not palpable Not palpable  PT Palpable Palpable  DP Palpable Palpable    Gastro- intestinal soft, non-distended, non-tender to palpation, No guarding or rebound, no HSM, no masses, no CVAT B, No palpable prominent aortic pulse,    Musculo- skeletal M/S 5/5 throughout  , Extremities without ischemic changes  , No edema present, No visible varicosities , No Lipodermatosclerosis present  Neurologic Cranial nerves 2-12 intact , Pain and light touch intact in extremities , Motor exam as listed above  Psychiatric Judgement intact, Mood & affect appropriate for pt's clinical situation  Dermatologic See M/S exam for  extremity exam, No rashes otherwise noted  Lymphatic  Palpable lymph nodes: None    20 pages of outside documents were reviewed including: outpatient nursing home and ED notes.   Medical Decision Making   Scott [name] is a 83 y.o. male who presents with: likely asx R ICA stenosis 60-79%, possible sx R VA stenosis, B CVA, likely asx L ICA minimal stenosis   Pt's most recent MRI demonstrates posterior circulation strokes that would be more consistent with VA disease. As I don't usually do vertebral artery interventions, I will defer a carotid and vertebral angiogram to Neurointerventional Radiology, Dr. Marjory Lies. If this  fails to demonstrate any vertebral artery disease, could consider R CEA as NASCET would support such. I doubt however that the minimal disease in the L ICA accounts for the L posterior brain stroke. I will have the patient follow up in 4 weeks. I discussed in depth with the patient the nature of atherosclerosis, and emphasized the importance of maximal medical management including strict control of blood pressure, blood glucose, and lipid levels, obtaining regular exercise, antiplatelet agents, and cessation of smoking.   The patient is currently on a statin: Lipitor.  The patient is currently on an anti-platelet: ASA.  The patient is aware that without maximal medical management the underlying atherosclerotic disease process will progress, limiting the benefit of any interventions.  Thank you for allowing [place] to participate in this patient's care.   Adele Barthel, MD, FACS [facility] Office: [phone] Pager: [phone]  07/21/2017, 4:49 PM

## 2017-07-24 ENCOUNTER — Other Ambulatory Visit (HOSPITAL_COMMUNITY): Payer: Self-pay | Admitting: Interventional Radiology

## 2017-07-24 DIAGNOSIS — I69351 Hemiplegia and hemiparesis following cerebral infarction affecting right dominant side: Secondary | ICD-10-CM | POA: Diagnosis not present

## 2017-07-24 DIAGNOSIS — E039 Hypothyroidism, unspecified: Secondary | ICD-10-CM | POA: Diagnosis not present

## 2017-07-24 DIAGNOSIS — I129 Hypertensive chronic kidney disease with stage 1 through stage 4 chronic kidney disease, or unspecified chronic kidney disease: Secondary | ICD-10-CM | POA: Diagnosis not present

## 2017-07-24 DIAGNOSIS — N182 Chronic kidney disease, stage 2 (mild): Secondary | ICD-10-CM | POA: Diagnosis not present

## 2017-07-24 DIAGNOSIS — M19041 Primary osteoarthritis, right hand: Secondary | ICD-10-CM | POA: Diagnosis not present

## 2017-07-24 DIAGNOSIS — K219 Gastro-esophageal reflux disease without esophagitis: Secondary | ICD-10-CM | POA: Diagnosis not present

## 2017-07-24 DIAGNOSIS — I771 Stricture of artery: Secondary | ICD-10-CM

## 2017-07-26 ENCOUNTER — Ambulatory Visit (INDEPENDENT_AMBULATORY_CARE_PROVIDER_SITE_OTHER): Payer: Medicare Other | Admitting: Urology

## 2017-07-26 ENCOUNTER — Encounter: Payer: Self-pay | Admitting: Family Medicine

## 2017-07-26 DIAGNOSIS — R351 Nocturia: Secondary | ICD-10-CM

## 2017-07-26 DIAGNOSIS — C61 Malignant neoplasm of prostate: Secondary | ICD-10-CM | POA: Diagnosis not present

## 2017-07-26 DIAGNOSIS — R3915 Urgency of urination: Secondary | ICD-10-CM | POA: Diagnosis not present

## 2017-07-27 DIAGNOSIS — I129 Hypertensive chronic kidney disease with stage 1 through stage 4 chronic kidney disease, or unspecified chronic kidney disease: Secondary | ICD-10-CM | POA: Diagnosis not present

## 2017-07-27 DIAGNOSIS — I69351 Hemiplegia and hemiparesis following cerebral infarction affecting right dominant side: Secondary | ICD-10-CM | POA: Diagnosis not present

## 2017-07-27 DIAGNOSIS — K219 Gastro-esophageal reflux disease without esophagitis: Secondary | ICD-10-CM | POA: Diagnosis not present

## 2017-07-27 DIAGNOSIS — E039 Hypothyroidism, unspecified: Secondary | ICD-10-CM | POA: Diagnosis not present

## 2017-07-27 DIAGNOSIS — N182 Chronic kidney disease, stage 2 (mild): Secondary | ICD-10-CM | POA: Diagnosis not present

## 2017-07-27 DIAGNOSIS — M19041 Primary osteoarthritis, right hand: Secondary | ICD-10-CM | POA: Diagnosis not present

## 2017-07-28 ENCOUNTER — Other Ambulatory Visit (HOSPITAL_COMMUNITY): Payer: Self-pay | Admitting: Interventional Radiology

## 2017-07-28 ENCOUNTER — Ambulatory Visit (HOSPITAL_COMMUNITY)
Admission: RE | Admit: 2017-07-28 | Discharge: 2017-07-28 | Disposition: A | Discharge status: Home / Self Care | Payer: Medicare Other | Source: Ambulatory Visit | Attending: Interventional Radiology | Admitting: Interventional Radiology

## 2017-07-28 DIAGNOSIS — I6521 Occlusion and stenosis of right carotid artery: Secondary | ICD-10-CM | POA: Diagnosis not present

## 2017-07-28 DIAGNOSIS — I771 Stricture of artery: Secondary | ICD-10-CM

## 2017-07-28 DIAGNOSIS — I6501 Occlusion and stenosis of right vertebral artery: Secondary | ICD-10-CM | POA: Diagnosis not present

## 2017-07-28 HISTORY — PX: IR RADIOLOGIST EVAL & MGMT: IMG5224

## 2017-07-31 DIAGNOSIS — K219 Gastro-esophageal reflux disease without esophagitis: Secondary | ICD-10-CM | POA: Diagnosis not present

## 2017-07-31 DIAGNOSIS — I69351 Hemiplegia and hemiparesis following cerebral infarction affecting right dominant side: Secondary | ICD-10-CM | POA: Diagnosis not present

## 2017-07-31 DIAGNOSIS — M19041 Primary osteoarthritis, right hand: Secondary | ICD-10-CM | POA: Diagnosis not present

## 2017-07-31 DIAGNOSIS — E039 Hypothyroidism, unspecified: Secondary | ICD-10-CM | POA: Diagnosis not present

## 2017-07-31 DIAGNOSIS — N182 Chronic kidney disease, stage 2 (mild): Secondary | ICD-10-CM | POA: Diagnosis not present

## 2017-07-31 DIAGNOSIS — I129 Hypertensive chronic kidney disease with stage 1 through stage 4 chronic kidney disease, or unspecified chronic kidney disease: Secondary | ICD-10-CM | POA: Diagnosis not present

## 2017-08-01 ENCOUNTER — Encounter (HOSPITAL_COMMUNITY): Payer: Self-pay | Admitting: Interventional Radiology

## 2017-08-02 ENCOUNTER — Ambulatory Visit (HOSPITAL_COMMUNITY): Payer: Medicare Other

## 2017-08-03 ENCOUNTER — Other Ambulatory Visit: Payer: Self-pay | Admitting: General Surgery

## 2017-08-03 DIAGNOSIS — I129 Hypertensive chronic kidney disease with stage 1 through stage 4 chronic kidney disease, or unspecified chronic kidney disease: Secondary | ICD-10-CM | POA: Diagnosis not present

## 2017-08-03 DIAGNOSIS — K219 Gastro-esophageal reflux disease without esophagitis: Secondary | ICD-10-CM | POA: Diagnosis not present

## 2017-08-03 DIAGNOSIS — E039 Hypothyroidism, unspecified: Secondary | ICD-10-CM | POA: Diagnosis not present

## 2017-08-03 DIAGNOSIS — I69351 Hemiplegia and hemiparesis following cerebral infarction affecting right dominant side: Secondary | ICD-10-CM | POA: Diagnosis not present

## 2017-08-03 DIAGNOSIS — M19041 Primary osteoarthritis, right hand: Secondary | ICD-10-CM | POA: Diagnosis not present

## 2017-08-03 DIAGNOSIS — N182 Chronic kidney disease, stage 2 (mild): Secondary | ICD-10-CM | POA: Diagnosis not present

## 2017-08-04 ENCOUNTER — Ambulatory Visit (HOSPITAL_COMMUNITY)
Admission: RE | Admit: 2017-08-04 | Discharge: 2017-08-04 | Disposition: A | Discharge status: Home / Self Care | Payer: Medicare Other | Source: Ambulatory Visit | Attending: Interventional Radiology | Admitting: Interventional Radiology

## 2017-08-04 ENCOUNTER — Other Ambulatory Visit (HOSPITAL_COMMUNITY): Payer: Self-pay | Admitting: Interventional Radiology

## 2017-08-04 DIAGNOSIS — Z7982 Long term (current) use of aspirin: Secondary | ICD-10-CM | POA: Insufficient documentation

## 2017-08-04 DIAGNOSIS — Z7951 Long term (current) use of inhaled steroids: Secondary | ICD-10-CM | POA: Diagnosis not present

## 2017-08-04 DIAGNOSIS — N189 Chronic kidney disease, unspecified: Secondary | ICD-10-CM | POA: Diagnosis not present

## 2017-08-04 DIAGNOSIS — E039 Hypothyroidism, unspecified: Secondary | ICD-10-CM | POA: Diagnosis not present

## 2017-08-04 DIAGNOSIS — I6501 Occlusion and stenosis of right vertebral artery: Secondary | ICD-10-CM | POA: Diagnosis not present

## 2017-08-04 DIAGNOSIS — I771 Stricture of artery: Secondary | ICD-10-CM

## 2017-08-04 DIAGNOSIS — I6602 Occlusion and stenosis of left middle cerebral artery: Secondary | ICD-10-CM | POA: Diagnosis not present

## 2017-08-04 DIAGNOSIS — Z87891 Personal history of nicotine dependence: Secondary | ICD-10-CM | POA: Diagnosis not present

## 2017-08-04 DIAGNOSIS — I129 Hypertensive chronic kidney disease with stage 1 through stage 4 chronic kidney disease, or unspecified chronic kidney disease: Secondary | ICD-10-CM | POA: Insufficient documentation

## 2017-08-04 DIAGNOSIS — G450 Vertebro-basilar artery syndrome: Secondary | ICD-10-CM | POA: Diagnosis not present

## 2017-08-04 DIAGNOSIS — Z7902 Long term (current) use of antithrombotics/antiplatelets: Secondary | ICD-10-CM | POA: Diagnosis not present

## 2017-08-04 DIAGNOSIS — E785 Hyperlipidemia, unspecified: Secondary | ICD-10-CM | POA: Insufficient documentation

## 2017-08-04 DIAGNOSIS — I6521 Occlusion and stenosis of right carotid artery: Secondary | ICD-10-CM | POA: Diagnosis not present

## 2017-08-04 DIAGNOSIS — Z8673 Personal history of transient ischemic attack (TIA), and cerebral infarction without residual deficits: Secondary | ICD-10-CM | POA: Insufficient documentation

## 2017-08-04 DIAGNOSIS — I651 Occlusion and stenosis of basilar artery: Secondary | ICD-10-CM | POA: Diagnosis not present

## 2017-08-04 HISTORY — PX: IR ANGIO INTRA EXTRACRAN SEL COM CAROTID INNOMINATE BILAT MOD SED: IMG5360

## 2017-08-04 HISTORY — PX: IR ANGIO VERTEBRAL SEL VERTEBRAL BILAT MOD SED: IMG5369

## 2017-08-04 LAB — BASIC METABOLIC PANEL
Anion gap: 8 (ref 5–15)
BUN: 17 mg/dL (ref 6–20)
CALCIUM: 8.5 mg/dL — AB (ref 8.9–10.3)
CO2: 26 mmol/L (ref 22–32)
CREATININE: 1.46 mg/dL — AB (ref 0.61–1.24)
Chloride: 102 mmol/L (ref 101–111)
GFR calc non Af Amer: 42 mL/min — ABNORMAL LOW (ref >60)
GFR, EST AFRICAN AMERICAN: 48 mL/min — AB (ref >60)
Glucose, Bld: 92 mg/dL (ref 65–99)
Potassium: 4.3 mmol/L (ref 3.5–5.1)
SODIUM: 136 mmol/L (ref 135–145)

## 2017-08-04 LAB — PROTIME-INR
INR: 1.06
PROTHROMBIN TIME: 13.7 s (ref 11.4–15.2)

## 2017-08-04 LAB — CBC
HCT: 36.9 % — ABNORMAL LOW (ref 39.0–52.0)
Hemoglobin: 12 g/dL — ABNORMAL LOW (ref 13.0–17.0)
MCH: 28.8 pg (ref 26.0–34.0)
MCHC: 32.5 g/dL (ref 30.0–36.0)
MCV: 88.7 fL (ref 78.0–100.0)
PLATELETS: 199 10*3/uL (ref 150–400)
RBC: 4.16 MIL/uL — AB (ref 4.22–5.81)
RDW: 15.4 % (ref 11.5–15.5)
WBC: 6 10*3/uL (ref 4.0–10.5)

## 2017-08-04 IMAGING — XA IR ANGIO VETEBRAL SEL VERTEBRAL BILAT MOD SED
2 series · 3 of 3 positions shown · IV contrast (IODINE)
Comparison: MRI MRA of the brain of [DATE].

CLINICAL DATA: Vertebrobasilar ischemic symptoms of gait imbalance
incoordination. Diffusion weighted ischemic lesions on MRI of the
brain. MRA high-grade stenosis of the right vertebrobasilar
junction.

EXAM:
BILATERAL COMMON CAROTID AND INNOMINATE ANGIOGRAPHY; IR ANGIO
VERTEBRAL SEL VERTEBRAL BILAT MOD SED
TECHNIQUE: Informed written consent was obtained from the patient after a
thorough discussion of the procedural risks, benefits and
alternatives. All questions were addressed. Maximal Sterile Barrier
Technique was utilized including caps, mask, sterile gowns, sterile
gloves, sterile drape, hand hygiene and skin antiseptic. A timeout
was performed prior to the initiation of the procedure.

[Series 10: carotid · 2 of 2 slices shown (1 of 2)]
[im 1/2]
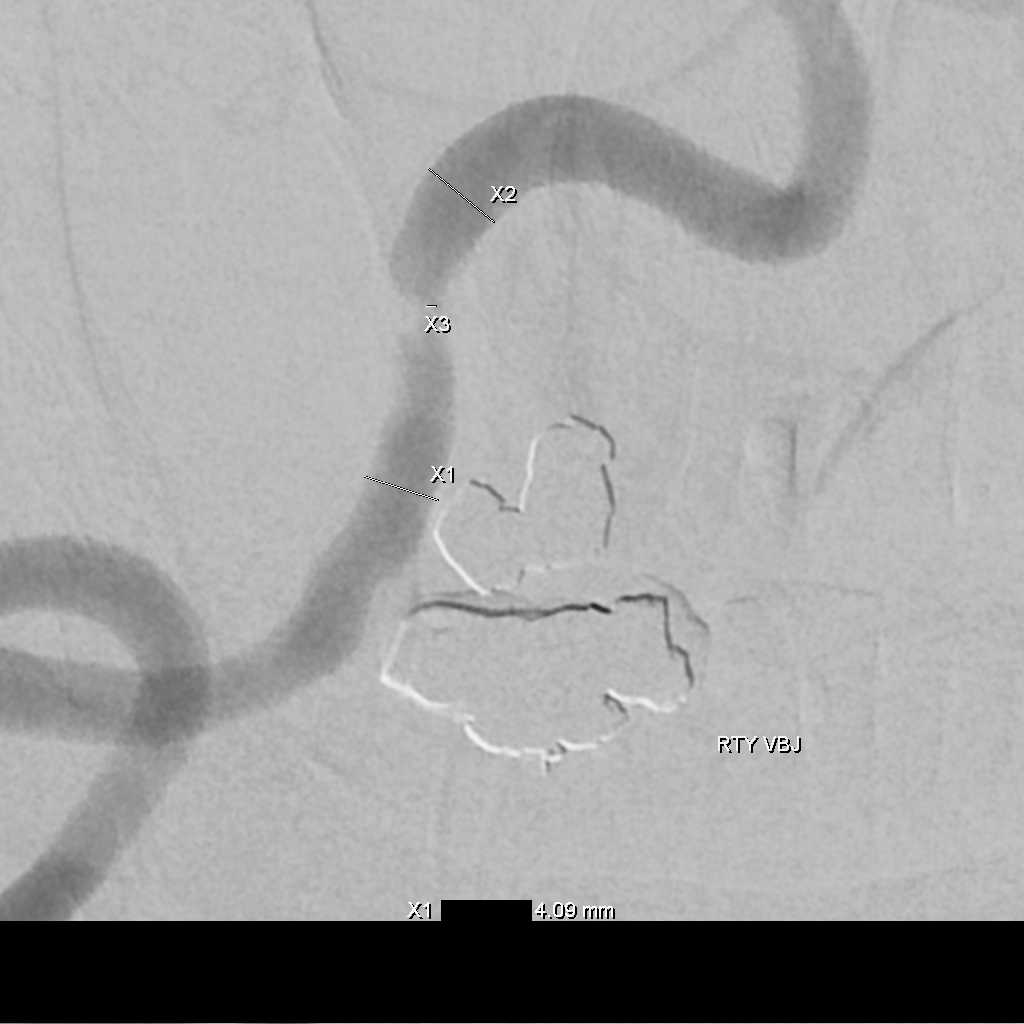
[im 2/2]
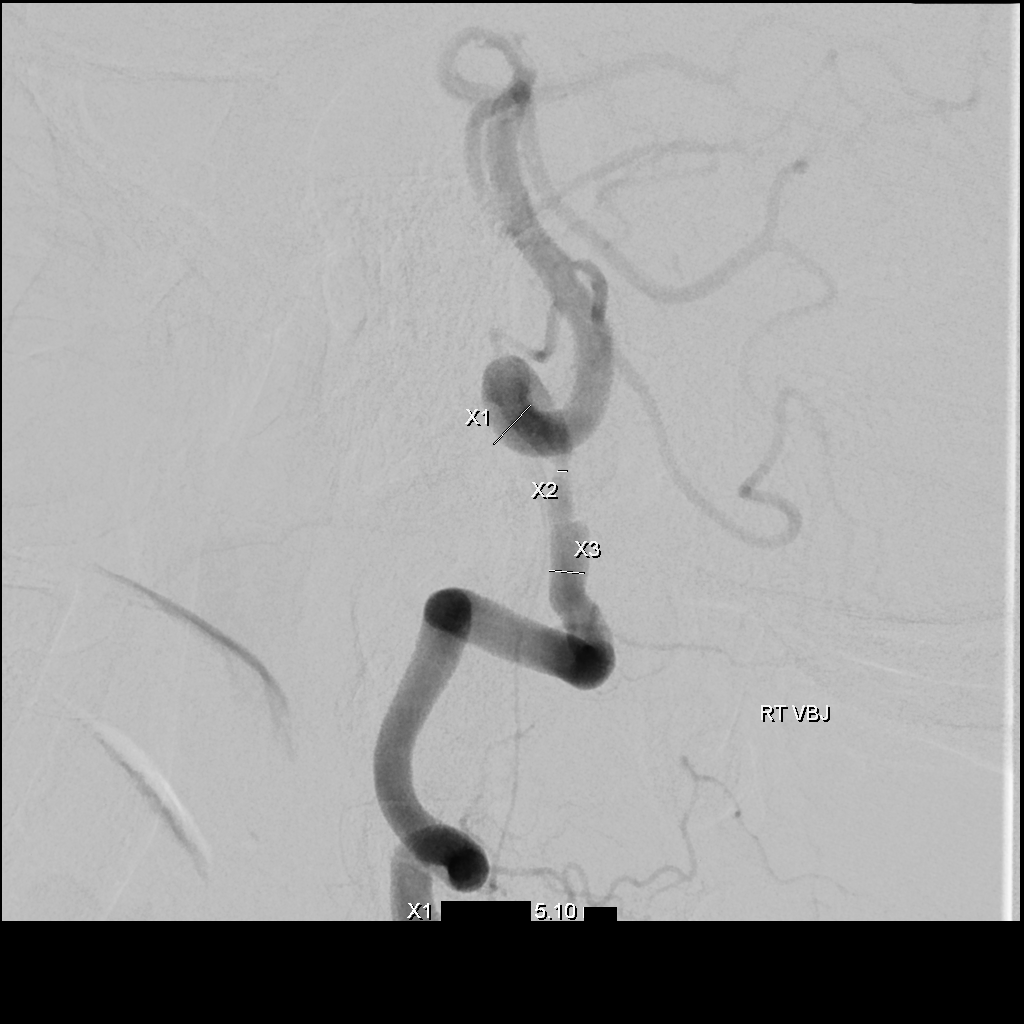

[Series 14: carotid · 1 of 1 slices shown (2 of 2)]
[im 1/1]
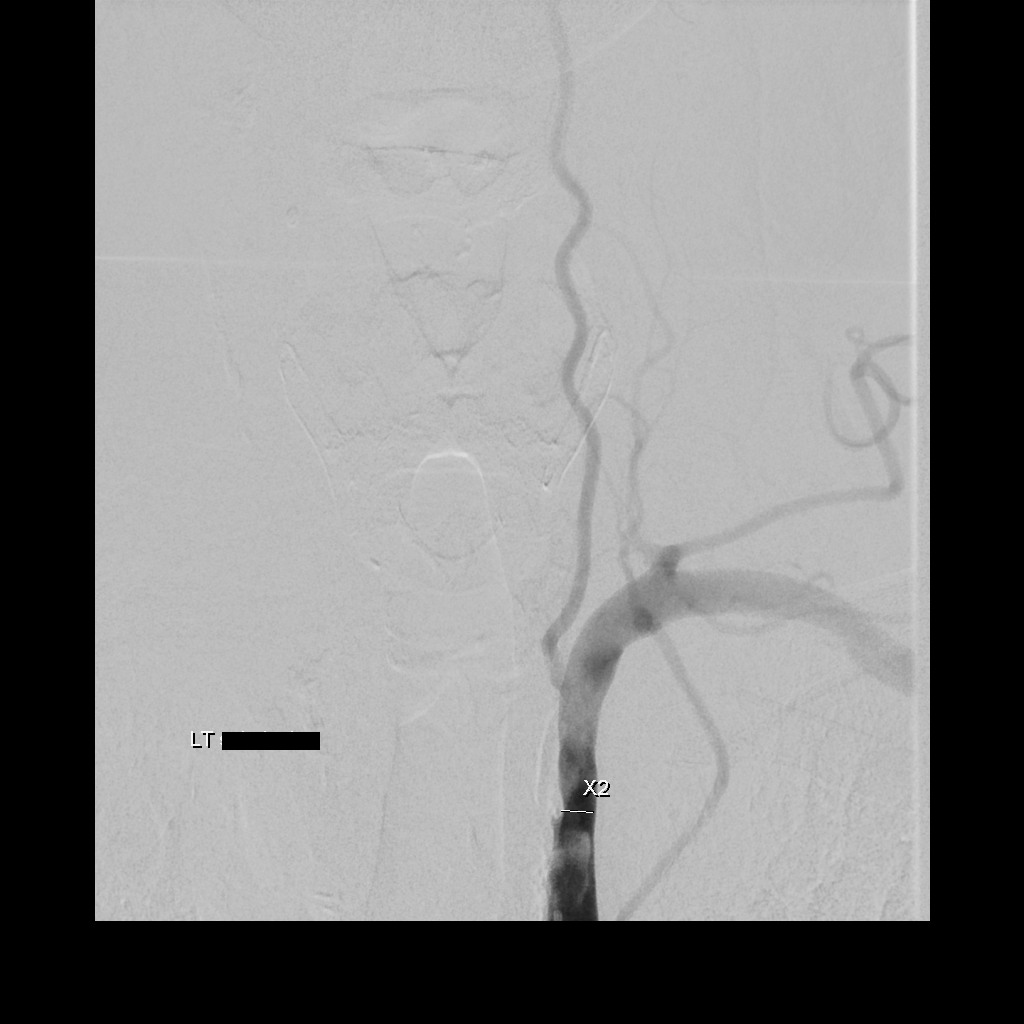

[3 of 3 positions shown; findings below may reference images not displayed]

MEDICATIONS:
Heparin [C3] units IV; no antibiotic was administered within 1 hour
of the procedure.

ANESTHESIA/SEDATION:
Versed 1 mg IV; Fentanyl 50 mcg IV.

Moderate Sedation Time:  40 minutes.

The patient was continuously monitored during the procedure by the
interventional radiology nurse under my direct supervision.

CONTRAST:  Isovue 300 proximal 80 cc.

FLUOROSCOPY TIME:  Fluoroscopy Time: 12 minutes 54 seconds ([C3]
mGy).

COMPLICATIONS:
None immediate.
The right groin was prepped and draped in the usual sterile fashion.
Thereafter using modified Seldinger technique, transfemoral access
into the right common femoral artery was obtained without
difficulty. Over a 0.035 inch guidewire, a 5 French Pinnacle sheath
was inserted. Through this, and also over 0.035 inch guidewire, a 5
French JB 1 catheter was advanced to the aortic arch region and
selectively positioned in the right common carotid artery, , the
right vertebral artery, the left common carotid artery and the left
vertebral artery.
FINDINGS: The right common carotid arteriogram demonstrates mild
atherosclerotic disease involving the right common carotid artery
proximal to the bifurcation.

The right external carotid artery also demonstrates mild stenosis.
Its branches,however,opacify normally.

The right internal carotid artery at the bulb has a smooth shallow
plaque along the medial and the posterior with resulting
approximately 40% stenosis by the NASCET criteria.

No acute ulcerations are seen. The vessel is, otherwise, seen to
opacify normally to the cranial skull base.

The petrous segment is widely patent.

There is mild atherosclerotic irregularity involving the petrous
cavernous junction, with an associated stenosis of approximately
30%. Distal to this the caval cavernous, the distal cavernous
segments are widely patent.

The right internal carotid artery terminus demonstrates
approximately 40% stenosis.

The right middle cerebral artery and the right anterior cerebral
artery opacify into the capillary and venous phases. The right
vertebral artery origin demonstrates approximately 30% stenosis at
its origin.

Distal to this, the vessel is seen to opacify to the cranial skull
base.

There is a 80-90% focal stenosis of the right vertebrobasilar
junction secondary to a smooth circumferential atherosclerotic
plaque.

Distal to this the right vertebrobasilar junction appears widely
patent.

The basilar artery, the posterior cerebral artery, the superior
cerebellar arteries and the anterior-inferior cerebellar arteries
opacify into the capillary and venous phases.

Mild atherosclerotic narrowing of the distal basilar artery is also
appreciated.

The left common carotid arteriogram demonstrates the left external
carotid artery and its major branches to be widely patent.

The left internal carotid artery at the bulb to the cranial skull
base opacifies normally.

The petrous cavernous junction demonstrates a mild 30% stenosis.
Distal to this, the cavernous and the supraclinoid segments are
widely patent.

A left posterior communicating artery is seen opacifying the left
posterior cerebral artery distribution.

The left middle cerebral artery and the left anterior cerebral
artery opacify into the capillary and venous phases.

There is an approximately 50% stenosis of the superior division of
the left middle cerebral artery at its origin.

Distal to this, the vessel is seen to opacify into the capillary and
venous phases.

The left subclavian arteriogram demonstrates approximately 50%
stenosis of the proximal left subclavian artery.

The origin of the nondominant left vertebral artery demonstrates
mild tortuosity.

However, there is free antegrade flow of contrast to the cranial
skull base.

Patency of the hypoplastic left vertebrobasilar junction proximal
and distal to the left posterior inferior cerebellar artery is
noted.

Partial opacification is seen of the distal basilar artery, the
posterior cerebral arteries, the superior cerebellar arteries and
flash filling of the anterior-inferior cerebellar arteries into the
capillary and venous phases.

Non-opacified blood is seen in the basilar artery from the
contralateral vertebral artery.
IMPRESSION: 1. 89% stenosis of the right vertebrobasilar junction secondary to a
smooth circumferential atherosclerotic plaque.

2. Approximately 30% stenosis of the right vertebral artery at its
origin.

3. Approximately 40% stenosis of the right internal carotid artery
at the bulb by the NASCET criteria.

4. Approximately 50% stenosis of the proximal left subclavian
artery.

5. Approximately 50% stenosis of the origin of the superior division
of the left middle cerebral artery.

PLAN:
Outpatient consultation regarding the management of the above
angiographic findings in 1-2 weeks.

## 2017-08-04 MED ORDER — IOPAMIDOL (ISOVUE-300) INJECTION 61%
INTRAVENOUS | Status: AC
  Administered 2017-08-04: 25 mL
  Filled 2017-08-04: qty 150

## 2017-08-04 MED ORDER — IOPAMIDOL (ISOVUE-300) INJECTION 61%
INTRAVENOUS | Status: AC
  Administered 2017-08-04: 25 mL
  Filled 2017-08-04: qty 50

## 2017-08-04 MED ORDER — HYDRALAZINE HCL 20 MG/ML IJ SOLN
INTRAMUSCULAR | Status: AC | PRN
  Administered 2017-08-04 (×3): 5 mg via INTRAVENOUS

## 2017-08-04 MED ORDER — HYDRALAZINE HCL 20 MG/ML IJ SOLN
INTRAMUSCULAR | Status: AC
  Filled 2017-08-04: qty 1

## 2017-08-04 MED ORDER — HEPARIN SODIUM (PORCINE) 1000 UNIT/ML IJ SOLN
INTRAMUSCULAR | Status: AC | PRN
  Administered 2017-08-04: 1000 [IU] via INTRAVENOUS

## 2017-08-04 MED ORDER — SODIUM CHLORIDE 0.9 % IV SOLN
Freq: Once | INTRAVENOUS | Status: AC
  Administered 2017-08-04: 09:00:00 via INTRAVENOUS

## 2017-08-04 MED ORDER — SODIUM CHLORIDE 0.9 % IV SOLN
INTRAVENOUS | Status: DC
  Administered 2017-08-04: 08:00:00 via INTRAVENOUS

## 2017-08-04 MED ORDER — MIDAZOLAM HCL 2 MG/2ML IJ SOLN
INTRAMUSCULAR | Status: AC
  Filled 2017-08-04: qty 2

## 2017-08-04 MED ORDER — FENTANYL CITRATE (PF) 100 MCG/2ML IJ SOLN
INTRAMUSCULAR | Status: AC | PRN
  Administered 2017-08-04 (×2): 25 ug via INTRAVENOUS

## 2017-08-04 MED ORDER — MIDAZOLAM HCL 2 MG/2ML IJ SOLN
INTRAMUSCULAR | Status: AC | PRN
  Administered 2017-08-04: 1 mg via INTRAVENOUS

## 2017-08-04 MED ORDER — HEPARIN SODIUM (PORCINE) 1000 UNIT/ML IJ SOLN
INTRAMUSCULAR | Status: AC
  Filled 2017-08-04: qty 1

## 2017-08-04 MED ORDER — FENTANYL CITRATE (PF) 100 MCG/2ML IJ SOLN
INTRAMUSCULAR | Status: AC
  Filled 2017-08-04: qty 2

## 2017-08-04 MED ORDER — LIDOCAINE HCL 1 % IJ SOLN
INTRAMUSCULAR | Status: AC | PRN
  Administered 2017-08-04: 10 mL

## 2017-08-04 MED ORDER — IOPAMIDOL (ISOVUE-300) INJECTION 61%
INTRAVENOUS | Status: AC
  Administered 2017-08-04: 10 mL
  Filled 2017-08-04: qty 50

## 2017-08-04 MED ORDER — IOPAMIDOL (ISOVUE-300) INJECTION 61%
INTRAVENOUS | Status: AC
  Administered 2017-08-04: 40 mL
  Filled 2017-08-04: qty 50

## 2017-08-04 MED ORDER — LIDOCAINE HCL 1 % IJ SOLN
INTRAMUSCULAR | Status: AC
  Filled 2017-08-04: qty 20

## 2017-08-04 MED ORDER — SODIUM CHLORIDE 0.9 % IV SOLN: INTRAVENOUS | Status: AC

## 2017-08-04 MED ORDER — SODIUM CHLORIDE 0.9 % IV SOLN
INTRAVENOUS | Status: DC
  Administered 2017-08-04: 10:00:00 via INTRAVENOUS

## 2017-08-04 NOTE — Sedation Documentation (Signed)
Patient is resting comfortably. 

## 2017-08-04 NOTE — Procedures (Signed)
S/P 4 vessel cerebral arteriogram. RT CFA approach. Findings. 1.approx  80 to 90% stenosis of dominant  RT VBJ prox. 2.Approx 50 % stenosis of superior division of LT MCA at its origin.

## 2017-08-04 NOTE — H&P (Signed)
Chief Complaint: Patient was seen in consultation today for vertebrobasilar insufficiency  Referring Physician(s): Dr. Adele Barthel  Supervising Physician: Luanne Bras  Patient Status: [facility] - Out-pt  History of Present Illness: Scott [name] is a 83 y.o. male with past medical history of HLD, HTN, CKD which recent finding of posterior circulation stroke consistent with VA disease.  Patient was referred to Dr. Estanislado Pandy for further evaluation and met in consultation 08/01/17.  After discussion, patient elects to proceed with diagnostic cerebral angiogram.   He presents for procedure today in his usual state of health.  He does not take blood thinners.  He has been NPO.   Past Medical History:  Diagnosis Date  . Arrhythmia   . Chronic kidney disease   . Hyperlipidemia   . Hypertension   . Hypothyroidism     Past Surgical History:  Procedure Laterality Date  . CATARACT EXTRACTION    . COLONOSCOPY    . IR RADIOLOGIST EVAL & MGMT  07/28/2017  . KNEE SURGERY      Allergies: Tramadol  Medications: Prior to Admission medications   Medication Sig Start Date End Date Taking? Authorizing Provider  acetaminophen (TYLENOL) 500 MG tablet Take 500 mg by mouth every 6 (six) hours as needed for mild pain or moderate pain.    Yes [provider]  aspirin EC 81 MG tablet Take 81 mg by mouth daily.   Yes [provider]  atorvastatin (LIPITOR) 10 MG tablet Take 10 mg by mouth every evening.    Yes [provider]  clopidogrel (PLAVIX) 75 MG tablet Take 75 mg by mouth daily.   Yes [provider]  doxazosin (CARDURA) 8 MG tablet Take 8 mg by mouth at bedtime.   Yes [provider]  fluticasone (FLONASE) 50 MCG/ACT nasal spray Place 1 spray into both nostrils daily as needed for allergies or rhinitis.   Yes [provider]  labetalol (NORMODYNE) 200 MG tablet Take 200 mg by mouth 2 (two) times daily.   Yes [provider]  levothyroxine (SYNTHROID, LEVOTHROID) 137 MCG tablet Take 137 mcg by mouth daily before breakfast.   Yes [provider]  lisinopril (PRINIVIL,ZESTRIL) 40 MG tablet Take 40 mg by mouth daily.   Yes [provider]  omeprazole (PRILOSEC) 20 MG capsule Take 20 mg by mouth daily.   Yes [provider]  OVER THE COUNTER MEDICATION Place 15 drops under the tongue 2 (two) times daily as needed. MPV 100 - Hemp   Yes [provider]  OVER THE COUNTER MEDICATION Take 5 mLs by mouth daily. Mix with 4-6 ounces of water daily - SUPER BEET   Yes [provider]  oxyCODONE-acetaminophen (PERCOCET/ROXICET) 5-325 MG tablet Take 1 tablet by mouth every 4 (four) hours as needed for severe pain.   Yes [provider]     Family History  Problem Relation Age of Onset  . Coronary artery disease Mother     Social History   Social History  . Marital status: Married    Spouse name: N/A  . Number of children: N/A  . Years of education: N/A   Social History Main Topics  . Smoking status: Former Smoker    Start date: 10/17/1965    Quit date: 10/17/1978  . Smokeless tobacco: Never Used  . Alcohol use No  . Drug use: No  . Sexual activity: Not on file   Other Topics Concern  . Not on file  Social History Narrative  . No narrative on file   Review of Systems  Constitutional: Negative for fatigue and fever.  Respiratory: Negative for cough and shortness of breath.   Cardiovascular: Negative for chest pain.  Gastrointestinal: Negative for abdominal pain.  Musculoskeletal: Positive for gait problem.  Neurological: Positive for weakness and headaches.  Psychiatric/Behavioral: Negative for behavioral problems and confusion.    Vital Signs: BP (!) 177/98 (BP Location: Right Arm)   Pulse 74   Temp 97.7 F (36.5 C) (Oral)   Ht _0  (1.676 m)   Wt 190 lb (86.2 kg)   SpO2 99%   BMI 30.67 kg/m   Physical Exam  Constitutional: He is  oriented to person, place, and time. He appears well-developed.  Cardiovascular: Normal rate, regular rhythm and normal heart sounds.   Pulmonary/Chest: Effort normal and breath sounds normal. No respiratory distress.  Abdominal: Soft.  Neurological: He is alert and oriented to person, place, and time.  No focal deficits.  Stable exam. No current headache or blurry vision.   Skin: Skin is warm and dry.  Psychiatric: He has a normal mood and affect. His behavior is normal. Judgment and thought content normal.  Nursing note and vitals reviewed.   Imaging: Ir Radiologist Eval & Mgmt  Result Date: 08/01/2017 EXAM: NEW PATIENT OFFICE VISIT CHIEF COMPLAINT: Vertebrobasilar insufficiency.  Dizziness.  Wobbly gait. Current Pain Level: 1-10 HISTORY OF PRESENT ILLNESS: The patient is an 83 year old right-handed gentleman who is referred for evaluation of symptoms of dizziness and gait unsteadiness progressively worsening over the past few months. The patient is accompanied by his wife. History was obtained from the patient and also from the wife. Patient has had a prior history of dizziness and gait instability. These have been increasing in frequency. He reports symptoms of room spinning and gait instability. The episodes last 30 to 40 seconds. During these times, the patient reports no associated nausea or vomiting, visual aberrations such as blindness, blurred vision or diplopia, or of loss of consciousness or seizure-like activity. He denies any motor weakness, or sensory symptoms. These symptoms were initially proceeded by symptoms of headache a few months ago. Subsequent workup which included MRI of the brain and MRA of the brain revealed two micro infarcs in the posterior fossa, one in the cerebellum and one in the right posterolateral proximal caudal pons. The MRA also depicted possible high-grade stenosis of the right internal carotid artery at the bulb and a possible signal drop-off in the cervical  petrous junction on the right side. However, this may be an artifact. Similarly severe stenosis was noted of the dominant right vertebral artery at the distal right vertebral artery at the level of C1-C2. The left vertebral artery is hypoplastic. The basilar artery appears widely patent. Past medical history: Arrhythmias. Chronic kidney disease. Hyperlipidemia. Hypertension. Hypothyroidism. Past surgical history of cataract extraction. Hip surgery on the left side about 3 years ago. Has had three previous back surgeries. Colonoscopy. Present medications: Tylenol, aspirin 81 mg a day, Lipitor, Cardura, labetalol, levothyroxine, lisinopril, omeprazole, oxycodone acetaminophen as needed. Allergies: Tramadol - one time episode of nausea and epigastric upset. Social History: Two children. One daughter has diabetes mellitus. Eleven siblings. Denies smoking cigarettes, or drinking alcohol or using illicit chemicals. Family History: Significant for MI in the mother, one brother died of a brain hemorrhage, one brother died of lung carcinoma, another brother died of unknown cause. REVIEW OF SYSTEMS: Essentially negative for pathologic symptomatology except as mentioned above. No symptoms  of recent chills, fever or rigors. Appetite normal.  Weight is steady. Patient fully independent with a cane prior to the episodes of vertebrobasilar ischemia. Presently undergoing physical therapy with a walker improving. Otherwise, able to perform all daily chores without any help. PHYSICAL EXAMINATION: Appears in no acute distress. Affect appropriate to the situation. Neurologically no lateralizing cranial nerve, motor, sensory or coordination difficulties. Station and gait - patient walks with a slightly antalgic gait using walker. ASSESSMENT AND PLAN: Recent MRI MRA of the brain - the images were reviewed with him and his wife. Brought to their attention were the two focal areas of diffusion weighted restriction, one in the right  posterolateral pons caudally, and one in the left cerebral hemisphere. Previous changes supratentorially probably related to microvascular chronic ischemia and also lacunar infarct in the left thalamus. Also brought to their attention was the area of stenosis of the right internal carotid artery, and also of the dominant right vertebral artery and its distal right vertebral artery in the cervical region, of 80-90%. The natural history of intracranial and extracranial atherosclerotic disease of was reviewed in detail. With the patient already having had previous areas of ischemia involving the thalamus, and also recently of two areas, one involving the right sided caudal pons and one in the left cerebral hemisphere, there was an increased risk of a subsequent more catastrophic ischemic event in the next 8-12 weeks. The option of further evaluation with a diagnostic catheter arteriogram to more accurately evaluate the suspected areas of abnormality was reviewed with him and his wife. The patient and the wife were informed that should the patient contemplate endovascular revascularization should a bonafide lesion be found, it would be appropriate to proceed with a diagnostic catheter arteriogram. The patient and his spouse expressed the view of wanting to prevent further more catastrophic ischemic events, should a treatable lesion be found. The diagnostic catheter arteriogram procedure was discussed in detail. The risks, the benefits were all reviewed. The risk of ischemic stroke of less than 1% was discussed. Questions were answered to their satisfaction. The patient and the spouse would like proceed with a diagnostic catheter arteriogram prior to finalizing if appropriate endovascular revascularization of the right vertebral artery in the distal cervical segment,. They are in agreement with the above management plan. They both leave with good understanding and agreement with the above management plan. The diagnostic  catheter arteriogram will be scheduled as soon as possible. In the meantime, the patient has been advised to start Plavix 75 mg a day in addition to the aspirin 81 mg a day that he already is taking. Electronically Signed   By: Luanne Bras M.D.   On: 07/29/2017 09:39    Labs:  CBC:  Recent Labs  08/04/17 0736  WBC 6.0  HGB 12.0*  HCT 36.9*  PLT 199    COAGS:  Recent Labs  08/04/17 0736  INR 1.06    BMP:  Recent Labs  08/04/17 0736  NA 136  K 4.3  CL 102  CO2 26  GLUCOSE 92  BUN 17  CALCIUM 8.5*  CREATININE 1.46*  GFRNONAA 42*  GFRAA 48*    LIVER FUNCTION TESTS: No results for input(s): BILITOT, AST, ALT, ALKPHOS, PROT, ALBUMIN in the last 8760 hours.  TUMOR MARKERS: No results for input(s): AFPTM, CEA, CA199, CHROMGRNA in the last 8760 hours.  Assessment and Plan: Patient with past medical history of dizziness and unsteady gait, recent posterior circulation strokes presents for diagnostic cerebral angiogram today  with Dr. Estanislado Pandy. Patient presents today in their usual state of health.  He has been NPO and is not currently on blood thinners.  His BP is elevated this AM, but did take his blood pressure medication this AM. Risks and benefits of cerebral angiogram were discussed with the patient including, but not limited to bleeding, infection, vascular injury or contrast induced renal failure.  This interventional procedure involves the use of X-rays and because of the nature of the planned procedure, it is possible that we will have prolonged use of X-ray fluoroscopy.  Potential radiation risks to you include (but are not limited to) the following: - A slightly elevated risk for cancer  several years later in life. This risk is typically less than 0.5% percent. This risk is low in comparison to the normal incidence of human cancer, which is 33% for women and 50% for men according to the [place]. - Radiation induced injury can  include skin redness, resembling a rash, tissue breakdown / ulcers and hair loss (which can be temporary or permanent).   The likelihood of either of these occurring depends on the difficulty of the procedure and whether you are sensitive to radiation due to previous procedures, disease, or genetic conditions.   IF your procedure requires a prolonged use of radiation, you will be notified and given written instructions for further action.  It is your responsibility to monitor the irradiated area for the 2 weeks following the procedure and to notify your physician if you are concerned that you have suffered a radiation induced injury.    All of the patient's questions were answered, patient is agreeable to proceed.  Consent signed and in chart.   Thank you for this interesting consult.  I greatly enjoyed meeting Scott [name] and look forward to participating in their care.  A copy of this report was sent to the requesting provider on this date.  Electronically Signed: Docia Barrier, PA 08/04/2017, 9:40 AM   I spent a total of    15 Minutes in face to face in clinical consultation, greater than 50% of which was counseling/coordinating care for vertebrobasilar insufficiency, dizziness.

## 2017-08-04 NOTE — Sedation Documentation (Signed)
Pressure hold continues to R groin

## 2017-08-04 NOTE — Sedation Documentation (Signed)
Pressure hold complete 1115. Bedrest 4 hours- HOB elevated 30 degrees at 1400

## 2017-08-04 NOTE — Discharge Instructions (Signed)
Cerebral Angiogram, Care After °Refer to this sheet in the next few weeks. These instructions provide you with information on caring for yourself after your procedure. Your health care provider may also give you more specific instructions. Your treatment has been planned according to current medical practices, but problems sometimes occur. Call your health care provider if you have any problems or questions after your procedure. °What can I expect after the procedure? °After your procedure, it is typical to have the following: °· Bruising at the catheter insertion site that usually fades within 1-2 weeks. °· Blood collecting in the tissue (hematoma) that may be painful to the touch. It should usually decrease in size and tenderness within 1-2 weeks. °· A mild headache. ° °Follow these instructions at home: °· Take medicines only as directed by your health care provider. °· You may shower 24-48 hours after the procedure or as directed by your health care provider. Remove the bandage (dressing) and gently wash the site with plain soap and water. Pat the area dry with a clean towel. Do not rub the site, because this may cause bleeding. °· Do not take baths, swim, or use a hot tub until your health care provider approves. °· Check your insertion site every day for redness, swelling, or drainage. °· Do not apply powder or lotion to the site. °· Do not lift over 10 lb (4.5 kg) for 5 days after your procedure or as directed by your health care provider. °· Ask your health care provider when it is okay to: °? Return to work or school. °? Resume usual physical activities or sports. °? Resume sexual activity. °· Do not drive home if you are discharged the same day as the procedure. Have someone else drive you. °· You may drive 24 hours after the procedure unless otherwise instructed by your health care provider. °· Do not operate machinery or power tools for 24 hours after the procedure or as directed by your health care  provider. °· If your procedure was done as an outpatient procedure, which means that you went home the same day as your procedure, a responsible adult should be with you for the first 24 hours after you arrive home. °· Keep all follow-up visits as directed by your health care provider. This is important. °Contact a health care provider if: °· You have a fever. °· You have chills. °· You have increased bleeding from the catheter insertion site. Hold pressure on the site. °Get help right away if: °· You have vision changes or loss of vision. °· You have numbness or weakness on one side of your body. °· You have difficulty talking, or you have slurred speech or cannot speak (aphasia). °· You feel confused or have difficulty remembering. °· You have unusual pain at the catheter insertion site. °· You have redness, warmth, or swelling at the catheter insertion site. °· You have drainage (other than a small amount of blood on the dressing) from the catheter insertion site. °· The catheter insertion site is bleeding, and the bleeding does not stop after 30 minutes of holding steady pressure on the site. °These symptoms may represent a serious problem that is an emergency. Do not wait to see if the symptoms will go away. Get medical help right away. Call your local emergency services (911 in [place] Do not drive yourself to the hospital. °This information is not intended to replace advice given to you by your health care provider. Make sure you discuss any questions   you have with your health care provider. °Document Released: 02/17/2014 Document Revised: 03/10/2016 Document Reviewed: 10/16/2013 °Elsevier Interactive Patient Education © 2017 [employer] ° °

## 2017-08-04 NOTE — Sedation Documentation (Signed)
IR tech holding pressure to R groin 

## 2017-08-04 NOTE — Sedation Documentation (Signed)
IR continues holding pressure

## 2017-08-07 ENCOUNTER — Other Ambulatory Visit (HOSPITAL_COMMUNITY): Payer: Self-pay | Admitting: Interventional Radiology

## 2017-08-07 DIAGNOSIS — I771 Stricture of artery: Secondary | ICD-10-CM

## 2017-08-09 DIAGNOSIS — I69351 Hemiplegia and hemiparesis following cerebral infarction affecting right dominant side: Secondary | ICD-10-CM | POA: Diagnosis not present

## 2017-08-09 DIAGNOSIS — K219 Gastro-esophageal reflux disease without esophagitis: Secondary | ICD-10-CM | POA: Diagnosis not present

## 2017-08-09 DIAGNOSIS — E039 Hypothyroidism, unspecified: Secondary | ICD-10-CM | POA: Diagnosis not present

## 2017-08-09 DIAGNOSIS — N182 Chronic kidney disease, stage 2 (mild): Secondary | ICD-10-CM | POA: Diagnosis not present

## 2017-08-09 DIAGNOSIS — M19041 Primary osteoarthritis, right hand: Secondary | ICD-10-CM | POA: Diagnosis not present

## 2017-08-09 DIAGNOSIS — I129 Hypertensive chronic kidney disease with stage 1 through stage 4 chronic kidney disease, or unspecified chronic kidney disease: Secondary | ICD-10-CM | POA: Diagnosis not present

## 2017-08-21 ENCOUNTER — Encounter (HOSPITAL_COMMUNITY): Payer: Self-pay | Admitting: Interventional Radiology

## 2017-08-22 ENCOUNTER — Ambulatory Visit (HOSPITAL_COMMUNITY)
Admission: RE | Admit: 2017-08-22 | Discharge: 2017-08-22 | Disposition: A | Discharge status: Home / Self Care | Payer: Medicare Other | Source: Ambulatory Visit | Attending: Interventional Radiology | Admitting: Interventional Radiology

## 2017-08-22 DIAGNOSIS — G450 Vertebro-basilar artery syndrome: Secondary | ICD-10-CM | POA: Diagnosis not present

## 2017-08-22 DIAGNOSIS — I771 Stricture of artery: Secondary | ICD-10-CM

## 2017-08-22 DIAGNOSIS — L7621 Postprocedural hemorrhage and hematoma of skin and subcutaneous tissue following a dermatologic procedure: Secondary | ICD-10-CM | POA: Diagnosis not present

## 2017-08-22 HISTORY — PX: IR RADIOLOGIST EVAL & MGMT: IMG5224

## 2017-08-23 ENCOUNTER — Encounter (HOSPITAL_COMMUNITY): Payer: Self-pay | Admitting: Interventional Radiology

## 2017-08-23 NOTE — Progress Notes (Signed)
Established Carotid Patient   History of Present Illness   Scott [name] is a 83 y.o. ([birth year]) male who presents with chief complaint: dizziness.  Pt was sent to Dr. Estanislado Pandy for carotid and vertebral angiography and possible intervention.  These studies severe right vertebrobasilar stenosis in the dominant vertebral artery  Previous carotid studies demonstrated: RICA 17-00% stenosis, LICA minimal stenosis with bilateral vertebral artery flow.  The MRA replicated the R ICA stenosis and demonstrated a R VA stenosis in this dominant VA.    Patient has prior history of dizziness and gait instability.  The patient has never had amaurosis fugax or monocular blindness.  The patient has never had facial drooping or hemiplegia.  The patient has never had receptive or expressive aphasia.   The patient's previous neurologic deficits have resolved.    The patient's PMH, PSH, SH, and FamHx are unchanged from 07/21/17.  Current Outpatient Medications  Medication Sig Dispense Refill  . acetaminophen (TYLENOL) 500 MG tablet Take 500 mg by mouth every 6 (six) hours as needed for mild pain or moderate pain.     Marland Kitchen aspirin EC 81 MG tablet Take 81 mg by mouth daily.    Marland Kitchen atorvastatin (LIPITOR) 10 MG tablet Take 10 mg by mouth every evening.     . clopidogrel (PLAVIX) 75 MG tablet Take 75 mg by mouth daily.    Marland Kitchen doxazosin (CARDURA) 8 MG tablet Take 8 mg by mouth at bedtime.    . fluticasone (FLONASE) 50 MCG/ACT nasal spray Place 1 spray into both nostrils daily as needed for allergies or rhinitis.    Marland Kitchen labetalol (NORMODYNE) 200 MG tablet Take 200 mg by mouth 2 (two) times daily.    Marland Kitchen levothyroxine (SYNTHROID, LEVOTHROID) 137 MCG tablet Take 137 mcg by mouth daily before breakfast.    . lisinopril (PRINIVIL,ZESTRIL) 40 MG tablet Take 40 mg by mouth daily.    Marland Kitchen omeprazole (PRILOSEC) 20 MG capsule Take 20 mg by mouth daily.    Marland Kitchen OVER THE COUNTER MEDICATION Place 15 drops under the tongue 2 (two)  times daily as needed. MPV 100 - Hemp    . OVER THE COUNTER MEDICATION Take 5 mLs by mouth daily. Mix with 4-6 ounces of water daily - SUPER BEET    . oxyCODONE-acetaminophen (PERCOCET/ROXICET) 5-325 MG tablet Take 1 tablet by mouth every 4 (four) hours as needed for severe pain.     No current facility-administered medications for this visit.     On ROS today: no recent CVA, no intermittent claudication    Physical Examination   Vitals:   08/25/17 0934 08/25/17 0939  BP: (!) 157/87 (!) 156/85  Pulse: 76   Resp: 20   Temp: 98.1 F (36.7 C)   TempSrc: Oral   SpO2: 99%   Weight: 190 lb (86.2 kg)   Height: 5\' 6"  (1.676 m)    Body mass index is 30.67 kg/m.  General Alert, O x 3, WD, NAD  Neck Supple, mid-line trachea,    Pulmonary Sym exp, good B air movt, CTA B  Cardiac RRR, Nl S1, S2, no Murmurs, No rubs, No S3,S4  Vascular Vessel Right Left  Radial Palpable Palpable  Brachial Palpable Palpable  Carotid Palpable, No Bruit Palpable, No Bruit  Aorta Not palpable N/A  Femoral Palpable Palpable  Popliteal Not palpable Not palpable  PT Palpable Palpable  DP Palpable Palpable    Gastro- intestinal soft, non-distended, non-tender to palpation, No guarding or rebound, no HSM, no  masses, no CVAT B, No palpable prominent aortic pulse,    Musculo- skeletal M/S 5/5 throughout  , Extremities without ischemic changes, B varicosities evident  Neurologic Cranial nerves 2-12 intact , Pain and light touch intact in extremities , Motor exam as listed above     Radiology     B carotid, vertebral and cerebral angiography (08/04/17): 1. 89% stenosis of the right vertebrobasilar junction secondary to a smooth circumferential atherosclerotic plaque.  2. Approximately 30% stenosis of the right vertebral artery at its origin.  3. Approximately 40% stenosis of the right internal carotid artery at the bulb by the NASCET criteria.  4. Approximately 50% stenosis of the proximal left  subclavian artery.  5. Approximately 50% stenosis of the origin of the superior division of the left middle cerebral artery.   I reviewed this patient's carotid and vertebral angiogram and agree with the limit carotid stenosis, which do not account for this patient's sx.  I agree the severe right vertebrobasilar disease likely accounts for his sx.   Medical Decision Making   Scott [name] is a 83 y.o. male who presents with: sx R VA severe stenosis, B CVA, asx B ICA stenosis <50%   The carotid angiography fails to demonstrate a significant lesion that needs intervention, rather the vertebral angiography demonstrates significant R side VA disease which might account for his sx.  I do not do vertebral artery interventions, so I will defer to Dr. Estanislado Pandy any endovascular intervention.  If he feels he does not have anything to offer, I will defer this patient to one of the surrounding tertiary vascular surgical programs, given the lack of local expertise locally.  Appears patient has elected to proceed with Dr. Estanislado Pandy.  In regards to his carotid disease, I would continue surveillance annually with B carotid artery disease.  I discussed in depth with the patient the nature of atherosclerosis, and emphasized the importance of maximal medical management including strict control of blood pressure, blood glucose, and lipid levels, antiplatelet agents, obtaining regular exercise, and cessation of smoking.    The patient is aware that without maximal medical management the underlying atherosclerotic disease process will progress, limiting the benefit of any interventions. The patient is currently on a statin: Lipitor.  The patient is currently on an anti-platelet: ASA.  Thank you for allowing [place] to participate in this patient's care.   Adele Barthel, MD, FACS [facility] Office: [phone] Pager: [phone]

## 2017-08-25 ENCOUNTER — Ambulatory Visit (INDEPENDENT_AMBULATORY_CARE_PROVIDER_SITE_OTHER): Payer: Medicare Other | Admitting: Vascular Surgery

## 2017-08-25 ENCOUNTER — Encounter: Payer: Self-pay | Admitting: Cardiovascular Disease

## 2017-08-25 ENCOUNTER — Encounter: Payer: Self-pay | Admitting: Vascular Surgery

## 2017-08-25 ENCOUNTER — Ambulatory Visit (INDEPENDENT_AMBULATORY_CARE_PROVIDER_SITE_OTHER): Payer: Medicare Other | Admitting: Cardiovascular Disease

## 2017-08-25 ENCOUNTER — Other Ambulatory Visit (HOSPITAL_COMMUNITY): Payer: Self-pay | Admitting: Interventional Radiology

## 2017-08-25 VITALS — BP 148/82 | HR 74 | Ht 66.0 in | Wt 190.0 lb

## 2017-08-25 VITALS — BP 156/85 | HR 76 | Temp 98.1°F | Resp 20 | Ht 66.0 in | Wt 190.0 lb

## 2017-08-25 DIAGNOSIS — I6523 Occlusion and stenosis of bilateral carotid arteries: Secondary | ICD-10-CM | POA: Diagnosis not present

## 2017-08-25 DIAGNOSIS — I493 Ventricular premature depolarization: Secondary | ICD-10-CM | POA: Diagnosis not present

## 2017-08-25 DIAGNOSIS — I6521 Occlusion and stenosis of right carotid artery: Secondary | ICD-10-CM

## 2017-08-25 DIAGNOSIS — Z01818 Encounter for other preprocedural examination: Secondary | ICD-10-CM

## 2017-08-25 DIAGNOSIS — I10 Essential (primary) hypertension: Secondary | ICD-10-CM

## 2017-08-25 DIAGNOSIS — I779 Disorder of arteries and arterioles, unspecified: Secondary | ICD-10-CM

## 2017-08-25 DIAGNOSIS — I739 Peripheral vascular disease, unspecified: Secondary | ICD-10-CM

## 2017-08-25 NOTE — Patient Instructions (Signed)
Your physician recommends that you schedule a follow-up appointment in: AS NEEDED WITH DR KONESWARAN  Your physician recommends that you continue on your current medications as directed. Please refer to the Current Medication list given to you today.  Thank you for choosing  [facility]!!    

## 2017-08-25 NOTE — Progress Notes (Signed)
SUBJECTIVE: The patient is here to followup for premature atrial and ventricular contractions. Echocardiography in November 2014 demonstrated normal left ventricular systolic function, EF 18-56%, with grade 1 diastolic dysfunction and PA pressures of 42 mm mercury.  He has 89% stenosis of the right vertebrobasilar junction.    He is being scheduled to undergo endovascular revascularization for the aforementioned lesion tentatively for 09/05/17.  He underwent a normal nuclear stress test on 08/05/15.  The patient denies any symptoms of chest pain, palpitations, shortness of breath, leg swelling, orthopnea, PND, and syncope.  ECG performed in the office today which I ordered and personally interpreted demonstrates normal sinus rhythm with no ischemic ST segment or T-wave abnormalities, nor any arrhythmias.     Review of Systems: As per "subjective", otherwise negative.  Allergies  Allergen Reactions  . Tramadol Nausea Only    Current Outpatient Medications  Medication Sig Dispense Refill  . acetaminophen (TYLENOL) 500 MG tablet Take 500 mg by mouth every 6 (six) hours as needed for mild pain or moderate pain.     Marland Kitchen aspirin EC 81 MG tablet Take 81 mg by mouth daily.    Marland Kitchen atorvastatin (LIPITOR) 10 MG tablet Take 10 mg by mouth every evening.     . clopidogrel (PLAVIX) 75 MG tablet Take 75 mg by mouth daily.    Marland Kitchen doxazosin (CARDURA) 8 MG tablet Take 8 mg by mouth at bedtime.    . fluticasone (FLONASE) 50 MCG/ACT nasal spray Place 1 spray into both nostrils daily as needed for allergies or rhinitis.    Marland Kitchen labetalol (NORMODYNE) 200 MG tablet Take 200 mg by mouth 2 (two) times daily.    Marland Kitchen levothyroxine (SYNTHROID, LEVOTHROID) 137 MCG tablet Take 137 mcg by mouth daily before breakfast.    . lisinopril (PRINIVIL,ZESTRIL) 40 MG tablet Take 40 mg by mouth daily.    Marland Kitchen omeprazole (PRILOSEC) 20 MG capsule Take 20 mg by mouth daily.    Marland Kitchen OVER THE COUNTER MEDICATION Place 15 drops under  the tongue 2 (two) times daily as needed. MPV 100 - Hemp    . OVER THE COUNTER MEDICATION Take 5 mLs by mouth daily. Mix with 4-6 ounces of water daily - SUPER BEET    . oxyCODONE-acetaminophen (PERCOCET/ROXICET) 5-325 MG tablet Take 1 tablet by mouth every 4 (four) hours as needed for severe pain.     No current facility-administered medications for this visit.     Past Medical History:  Diagnosis Date  . Arrhythmia   . Chronic kidney disease   . Hyperlipidemia   . Hypertension   . Hypothyroidism     Past Surgical History:  Procedure Laterality Date  . CATARACT EXTRACTION    . COLONOSCOPY    . IR ANGIO INTRA EXTRACRAN SEL COM CAROTID INNOMINATE BILAT MOD SED  08/04/2017  . IR ANGIO VERTEBRAL SEL VERTEBRAL BILAT MOD SED  08/04/2017  . IR RADIOLOGIST EVAL & MGMT  07/28/2017  . IR RADIOLOGIST EVAL & MGMT  08/22/2017  . KNEE SURGERY      Social History   Socioeconomic History  . Marital status: Married    Spouse name: Not on file  . Number of children: Not on file  . Years of education: Not on file  . Highest education level: Not on file  Social Needs  . Financial resource strain: Not on file  . Food insecurity - worry: Not on file  . Food insecurity - inability: Not on file  .  Transportation needs - medical: Not on file  . Transportation needs - non-medical: Not on file  Occupational History  . Not on file  Tobacco Use  . Smoking status: Former Smoker    Start date: 10/17/1965    Last attempt to quit: 10/17/1978    Years since quitting: 38.8  . Smokeless tobacco: Never Used  Substance and Sexual Activity  . Alcohol use: No    Alcohol/week: 0.0 oz  . Drug use: No  . Sexual activity: Not on file  Other Topics Concern  . Not on file  Social History Narrative  . Not on file     Vitals:   08/25/17 1254  BP: (!) 148/82  Pulse: 74  SpO2: 98%  Weight: 190 lb (86.2 kg)  Height: 5\' 6"  (1.676 m)    Wt Readings from Last 3 Encounters:  08/25/17 190 lb (86.2 kg)    08/25/17 190 lb (86.2 kg)  08/04/17 190 lb (86.2 kg)     PHYSICAL EXAM General: NAD HEENT: Normal. Neck: No JVD, no thyromegaly. Lungs: Clear to auscultation bilaterally with normal respiratory effort. CV: Regular rate and rhythm, normal S1/S2, no S3/S4, no murmur. No pretibial or periankle edema. Abdomen: Soft, nontender, no distention.  Neurologic: Alert and oriented.  Psych: Normal affect. Skin: Normal. Musculoskeletal: No gross deformities.    ECG: Most recent ECG reviewed.   Labs: Lab Results  Component Value Date/Time   K 4.3 08/04/2017 07:36 AM   BUN 17 08/04/2017 07:36 AM   CREATININE 1.46 (H) 08/04/2017 07:36 AM   ALT 16 09/22/2008 12:36 PM   HGB 12.0 (L) 08/04/2017 07:36 AM     Lipids: No results found for: LDLCALC, LDLDIRECT, CHOL, TRIG, HDL     ASSESSMENT AND PLAN:  1. PAC's/PVC's: Symptomatically stable on labetalol 200 mg bid. Normal LV systolic function in 3428.  Nuclear stress test was normal in 2016 as detailed above.  2. Essential HTN: Mildly elevated.  No changes to therapy.  3.  Preoperative risk stratification: Based upon available data, the patient has a 1.5% probability of perioperative myocardial infarction or cardiac arrest.  He is on a beta-blocker and statin which will help to attenuate this risk.  No cardiac testing is indicated at this time.   Disposition: Follow up prn   Kate Sable, M.D., F.A.C.C.

## 2017-08-29 ENCOUNTER — Other Ambulatory Visit (HOSPITAL_COMMUNITY): Payer: Self-pay | Admitting: Interventional Radiology

## 2017-08-29 ENCOUNTER — Telehealth (HOSPITAL_COMMUNITY): Payer: Self-pay

## 2017-08-29 DIAGNOSIS — T148XXA Other injury of unspecified body region, initial encounter: Secondary | ICD-10-CM

## 2017-08-29 NOTE — Telephone Encounter (Signed)
Called to schedule ultrasound, left message for pt to return call. AW

## 2017-08-30 ENCOUNTER — Telehealth (HOSPITAL_COMMUNITY): Payer: Self-pay

## 2017-08-30 NOTE — Telephone Encounter (Signed)
Called to schedule ultrasound of pt's groin looking for a hematoma. The pt stated that he doesn't want to schedule at this time because the swollen area is almost completely gone now. He can barely feel it. Just a tiny little area. I told the pt that I would inform Dr. Estanislado Pandy and we will move forward with scheduling his tx. AW

## 2017-08-31 NOTE — Addendum Note (Signed)
Addended by: Lianne Cure A on: 08/31/2017 02:51 PM   Modules accepted: Orders

## 2017-09-01 ENCOUNTER — Other Ambulatory Visit (HOSPITAL_COMMUNITY): Payer: Self-pay | Admitting: Interventional Radiology

## 2017-09-01 DIAGNOSIS — I771 Stricture of artery: Secondary | ICD-10-CM

## 2017-09-05 ENCOUNTER — Other Ambulatory Visit: Payer: Self-pay

## 2017-09-05 ENCOUNTER — Encounter (HOSPITAL_COMMUNITY): Payer: Self-pay | Admitting: *Deleted

## 2017-09-05 ENCOUNTER — Ambulatory Visit (HOSPITAL_COMMUNITY)
Admission: RE | Admit: 2017-09-05 | Discharge: 2017-09-05 | Disposition: A | Discharge status: Home / Self Care | Payer: Medicare Other | Source: Ambulatory Visit | Attending: Interventional Radiology | Admitting: Interventional Radiology

## 2017-09-05 DIAGNOSIS — G459 Transient cerebral ischemic attack, unspecified: Secondary | ICD-10-CM | POA: Diagnosis not present

## 2017-09-05 DIAGNOSIS — M19041 Primary osteoarthritis, right hand: Secondary | ICD-10-CM | POA: Diagnosis not present

## 2017-09-05 DIAGNOSIS — C61 Malignant neoplasm of prostate: Secondary | ICD-10-CM | POA: Diagnosis not present

## 2017-09-05 DIAGNOSIS — M1712 Unilateral primary osteoarthritis, left knee: Secondary | ICD-10-CM | POA: Diagnosis not present

## 2017-09-05 DIAGNOSIS — I10 Essential (primary) hypertension: Secondary | ICD-10-CM | POA: Diagnosis not present

## 2017-09-05 DIAGNOSIS — R1903 Right lower quadrant abdominal swelling, mass and lump: Secondary | ICD-10-CM | POA: Insufficient documentation

## 2017-09-05 DIAGNOSIS — T148XXA Other injury of unspecified body region, initial encounter: Secondary | ICD-10-CM

## 2017-09-05 DIAGNOSIS — N182 Chronic kidney disease, stage 2 (mild): Secondary | ICD-10-CM | POA: Diagnosis not present

## 2017-09-05 DIAGNOSIS — E039 Hypothyroidism, unspecified: Secondary | ICD-10-CM | POA: Diagnosis not present

## 2017-09-05 DIAGNOSIS — R1030 Lower abdominal pain, unspecified: Secondary | ICD-10-CM | POA: Diagnosis not present

## 2017-09-05 DIAGNOSIS — I679 Cerebrovascular disease, unspecified: Secondary | ICD-10-CM | POA: Diagnosis not present

## 2017-09-05 NOTE — Progress Notes (Signed)
Pt denies any acute cardiopulmonary issues. Pt under the care of Dr. Jacinta Shoe, Cardiology. Pt denies having a cardiac cath.Requsted recent labs from Dr. Pleas Koch at [facility]. Pt made aware to stop taking vitamins, fish oil, Hemp, Beet and herbal medications. Do not take any NSAIDs ie: Ibuprofen, Advil, Naproxen (Aleve), Motrin, BC and Goody Powder. Pt verbalized understanding of all pre-op instructions.

## 2017-09-06 ENCOUNTER — Encounter (HOSPITAL_COMMUNITY): Payer: Self-pay | Admitting: Emergency Medicine

## 2017-09-06 ENCOUNTER — Other Ambulatory Visit: Payer: Self-pay | Admitting: Radiology

## 2017-09-06 ENCOUNTER — Ambulatory Visit (INDEPENDENT_AMBULATORY_CARE_PROVIDER_SITE_OTHER): Payer: Medicare Other | Admitting: Urology

## 2017-09-06 DIAGNOSIS — R3915 Urgency of urination: Secondary | ICD-10-CM

## 2017-09-06 DIAGNOSIS — R351 Nocturia: Secondary | ICD-10-CM

## 2017-09-06 NOTE — Progress Notes (Signed)
Anesthesia Chart Review:  Pt is a same day work up  Pt is an 83 year old male scheduled for stent to R vertebrobasilar junction on 09/11/2017 with Luanne Bras, MD  - PCP is Judd Lien, MD - Cardiologist is Kate Sable, MD who cleared pt for surgery at last office visit 08/25/17.   PMH includes:  HTN, hyperlipidemia, CKD, arrhythmia (unspecified), hypothyroidism, TIA, DOE. Former smoker.  Medications include: ASA 81 mg, Lipitor, Plavix, doxazosin, labetalol, levothyroxine, lisinopril, Prilosec  Labs will be obtained day of surgery.   EKG 08/25/17: NSR  Carotid duplex 06/22/17:  1. R ICA 50-69% stenosis 2. L ICA without hemodynamically significant stenosis  Nuclear stress test 08/05/15:   There was no ST segment deviation noted during stress.  Normal myocardial perfusion with soft tissue attenuation artifact. No ischemia or scar.  This is a low risk study.  Nuclear stress EF: 64%.  Echo 09/06/17:  - Left ventricle: The cavity size was normal. Wall thicknesswas normal. Systolic function was normal. The estimatedejection fraction was in the range of 50% to 55%. Wallmotion was normal; there were no regional wall motionabnormalities. Doppler parameters are consistent withabnormal left ventricular relaxation (grade 1 diastolic dysfunction). - Aortic valve: Mildly calcified annulus. Trileaflet. No significant regurgitation. Mean gradient: 51m Hg (S). - Mitral valve: Calcified annulus. Trivial regurgitation. - Left atrium: The atrium was mildly dilated. - Right atrium: The atrium was at the upper limits of normalin size. Central venous pressure: 825mHg (est). - Atrial septum: There was increased thickness of theseptum, consistent with lipomatous hypertrophy. No defector patent foramen ovale was identified. - Tricuspid valve: Trivial regurgitation. - Pulmonary arteries: PA peak pressure: 4250mg (S). - Pericardium, extracardiac: There was no  pericardialeffusion. - Impressions: No prior study for comparison. Normal LV wall thickness with LVEF 50-21-30%rade 1 diastolic dysfunction. Mild leftatrial enlargement. MAC with trivial mitral regurgitation. Trivial tricuspid regurgitation. PASP 42 mmHg.  If labs acceptable day of surgery, I anticipate pt can proceed with surgery as scheduled.   AngWilleen CassNP-BC [facility]/Anest[facility]iology Phone: [phone]/21/2018 2:48 PM

## 2017-09-06 NOTE — Progress Notes (Signed)
Left voice message regarding pre-op medications to take DOS and phone # to PAT.

## 2017-09-08 ENCOUNTER — Other Ambulatory Visit: Payer: Self-pay | Admitting: Radiology

## 2017-09-10 NOTE — Anesthesia Preprocedure Evaluation (Addendum)
Anesthesia Evaluation    Airway        Dental   Pulmonary shortness of breath, former smoker,           Cardiovascular hypertension, Pt. on medications and Pt. on home beta blockers + Peripheral Vascular Disease    Nuclear stress test 08/05/15: There was no ST segment deviation noted during stress. Normal myocardial perfusion with soft tissue attenuation artifact. No ischemia or scar. This is a low risk study. Nuclear stress EF: 64%.     Neuro/Psych TIACVA    GI/Hepatic Neg liver ROS, GERD  Medicated,  Endo/Other  Hypothyroidism   Renal/GU Renal disease     Musculoskeletal  (+) Arthritis ,   Abdominal   Peds  Hematology negative hematology ROS (+)   Anesthesia Other Findings   Reproductive/Obstetrics                             Lab Results  Component Value Date   WBC 6.0 08/04/2017   HGB 12.0 (L) 08/04/2017   HCT 36.9 (L) 08/04/2017   MCV 88.7 08/04/2017   PLT 199 08/04/2017   Lab Results  Component Value Date   CREATININE 1.46 (H) 08/04/2017   BUN 17 08/04/2017   NA 136 08/04/2017   K 4.3 08/04/2017   CL 102 08/04/2017   CO2 26 08/04/2017   Lab Results  Component Value Date   INR 1.06 08/04/2017   INR 2.9 (H) 09/30/2008   INR 1.9 (H) 09/29/2008    Anesthesia Physical Anesthesia Plan  ASA: III  Anesthesia Plan: General   Post-op Pain Management:    Induction: Intravenous  PONV Risk Score and Plan:   Airway Management Planned: Oral ETT  Additional Equipment: Arterial line  Intra-op Plan:   Post-operative Plan: Extubation in OR  Informed Consent:   Plan Discussed with:   Anesthesia Plan Comments: (Cancelled 2/2 inadequate P2Y12.)       Anesthesia Quick Evaluation

## 2017-09-11 ENCOUNTER — Encounter (HOSPITAL_COMMUNITY): Payer: Self-pay

## 2017-09-11 ENCOUNTER — Ambulatory Visit (HOSPITAL_COMMUNITY)
Admission: RE | Admit: 2017-09-11 | Discharge: 2017-09-11 | Disposition: A | Discharge status: Home / Self Care | Payer: Medicare Other | Source: Ambulatory Visit | Attending: Interventional Radiology | Admitting: Interventional Radiology

## 2017-09-11 ENCOUNTER — Encounter (HOSPITAL_COMMUNITY)
Admission: RE | Disposition: A | Discharge status: Home / Self Care | Payer: Self-pay | Source: Ambulatory Visit | Attending: Interventional Radiology

## 2017-09-11 DIAGNOSIS — I129 Hypertensive chronic kidney disease with stage 1 through stage 4 chronic kidney disease, or unspecified chronic kidney disease: Secondary | ICD-10-CM | POA: Diagnosis not present

## 2017-09-11 DIAGNOSIS — I668 Occlusion and stenosis of other cerebral arteries: Secondary | ICD-10-CM | POA: Insufficient documentation

## 2017-09-11 DIAGNOSIS — Z8673 Personal history of transient ischemic attack (TIA), and cerebral infarction without residual deficits: Secondary | ICD-10-CM | POA: Diagnosis not present

## 2017-09-11 DIAGNOSIS — E039 Hypothyroidism, unspecified: Secondary | ICD-10-CM | POA: Diagnosis not present

## 2017-09-11 DIAGNOSIS — Z538 Procedure and treatment not carried out for other reasons: Secondary | ICD-10-CM | POA: Diagnosis not present

## 2017-09-11 DIAGNOSIS — Z87891 Personal history of nicotine dependence: Secondary | ICD-10-CM | POA: Diagnosis not present

## 2017-09-11 DIAGNOSIS — Z7902 Long term (current) use of antithrombotics/antiplatelets: Secondary | ICD-10-CM | POA: Insufficient documentation

## 2017-09-11 DIAGNOSIS — Z79899 Other long term (current) drug therapy: Secondary | ICD-10-CM | POA: Diagnosis not present

## 2017-09-11 DIAGNOSIS — E785 Hyperlipidemia, unspecified: Secondary | ICD-10-CM | POA: Insufficient documentation

## 2017-09-11 DIAGNOSIS — N189 Chronic kidney disease, unspecified: Secondary | ICD-10-CM | POA: Diagnosis not present

## 2017-09-11 DIAGNOSIS — Z7982 Long term (current) use of aspirin: Secondary | ICD-10-CM | POA: Insufficient documentation

## 2017-09-11 HISTORY — DX: Presence of spectacles and contact lenses: Z97.3

## 2017-09-11 HISTORY — DX: Headache, unspecified: R51.9

## 2017-09-11 HISTORY — DX: Overactive bladder: N32.81

## 2017-09-11 HISTORY — DX: Gastro-esophageal reflux disease without esophagitis: K21.9

## 2017-09-11 HISTORY — DX: Unspecified osteoarthritis, unspecified site: M19.90

## 2017-09-11 HISTORY — DX: Dyspnea, unspecified: R06.00

## 2017-09-11 HISTORY — DX: Transient cerebral ischemic attack, unspecified: G45.9

## 2017-09-11 HISTORY — DX: Benign prostatic hyperplasia without lower urinary tract symptoms: N40.0

## 2017-09-11 HISTORY — DX: Headache: R51

## 2017-09-11 LAB — COMPREHENSIVE METABOLIC PANEL
ALBUMIN: 3.4 g/dL — AB (ref 3.5–5.0)
ALT: 14 U/L — ABNORMAL LOW (ref 17–63)
ANION GAP: 11 (ref 5–15)
AST: 17 U/L (ref 15–41)
Alkaline Phosphatase: 53 U/L (ref 38–126)
BUN: 17 mg/dL (ref 6–20)
CO2: 23 mmol/L (ref 22–32)
Calcium: 8.9 mg/dL (ref 8.9–10.3)
Chloride: 105 mmol/L (ref 101–111)
Creatinine, Ser: 1.28 mg/dL — ABNORMAL HIGH (ref 0.61–1.24)
GFR calc non Af Amer: 49 mL/min — ABNORMAL LOW (ref >60)
GFR, EST AFRICAN AMERICAN: 56 mL/min — AB (ref >60)
GLUCOSE: 107 mg/dL — AB (ref 65–99)
POTASSIUM: 4.1 mmol/L (ref 3.5–5.1)
SODIUM: 139 mmol/L (ref 135–145)
TOTAL PROTEIN: 6.6 g/dL (ref 6.5–8.1)
Total Bilirubin: 0.7 mg/dL (ref 0.3–1.2)

## 2017-09-11 LAB — CBC WITH DIFFERENTIAL/PLATELET
BASOS ABS: 0 10*3/uL (ref 0.0–0.1)
BASOS PCT: 0 %
Eosinophils Absolute: 0.1 10*3/uL (ref 0.0–0.7)
Eosinophils Relative: 2 %
HEMATOCRIT: 36.4 % — AB (ref 39.0–52.0)
HEMOGLOBIN: 11.8 g/dL — AB (ref 13.0–17.0)
LYMPHS PCT: 18 %
Lymphs Abs: 1.3 10*3/uL (ref 0.7–4.0)
MCH: 28.6 pg (ref 26.0–34.0)
MCHC: 32.4 g/dL (ref 30.0–36.0)
MCV: 88.3 fL (ref 78.0–100.0)
MONOS PCT: 10 %
Monocytes Absolute: 0.7 10*3/uL (ref 0.1–1.0)
NEUTROS ABS: 5.1 10*3/uL (ref 1.7–7.7)
NEUTROS PCT: 70 %
Platelets: 245 10*3/uL (ref 150–400)
RBC: 4.12 MIL/uL — ABNORMAL LOW (ref 4.22–5.81)
RDW: 15 % (ref 11.5–15.5)
WBC: 7.3 10*3/uL (ref 4.0–10.5)

## 2017-09-11 LAB — PLATELET INHIBITION P2Y12: Platelet Function  P2Y12: 268 [PRU] (ref 194–418)

## 2017-09-11 LAB — PROTIME-INR
INR: 1.03
PROTHROMBIN TIME: 13.5 s (ref 11.4–15.2)

## 2017-09-11 LAB — APTT: APTT: 38 s — AB (ref 24–36)

## 2017-09-11 SURGERY — IR WITH ANESTHESIA
Anesthesia: General

## 2017-09-11 MED ORDER — SODIUM CHLORIDE 0.9 % IV SOLN: INTRAVENOUS | Status: DC

## 2017-09-11 MED ORDER — CLOPIDOGREL BISULFATE 75 MG PO TABS
75.0000 mg | ORAL_TABLET | ORAL | Status: DC
  Filled 2017-09-11 (×2): qty 1

## 2017-09-11 MED ORDER — ASPIRIN EC 325 MG PO TBEC
325.0000 mg | DELAYED_RELEASE_TABLET | ORAL | Status: DC
  Filled 2017-09-11 (×2): qty 1

## 2017-09-11 MED ORDER — NIMODIPINE 30 MG PO CAPS
0.0000 mg | ORAL_CAPSULE | ORAL | Status: DC
  Filled 2017-09-11 (×2): qty 2

## 2017-09-11 MED ORDER — CEFAZOLIN SODIUM-DEXTROSE 2-4 GM/100ML-% IV SOLN
2.0000 g | INTRAVENOUS | Status: DC
  Filled 2017-09-11 (×2): qty 100

## 2017-09-11 NOTE — Progress Notes (Signed)
Calls made to [place], [place] for Dr. Estanislado Pandy. Will wait for Dr. Estanislado Pandy to review lab results & visit pt. For a decision about whether the procedure will go on today.

## 2017-09-11 NOTE — Progress Notes (Signed)
Pt. D/c'd ambulatory with wife & daughter,.

## 2017-09-11 NOTE — Progress Notes (Signed)
Patient presented for angiogram and possible stenting procedure with Dr. Estanislado Pandy today.  He has been taking Plavix daily for the past 2-3 weeks.  Unfortunately [name] today is 268.  Unable to proceed with intervention today.  Patient to transition to Brilinta 90 mg twice daily with aspirin 81 mg. He is given an prescription for Brilinta.  He voices understanding of events of today and plan to reschedule.   Brynda Greathouse, MS RD PA-C 8:58 AM

## 2017-09-12 DIAGNOSIS — I4891 Unspecified atrial fibrillation: Secondary | ICD-10-CM | POA: Diagnosis not present

## 2017-09-12 DIAGNOSIS — K219 Gastro-esophageal reflux disease without esophagitis: Secondary | ICD-10-CM | POA: Diagnosis not present

## 2017-09-12 DIAGNOSIS — R0602 Shortness of breath: Secondary | ICD-10-CM | POA: Diagnosis not present

## 2017-09-12 DIAGNOSIS — E785 Hyperlipidemia, unspecified: Secondary | ICD-10-CM | POA: Diagnosis present

## 2017-09-12 DIAGNOSIS — Z79899 Other long term (current) drug therapy: Secondary | ICD-10-CM | POA: Diagnosis not present

## 2017-09-12 DIAGNOSIS — I10 Essential (primary) hypertension: Secondary | ICD-10-CM | POA: Diagnosis not present

## 2017-09-12 DIAGNOSIS — Z7982 Long term (current) use of aspirin: Secondary | ICD-10-CM | POA: Diagnosis not present

## 2017-09-12 DIAGNOSIS — R7989 Other specified abnormal findings of blood chemistry: Secondary | ICD-10-CM | POA: Diagnosis not present

## 2017-09-12 DIAGNOSIS — I509 Heart failure, unspecified: Secondary | ICD-10-CM | POA: Diagnosis not present

## 2017-09-12 DIAGNOSIS — Z79891 Long term (current) use of opiate analgesic: Secondary | ICD-10-CM | POA: Diagnosis not present

## 2017-09-12 DIAGNOSIS — Z8673 Personal history of transient ischemic attack (TIA), and cerebral infarction without residual deficits: Secondary | ICD-10-CM | POA: Diagnosis not present

## 2017-09-12 DIAGNOSIS — G8929 Other chronic pain: Secondary | ICD-10-CM | POA: Diagnosis present

## 2017-09-12 DIAGNOSIS — Z85820 Personal history of malignant melanoma of skin: Secondary | ICD-10-CM | POA: Diagnosis not present

## 2017-09-12 DIAGNOSIS — G459 Transient cerebral ischemic attack, unspecified: Secondary | ICD-10-CM | POA: Diagnosis not present

## 2017-09-12 DIAGNOSIS — Z87891 Personal history of nicotine dependence: Secondary | ICD-10-CM | POA: Diagnosis not present

## 2017-09-12 DIAGNOSIS — E039 Hypothyroidism, unspecified: Secondary | ICD-10-CM | POA: Diagnosis present

## 2017-09-12 DIAGNOSIS — I739 Peripheral vascular disease, unspecified: Secondary | ICD-10-CM | POA: Diagnosis not present

## 2017-09-12 DIAGNOSIS — M545 Low back pain: Secondary | ICD-10-CM | POA: Diagnosis present

## 2017-09-12 DIAGNOSIS — I110 Hypertensive heart disease with heart failure: Secondary | ICD-10-CM | POA: Diagnosis not present

## 2017-09-15 ENCOUNTER — Encounter: Payer: Self-pay | Admitting: Cardiovascular Disease

## 2017-09-15 ENCOUNTER — Ambulatory Visit (INDEPENDENT_AMBULATORY_CARE_PROVIDER_SITE_OTHER): Payer: Medicare Other | Admitting: Cardiovascular Disease

## 2017-09-15 ENCOUNTER — Other Ambulatory Visit: Payer: Self-pay

## 2017-09-15 VITALS — BP 142/80 | HR 86 | Ht 66.0 in | Wt 190.0 lb

## 2017-09-15 DIAGNOSIS — I651 Occlusion and stenosis of basilar artery: Secondary | ICD-10-CM | POA: Diagnosis not present

## 2017-09-15 DIAGNOSIS — Z9289 Personal history of other medical treatment: Secondary | ICD-10-CM | POA: Diagnosis not present

## 2017-09-15 DIAGNOSIS — I6521 Occlusion and stenosis of right carotid artery: Secondary | ICD-10-CM

## 2017-09-15 DIAGNOSIS — I4891 Unspecified atrial fibrillation: Secondary | ICD-10-CM

## 2017-09-15 DIAGNOSIS — I10 Essential (primary) hypertension: Secondary | ICD-10-CM | POA: Diagnosis not present

## 2017-09-15 DIAGNOSIS — R0609 Other forms of dyspnea: Secondary | ICD-10-CM

## 2017-09-15 DIAGNOSIS — I6509 Occlusion and stenosis of unspecified vertebral artery: Secondary | ICD-10-CM | POA: Diagnosis not present

## 2017-09-15 MED ORDER — DILTIAZEM HCL 30 MG PO TABS: 30.0000 mg | ORAL_TABLET | Freq: Two times a day (BID) | ORAL | 6 refills | Status: DC

## 2017-09-15 NOTE — Patient Instructions (Signed)
Medication Instructions:   Begin Diltiazem (short acting) 30mg  twice a day.  Continue all other medications.    Labwork: none  Testing/Procedures:  Your physician has requested that you have an echocardiogram. Echocardiography is a painless test that uses sound waves to create images of your heart. It provides your doctor with information about the size and shape of your heart and how well your heart's chambers and valves are working. This procedure takes approximately one hour. There are no restrictions for this procedure.  Office will call if Echo not done at [place]: 6 weeks   Any Other Special Instructions Will Be Listed Below (If Applicable).  If you need a refill on your cardiac medications before your next appointment, please call your pharmacy.

## 2017-09-15 NOTE — Progress Notes (Signed)
SUBJECTIVE: The patient presents for follow-up after being hospitalized for rapid atrial fibrillation at [facility] this month.  I had seen him on 08/25/17 and he was doing well. He has 89% stenosis of the right vertebrobasilar junction and had been scheduled to undergo endovascular revascularization.  I personally reviewed all relevant documentation, labs, and studies pertaining to this hospitalization.  Pertinent labs on 09/12/17: BUN 21, creatinine 1.29, d-dimer 0.79, hemoglobin 11.9, N-terminal proBNP 2080, platelets 241, TSH 3.49, normal troponins.  He was started on intravenous diltiazem infusion.  CT angiogram of the chest showed no evidence for acute pulmonary embolus.  There was aortic atherosclerosis with three-vessel coronary artery calcification, a small hiatal hernia, and small pleural effusions.  I personally reviewed the ECG which demonstrated rapid atrial fibrillation with PVCs, 115 bpm.  An echocardiogram may have been performed and I will have to request these results.  He is presently on aspirin and Brilinta.  He is also on labetalol.  He was discharged yesterday.  He was given a prescription for long-acting diltiazem which he has not filled.  He has exertional dyspnea but denies chest pain and palpitations.   Review of Systems: As per "subjective", otherwise negative.  Allergies  Allergen Reactions  . Tramadol Nausea Only    Current Outpatient Medications  Medication Sig Dispense Refill  . acetaminophen (TYLENOL) 500 MG tablet Take 1,000 mg 2 (two) times daily as needed by mouth for mild pain or moderate pain.     Marland Kitchen aspirin EC 81 MG tablet Take 81 mg by mouth daily.    Marland Kitchen atorvastatin (LIPITOR) 10 MG tablet Take 10 mg by mouth every evening.     . Carboxymethylcellul-Glycerin (LUBRICATING EYE DROPS OP) Apply 1 drop daily as needed to eye (dry eyes).    . clopidogrel (PLAVIX) 75 MG tablet Take 75 mg every evening by mouth.     . doxazosin (CARDURA) 8  MG tablet Take 8 mg by mouth at bedtime.    . fluticasone (FLONASE) 50 MCG/ACT nasal spray Place 1 spray into both nostrils daily as needed for allergies or rhinitis.    Marland Kitchen labetalol (NORMODYNE) 200 MG tablet Take 200 mg by mouth 2 (two) times daily.    Marland Kitchen levothyroxine (SYNTHROID, LEVOTHROID) 150 MCG tablet Take 150 mcg daily before breakfast by mouth.    Marland Kitchen lisinopril (PRINIVIL,ZESTRIL) 40 MG tablet Take 40 mg by mouth daily.    Marland Kitchen omeprazole (PRILOSEC) 20 MG capsule Take 20 mg by mouth daily.    Marland Kitchen OVER THE COUNTER MEDICATION Place 15 drops 2 (two) times daily as needed under the tongue (pain). MPV 100 - Hemp     . OVER THE COUNTER MEDICATION Take 5 mLs by mouth daily. Mix with 4-6 ounces of water daily - SUPER BEET    . oxyCODONE (OXY IR/ROXICODONE) 5 MG immediate release tablet Take 5 mg 2 (two) times daily as needed by mouth for severe pain.     No current facility-administered medications for this visit.     Past Medical History:  Diagnosis Date  . Arrhythmia   . Arthritis   . Chronic kidney disease   . Dyspnea    with exertion; chronic  . Enlarged prostate   . GERD (gastroesophageal reflux disease)   . Headache   . Hyperlipidemia   . Hypertension   . Hypothyroidism   . Overactive bladder   . TIA (transient ischemic attack)   . Wears glasses     Past Surgical  History:  Procedure Laterality Date  . BACK SURGERY     x 2  . CATARACT EXTRACTION    . COLONOSCOPY    . IR ANGIO INTRA EXTRACRAN SEL COM CAROTID INNOMINATE BILAT MOD SED  08/04/2017  . IR ANGIO VERTEBRAL SEL VERTEBRAL BILAT MOD SED  08/04/2017  . IR RADIOLOGIST EVAL & MGMT  07/28/2017  . IR RADIOLOGIST EVAL & MGMT  08/22/2017  . JOINT REPLACEMENT     B/L knees and left hip  . KNEE SURGERY      Social History   Socioeconomic History  . Marital status: Married    Spouse name: Not on file  . Number of children: Not on file  . Years of education: Not on file  . Highest education level: Not on file  Social  Needs  . Financial resource strain: Not on file  . Food insecurity - worry: Not on file  . Food insecurity - inability: Not on file  . Transportation needs - medical: Not on file  . Transportation needs - non-medical: Not on file  Occupational History  . Not on file  Tobacco Use  . Smoking status: Former Smoker    Types: Cigarettes    Start date: 10/17/1965    Last attempt to quit: 10/17/1978    Years since quitting: 38.9  . Smokeless tobacco: Never Used  Substance and Sexual Activity  . Alcohol use: No    Alcohol/week: 0.0 oz  . Drug use: No  . Sexual activity: Not on file  Other Topics Concern  . Not on file  Social History Narrative  . Not on file     Vitals:   09/15/17 1333  Weight: 190 lb (86.2 kg)  Height: 5\' 6"  (1.676 m)    Wt Readings from Last 3 Encounters:  09/15/17 190 lb (86.2 kg)  09/11/17 190 lb (86.2 kg)  08/25/17 190 lb (86.2 kg)     PHYSICAL EXAM General: NAD HEENT: Normal. Neck: No JVD, no thyromegaly. Lungs: Clear to auscultation bilaterally with normal respiratory effort. CV: Heart rate at upper normal limits, irregular rhythm, normal S1/S2, no S3, no murmur. No pretibial or periankle edema.     Abdomen: Soft, nontender, no distention.  Neurologic: Alert and oriented.  Psych: Normal affect. Skin: Normal. Musculoskeletal: No gross deformities.    ECG: Most recent ECG reviewed.   Labs: Lab Results  Component Value Date/Time   K 4.1 09/11/2017 06:54 AM   BUN 17 09/11/2017 06:54 AM   CREATININE 1.28 (H) 09/11/2017 06:54 AM   ALT 14 (L) 09/11/2017 06:54 AM   HGB 11.8 (L) 09/11/2017 06:54 AM     Lipids: No results found for: LDLCALC, LDLDIRECT, CHOL, TRIG, HDL     ASSESSMENT AND PLAN: 1.  Atrial fibrillation with exertional dyspnea: Heart rate is suboptimally controlled on labetalol 200 mg twice daily.  I will add short acting diltiazem 30 mg twice daily.  He is currently on aspirin and Brilinta in preparation for neurovascular  intervention.  He will ultimately need an anticoagulant such as Xarelto or Eliquis.  I will defer to Dr. Estanislado Pandy as to the timing of initiating this.  I will obtain an echocardiogram to evaluate cardiac structure and function and left atrial size in particular.  2.  Chronic hypertension: Blood pressure is mildly elevated.  I am starting short acting diltiazem 30 mg twice daily for atrial fibrillation heart rate control.  3.  Right vertebrobasilar junction stenosis of 89%: He is presently on aspirin and  Brilinta in preparation for endovascular stenting.  He is also on Lipitor.  I will defer to Dr. Estanislado Pandy as to the timing of initiating an anticoagulant such as Eliquis or Xarelto for atrial fibrillation given the need for dual antiplatelet therapy.   Disposition: Follow up 6 weeks.  Time spent: 40 minutes, of which greater than 50% was spent reviewing symptoms, relevant blood tests and studies, and discussing management plan with the patient.  Kate Sable, M.D., F.A.C.C.

## 2017-09-18 ENCOUNTER — Other Ambulatory Visit: Payer: Self-pay | Admitting: Radiology

## 2017-09-18 ENCOUNTER — Other Ambulatory Visit (HOSPITAL_COMMUNITY): Payer: Self-pay

## 2017-09-18 LAB — PLATELET INHIBITION P2Y12: PLATELET FUNCTION P2Y12: 6 [PRU] — AB (ref 194–418)

## 2017-09-19 ENCOUNTER — Encounter: Payer: Self-pay | Admitting: *Deleted

## 2017-09-20 ENCOUNTER — Telehealth: Payer: Self-pay | Admitting: Cardiovascular Disease

## 2017-09-20 ENCOUNTER — Other Ambulatory Visit (HOSPITAL_COMMUNITY): Payer: Self-pay | Admitting: *Deleted

## 2017-09-20 ENCOUNTER — Telehealth (HOSPITAL_COMMUNITY): Payer: Self-pay | Admitting: *Deleted

## 2017-09-20 DIAGNOSIS — C61 Malignant neoplasm of prostate: Secondary | ICD-10-CM | POA: Diagnosis not present

## 2017-09-20 DIAGNOSIS — J019 Acute sinusitis, unspecified: Secondary | ICD-10-CM | POA: Diagnosis not present

## 2017-09-20 DIAGNOSIS — I679 Cerebrovascular disease, unspecified: Secondary | ICD-10-CM | POA: Diagnosis not present

## 2017-09-20 DIAGNOSIS — E039 Hypothyroidism, unspecified: Secondary | ICD-10-CM | POA: Diagnosis not present

## 2017-09-20 DIAGNOSIS — N182 Chronic kidney disease, stage 2 (mild): Secondary | ICD-10-CM | POA: Diagnosis not present

## 2017-09-20 DIAGNOSIS — I6521 Occlusion and stenosis of right carotid artery: Secondary | ICD-10-CM | POA: Diagnosis not present

## 2017-09-20 DIAGNOSIS — R0982 Postnasal drip: Secondary | ICD-10-CM | POA: Diagnosis not present

## 2017-09-20 DIAGNOSIS — I10 Essential (primary) hypertension: Secondary | ICD-10-CM | POA: Diagnosis not present

## 2017-09-20 NOTE — Telephone Encounter (Signed)
Patient notified

## 2017-09-20 NOTE — Telephone Encounter (Signed)
Echo in media from 06/22/2017. Do you want another one?

## 2017-09-20 NOTE — Telephone Encounter (Signed)
Called and spoke with patient.  Per Dr. Estanislado Pandy patient is to continue Brlinta 90mg  BID and ASA 81mg  once a day.  Pt voiced understanding.  The scheduler will call to schedule procedure

## 2017-09-20 NOTE — Telephone Encounter (Signed)
Would like to know if an ECHO will be ordered for him to do

## 2017-09-20 NOTE — Telephone Encounter (Signed)
No, this is sufficient. But please have it placed under the CV Procedure tab so that anyone who is looking for it can easily find it.

## 2017-09-22 ENCOUNTER — Telehealth: Payer: Self-pay | Admitting: Physician Assistant

## 2017-09-22 NOTE — Telephone Encounter (Signed)
Paged for weakness and SOB. HR 109, SBP 162. Recommended to increase 30 mg short acting diltiazem to every 6 hours for better rate control. Keep BP and HR diary and call us if resting HR persistently over 100  Hilbert Corrigan PA Pager: [phone]

## 2017-10-14 DIAGNOSIS — I6521 Occlusion and stenosis of right carotid artery: Secondary | ICD-10-CM | POA: Diagnosis not present

## 2017-10-14 DIAGNOSIS — E039 Hypothyroidism, unspecified: Secondary | ICD-10-CM | POA: Diagnosis not present

## 2017-10-14 DIAGNOSIS — J019 Acute sinusitis, unspecified: Secondary | ICD-10-CM | POA: Diagnosis not present

## 2017-10-14 DIAGNOSIS — N182 Chronic kidney disease, stage 2 (mild): Secondary | ICD-10-CM | POA: Diagnosis not present

## 2017-10-14 DIAGNOSIS — R0982 Postnasal drip: Secondary | ICD-10-CM | POA: Diagnosis not present

## 2017-10-14 DIAGNOSIS — I679 Cerebrovascular disease, unspecified: Secondary | ICD-10-CM | POA: Diagnosis not present

## 2017-10-14 DIAGNOSIS — I10 Essential (primary) hypertension: Secondary | ICD-10-CM | POA: Diagnosis not present

## 2017-10-14 DIAGNOSIS — C61 Malignant neoplasm of prostate: Secondary | ICD-10-CM | POA: Diagnosis not present

## 2017-10-20 ENCOUNTER — Telehealth (HOSPITAL_COMMUNITY): Payer: Self-pay | Admitting: Radiology

## 2017-10-20 NOTE — Telephone Encounter (Signed)
Called pt discuss procedure date and P2Y12 recheck. Left VM. JM

## 2017-10-23 ENCOUNTER — Telehealth (HOSPITAL_COMMUNITY): Payer: Self-pay | Admitting: Radiology

## 2017-10-23 ENCOUNTER — Other Ambulatory Visit (HOSPITAL_COMMUNITY): Payer: Self-pay | Admitting: Radiology

## 2017-10-23 DIAGNOSIS — I69398 Other sequelae of cerebral infarction: Secondary | ICD-10-CM | POA: Diagnosis not present

## 2017-10-23 DIAGNOSIS — I129 Hypertensive chronic kidney disease with stage 1 through stage 4 chronic kidney disease, or unspecified chronic kidney disease: Secondary | ICD-10-CM | POA: Diagnosis not present

## 2017-10-23 DIAGNOSIS — N189 Chronic kidney disease, unspecified: Secondary | ICD-10-CM | POA: Diagnosis not present

## 2017-10-23 DIAGNOSIS — I651 Occlusion and stenosis of basilar artery: Secondary | ICD-10-CM | POA: Diagnosis not present

## 2017-10-23 DIAGNOSIS — I63231 Cerebral infarction due to unspecified occlusion or stenosis of right carotid arteries: Secondary | ICD-10-CM

## 2017-10-23 DIAGNOSIS — Z8249 Family history of ischemic heart disease and other diseases of the circulatory system: Secondary | ICD-10-CM | POA: Diagnosis not present

## 2017-10-23 DIAGNOSIS — I4891 Unspecified atrial fibrillation: Secondary | ICD-10-CM | POA: Diagnosis not present

## 2017-10-23 DIAGNOSIS — Z87891 Personal history of nicotine dependence: Secondary | ICD-10-CM | POA: Diagnosis not present

## 2017-10-23 DIAGNOSIS — R439 Unspecified disturbances of smell and taste: Secondary | ICD-10-CM | POA: Diagnosis not present

## 2017-10-23 DIAGNOSIS — I6501 Occlusion and stenosis of right vertebral artery: Secondary | ICD-10-CM | POA: Diagnosis not present

## 2017-10-23 DIAGNOSIS — I739 Peripheral vascular disease, unspecified: Secondary | ICD-10-CM | POA: Diagnosis not present

## 2017-10-23 DIAGNOSIS — Z85820 Personal history of malignant melanoma of skin: Secondary | ICD-10-CM | POA: Diagnosis not present

## 2017-10-23 DIAGNOSIS — Z7902 Long term (current) use of antithrombotics/antiplatelets: Secondary | ICD-10-CM | POA: Diagnosis not present

## 2017-10-23 DIAGNOSIS — K219 Gastro-esophageal reflux disease without esophagitis: Secondary | ICD-10-CM | POA: Diagnosis not present

## 2017-10-23 LAB — PLATELET INHIBITION P2Y12: Platelet Function  P2Y12: 54 [PRU] — ABNORMAL LOW (ref 194–418)

## 2017-10-23 NOTE — Telephone Encounter (Signed)
Called Scott [name], told him that per Dr. Estanislado Pandy we will be proceeding with his procedure on 11/01/17. The patient is in agreement with this plan of care. JM

## 2017-10-26 ENCOUNTER — Ambulatory Visit (INDEPENDENT_AMBULATORY_CARE_PROVIDER_SITE_OTHER): Payer: Medicare Other | Admitting: Cardiovascular Disease

## 2017-10-26 ENCOUNTER — Encounter: Payer: Self-pay | Admitting: Cardiovascular Disease

## 2017-10-26 VITALS — BP 118/84 | HR 88 | Ht 66.0 in | Wt 184.0 lb

## 2017-10-26 DIAGNOSIS — I4891 Unspecified atrial fibrillation: Secondary | ICD-10-CM

## 2017-10-26 DIAGNOSIS — I10 Essential (primary) hypertension: Secondary | ICD-10-CM

## 2017-10-26 DIAGNOSIS — R0609 Other forms of dyspnea: Secondary | ICD-10-CM | POA: Diagnosis not present

## 2017-10-26 DIAGNOSIS — I6509 Occlusion and stenosis of unspecified vertebral artery: Secondary | ICD-10-CM

## 2017-10-26 DIAGNOSIS — I651 Occlusion and stenosis of basilar artery: Secondary | ICD-10-CM

## 2017-10-26 MED ORDER — DILTIAZEM HCL 60 MG PO TABS: 60.0000 mg | ORAL_TABLET | Freq: Two times a day (BID) | ORAL | 6 refills | Status: AC

## 2017-10-26 NOTE — Progress Notes (Signed)
SUBJECTIVE: The patient presents for follow-up of atrial fibrillation.  Echocardiogram performed at [facility] on 06/22/17 demonstrated normal left ventricular systolic and diastolic function, LVEF 41-74%, moderate concentric LVH, and mild left atrial dilatation.  He is scheduled for neurovascular intervention on 11/01/17 for 89% stenosis of the right vertebrobasilar junction.  He is currently taking diltiazem 30 mg 3 times daily.  For the most part his heart rate is in the 70-80 bpm range but he does have episodes of 215 bpm where he experiences shortness of breath.  He continues to have exertional dyspnea.  He is going to the [place] tomorrow to see if he can get his medications provided by them.  His wife says the patient has been struggling with some depression due to his health issues.    Review of Systems: As per "subjective", otherwise negative.  Allergies  Allergen Reactions  . Tramadol Nausea Only    Current Outpatient Medications  Medication Sig Dispense Refill  . acetaminophen (TYLENOL) 500 MG tablet Take 1,000 mg by mouth 2 (two) times daily.     Marland Kitchen aspirin EC 81 MG tablet Take 81 mg by mouth daily.    Marland Kitchen atorvastatin (LIPITOR) 10 MG tablet Take 10 mg by mouth every evening.     . Carboxymethylcellul-Glycerin (LUBRICATING EYE DROPS OP) Place 1-2 drops into both eyes 2 (two) times daily as needed (for dry eyes).     Marland Kitchen diltiazem (CARDIZEM) 30 MG tablet Take 1 tablet (30 mg total) by mouth 2 (two) times daily. (Patient taking differently: Take 30 mg by mouth 3 (three) times daily. ) 60 tablet 6  . doxazosin (CARDURA) 8 MG tablet Take 8 mg by mouth at bedtime.    . fluticasone (FLONASE) 50 MCG/ACT nasal spray Place 2 sprays into both nostrils daily as needed for allergies or rhinitis.     Marland Kitchen labetalol (NORMODYNE) 200 MG tablet Take 200 mg by mouth 2 (two) times daily.    Marland Kitchen levothyroxine (SYNTHROID, LEVOTHROID) 150 MCG tablet Take 150 mcg daily before breakfast by mouth.      Marland Kitchen lisinopril (PRINIVIL,ZESTRIL) 40 MG tablet Take 40 mg by mouth daily.    Marland Kitchen omeprazole (PRILOSEC) 20 MG capsule Take 20 mg by mouth daily.    Marland Kitchen oxyCODONE (OXY IR/ROXICODONE) 5 MG immediate release tablet Take 5 mg 2 (two) times daily as needed by mouth for severe pain.    . ticagrelor (BRILINTA) 90 MG TABS tablet Take 90 mg by mouth 2 (two) times daily.      No current facility-administered medications for this visit.     Past Medical History:  Diagnosis Date  . Arrhythmia   . Arthritis   . Chronic kidney disease   . Dyspnea    with exertion; chronic  . Enlarged prostate   . GERD (gastroesophageal reflux disease)   . Headache   . Hyperlipidemia   . Hypertension   . Hypothyroidism   . Overactive bladder   . TIA (transient ischemic attack)   . Wears glasses     Past Surgical History:  Procedure Laterality Date  . BACK SURGERY     x 2  . CATARACT EXTRACTION    . COLONOSCOPY    . IR ANGIO INTRA EXTRACRAN SEL COM CAROTID INNOMINATE BILAT MOD SED  08/04/2017  . IR ANGIO VERTEBRAL SEL VERTEBRAL BILAT MOD SED  08/04/2017  . IR RADIOLOGIST EVAL & MGMT  07/28/2017  . IR RADIOLOGIST EVAL & MGMT  08/22/2017  .  JOINT REPLACEMENT     B/L knees and left hip  . KNEE SURGERY      Social History   Socioeconomic History  . Marital status: Married    Spouse name: Not on file  . Number of children: Not on file  . Years of education: Not on file  . Highest education level: Not on file  Social Needs  . Financial resource strain: Not on file  . Food insecurity - worry: Not on file  . Food insecurity - inability: Not on file  . Transportation needs - medical: Not on file  . Transportation needs - non-medical: Not on file  Occupational History  . Not on file  Tobacco Use  . Smoking status: Former Smoker    Types: Cigarettes    Start date: 10/17/1965    Last attempt to quit: 10/17/1978    Years since quitting: 39.0  . Smokeless tobacco: Never Used  Substance and Sexual Activity  .  Alcohol use: No    Alcohol/week: 0.0 oz  . Drug use: No  . Sexual activity: Not on file  Other Topics Concern  . Not on file  Social History Narrative  . Not on file     Vitals:   10/26/17 1257  BP: 118/84  Pulse: 88  SpO2: 97%  Weight: 184 lb (83.5 kg)  Height: 5\' 6"  (1.676 m)    Wt Readings from Last 3 Encounters:  10/26/17 184 lb (83.5 kg)  09/15/17 190 lb (86.2 kg)  09/11/17 190 lb (86.2 kg)     PHYSICAL EXAM General: NAD HEENT: Normal. Neck: No JVD, no thyromegaly. Lungs: Clear to auscultation bilaterally with normal respiratory effort. CV: Regular rate and irregular rhythm, normal S1/S2, no S3, no murmur. No pretibial or periankle edema.   Abdomen: Soft, nontender, no distention.  Neurologic: Alert and oriented.  Psych: Normal affect. Skin: Normal. Musculoskeletal: No gross deformities.    ECG: Most recent ECG reviewed.   Labs: Lab Results  Component Value Date/Time   K 4.1 09/11/2017 06:54 AM   BUN 17 09/11/2017 06:54 AM   CREATININE 1.28 (H) 09/11/2017 06:54 AM   ALT 14 (L) 09/11/2017 06:54 AM   HGB 11.8 (L) 09/11/2017 06:54 AM     Lipids: No results found for: LDLCALC, LDLDIRECT, CHOL, TRIG, HDL     ASSESSMENT AND PLAN: 1.  Atrial fibrillation with exertional dyspnea: Overall, heart rate appears to be fairly well controlled but he does continue to have exertional dyspnea.  I will increase diltiazem to 60 mg twice daily along with the labetalol 200 mg twice daily he is Artie taking.  I instructed him to take an extra 30 mg in the afternoon and should he experience elevated heart rates. He is currently on aspirin and Brilinta in preparation for neurovascular intervention.  He will ultimately need an anticoagulant such as Xarelto or Eliquis.  I will defer to Dr. Estanislado Pandy as to the timing of initiating this.    Left atrium is only mildly dilated. If he remains symptomatic at the time of his next appointment, I would consider direct current  cardioversion.  2.  Chronic hypertension: Blood pressure is controlled.  I will continue to monitor this given diltiazem adjustments as noted above.  3.  Right vertebrobasilar junction stenosis of 89%: He is presently on aspirin and Brilinta in preparation for endovascular stenting to be performed on 11/01/17.  He is also on Lipitor.  I will defer to Dr. Estanislado Pandy as to the timing of initiating  an anticoagulant such as Eliquis or Xarelto for atrial fibrillation given the need for dual antiplatelet therapy.    Disposition: Follow up 3 months   Kate Sable, M.D., F.A.C.C.

## 2017-10-26 NOTE — Patient Instructions (Signed)
Medication Instructions:   Increase Diltiazem to 60mg  twice a day, may take an extra 30mg  as needed in the evening.   Continue all other medications.    Labwork: none  Testing/Procedures: none  Follow-Up: 3 months   Any Other Special Instructions Will Be Listed Below (If Applicable).  If you need a refill on your cardiac medications before your next appointment, please call your pharmacy.

## 2017-10-30 ENCOUNTER — Other Ambulatory Visit: Payer: Self-pay | Admitting: Radiology

## 2017-10-31 ENCOUNTER — Other Ambulatory Visit: Payer: Self-pay

## 2017-10-31 ENCOUNTER — Other Ambulatory Visit: Payer: Self-pay | Admitting: Student

## 2017-10-31 ENCOUNTER — Encounter (HOSPITAL_COMMUNITY): Payer: Self-pay | Admitting: *Deleted

## 2017-10-31 ENCOUNTER — Other Ambulatory Visit: Payer: Self-pay | Admitting: Radiology

## 2017-10-31 MED ORDER — CLOPIDOGREL BISULFATE 75 MG PO TABS: 75.0000 mg | ORAL_TABLET | ORAL | Status: AC

## 2017-10-31 NOTE — Progress Notes (Signed)
Mr Dimalanta denies chest pain or palpations, he does get short of breath with exertion and "at least 1 night a week I get short of breath when I am in be, it wakes me up."  Patient states that he told his cardiologist when he  Saw him last week.  Charts given to PA/FNP to review.

## 2017-10-31 NOTE — Anesthesia Preprocedure Evaluation (Addendum)
Anesthesia Evaluation  Patient identified by MRN, date of birth, ID band Patient awake    Reviewed: Allergy & Precautions, NPO status , Patient's Chart, lab work & pertinent test results  History of Anesthesia Complications Negative for: history of anesthetic complications  Airway Mallampati: IV  TM Distance: >3 FB Neck ROM: Full    Dental  (+) Dental Advisory Given, Caps   Pulmonary shortness of breath, former smoker (quit 1980),    breath sounds clear to auscultation       Cardiovascular hypertension, Pt. on medications and Pt. on home beta blockers (-) angina+ Peripheral Vascular Disease   Rhythm:Regular Rate:Normal  '18 ECHO: EF 50-55%, valves OK '16 stress: EF 64%, no ischemia or infarct   Neuro/Psych Anxiety TIACVA (taste changed)    GI/Hepatic Neg liver ROS, GERD  Medicated and Controlled,  Endo/Other  Hypothyroidism   Renal/GU Renal InsufficiencyRenal disease (creat 1.44)   BPH    Musculoskeletal  (+) Arthritis , Osteoarthritis,    Abdominal (+) - obese,   Peds  Hematology Brilinta   Anesthesia Other Findings   Reproductive/Obstetrics                            Anesthesia Physical Anesthesia Plan  ASA: III  Anesthesia Plan: General   Post-op Pain Management:    Induction: Intravenous  PONV Risk Score and Plan: 2 and Ondansetron and Dexamethasone  Airway Management Planned: Oral ETT and Video Laryngoscope Planned  Additional Equipment:   Intra-op Plan:   Post-operative Plan: Possible Post-op intubation/ventilation  Informed Consent: I have reviewed the patients History and Physical, chart, labs and discussed the procedure including the risks, benefits and alternatives for the proposed anesthesia with the patient or authorized representative who has indicated his/her understanding and acceptance.   Dental advisory given  Plan Discussed with: CRNA and  Surgeon  Anesthesia Plan Comments: (Plan routine monitors, A line, GETA with possible post op ventilation)        Anesthesia Quick Evaluation

## 2017-10-31 NOTE — Progress Notes (Signed)
Anesthesia Chart Review:  Pt is a same day work up  Pt is an 84 year old male scheduled for stent to R vertebrobasilar junction on 11/01/2017 with Luanne Bras, MD  - PCP is Judd Lien, MD - Cardiologist is Kate Sable, MD who is aware of surgery. Last office visit 10/26/17.   PMH includes:  atrial fibrillation with RVR (08/2017), HTN, hyperlipidemia, CKD, arrhythmia (unspecified), hypothyroidism, TIA, DOE. Former smoker.  Medications include: ASA 81 mg, Lipitor, diltiazem, doxazosin, labetalol, levothyroxine, lisinopril, Prilosec, brilinta  Labs will be obtained day of surgery.   EKG 09/12/17: Atrial fibrillation (115 bpm). PVCs.  Borderline low voltage, extremity leads.  Anteroseptal infarct, age undetermined  Carotid duplex 06/22/17:  1. R ICA 50-69% stenosis 2. L ICA without hemodynamically significant stenosis  Nuclear stress test 08/05/15:   There was no ST segment deviation noted during stress.  Normal myocardial perfusion with soft tissue attenuation artifact. No ischemia or scar.  This is a low risk study.  Nuclear stress EF: 64%.  Echo 09/06/17:  - Left ventricle: The cavity size was normal. Wall thicknesswas normal. Systolic function was normal. The estimatedejection fraction was in the range of 50% to 55%. Wallmotion was normal; there were no regional wall motionabnormalities. Doppler parameters are consistent withabnormal left ventricular relaxation (grade 1 diastolic dysfunction). - Aortic valve: Mildly calcified annulus. Trileaflet. No significant regurgitation. Mean gradient: 32m Hg (S). - Mitral valve: Calcified annulus. Trivial regurgitation. - Left atrium: The atrium was mildly dilated. - Right atrium: The atrium was at the upper limits of normalin size. Central venous pressure: 844mHg (est). - Atrial septum: There was increased thickness of theseptum, consistent with lipomatous hypertrophy. No defector patent foramen ovale was  identified. - Tricuspid valve: Trivial regurgitation. - Pulmonary arteries: PA peak pressure: 4250mg (S). - Pericardium, extracardiac: There was no pericardialeffusion. - Impressions: No prior study for comparison. Normal LV wall thickness with LVEF 50-27-06%rade 1 diastolic dysfunction. Mild leftatrial enlargement. MAC with trivial mitral regurgitation. Trivial tricuspid regurgitation. PASP 42 mmHg.  If labs acceptable day of surgery, I anticipate pt can proceed with surgery as scheduled.   AngWilleen CassNP-BC [facility]/Anesthesiology Phone: [phone] 2:33 PM

## 2017-11-01 ENCOUNTER — Ambulatory Visit (HOSPITAL_COMMUNITY)
Admission: RE | Admit: 2017-11-01 | Discharge: 2017-11-01 | Disposition: A | Discharge status: Home / Self Care | Payer: Medicare Other | Source: Ambulatory Visit | Attending: Interventional Radiology | Admitting: Interventional Radiology

## 2017-11-01 ENCOUNTER — Ambulatory Visit (HOSPITAL_COMMUNITY): Payer: Medicare Other

## 2017-11-01 ENCOUNTER — Encounter (HOSPITAL_COMMUNITY): Payer: Self-pay | Admitting: Urology

## 2017-11-01 ENCOUNTER — Encounter (HOSPITAL_COMMUNITY)
Admission: RE | Disposition: A | Discharge status: Home / Self Care | Payer: Self-pay | Source: Ambulatory Visit | Attending: Interventional Radiology

## 2017-11-01 ENCOUNTER — Ambulatory Visit (HOSPITAL_COMMUNITY): Payer: Medicare Other | Admitting: Emergency Medicine

## 2017-11-01 DIAGNOSIS — I651 Occlusion and stenosis of basilar artery: Secondary | ICD-10-CM | POA: Insufficient documentation

## 2017-11-01 DIAGNOSIS — I739 Peripheral vascular disease, unspecified: Secondary | ICD-10-CM | POA: Insufficient documentation

## 2017-11-01 DIAGNOSIS — Z87891 Personal history of nicotine dependence: Secondary | ICD-10-CM | POA: Insufficient documentation

## 2017-11-01 DIAGNOSIS — K219 Gastro-esophageal reflux disease without esophagitis: Secondary | ICD-10-CM | POA: Insufficient documentation

## 2017-11-01 DIAGNOSIS — R439 Unspecified disturbances of smell and taste: Secondary | ICD-10-CM | POA: Insufficient documentation

## 2017-11-01 DIAGNOSIS — I10 Essential (primary) hypertension: Secondary | ICD-10-CM | POA: Diagnosis not present

## 2017-11-01 DIAGNOSIS — N189 Chronic kidney disease, unspecified: Secondary | ICD-10-CM | POA: Diagnosis not present

## 2017-11-01 DIAGNOSIS — Z96653 Presence of artificial knee joint, bilateral: Secondary | ICD-10-CM | POA: Insufficient documentation

## 2017-11-01 DIAGNOSIS — Z419 Encounter for procedure for purposes other than remedying health state, unspecified: Secondary | ICD-10-CM

## 2017-11-01 DIAGNOSIS — Z7902 Long term (current) use of antithrombotics/antiplatelets: Secondary | ICD-10-CM | POA: Insufficient documentation

## 2017-11-01 DIAGNOSIS — I69398 Other sequelae of cerebral infarction: Secondary | ICD-10-CM | POA: Insufficient documentation

## 2017-11-01 DIAGNOSIS — I129 Hypertensive chronic kidney disease with stage 1 through stage 4 chronic kidney disease, or unspecified chronic kidney disease: Secondary | ICD-10-CM | POA: Diagnosis not present

## 2017-11-01 DIAGNOSIS — Z85820 Personal history of malignant melanoma of skin: Secondary | ICD-10-CM | POA: Insufficient documentation

## 2017-11-01 DIAGNOSIS — I6501 Occlusion and stenosis of right vertebral artery: Secondary | ICD-10-CM | POA: Diagnosis not present

## 2017-11-01 DIAGNOSIS — I4891 Unspecified atrial fibrillation: Secondary | ICD-10-CM | POA: Insufficient documentation

## 2017-11-01 DIAGNOSIS — G450 Vertebro-basilar artery syndrome: Secondary | ICD-10-CM | POA: Insufficient documentation

## 2017-11-01 DIAGNOSIS — Z8249 Family history of ischemic heart disease and other diseases of the circulatory system: Secondary | ICD-10-CM | POA: Insufficient documentation

## 2017-11-01 DIAGNOSIS — I771 Stricture of artery: Secondary | ICD-10-CM

## 2017-11-01 DIAGNOSIS — Z01818 Encounter for other preprocedural examination: Secondary | ICD-10-CM | POA: Diagnosis not present

## 2017-11-01 DIAGNOSIS — E039 Hypothyroidism, unspecified: Secondary | ICD-10-CM | POA: Diagnosis not present

## 2017-11-01 HISTORY — DX: Cardiac arrhythmia, unspecified: I49.9

## 2017-11-01 HISTORY — DX: Anxiety disorder, unspecified: F41.9

## 2017-11-01 HISTORY — DX: Malignant (primary) neoplasm, unspecified: C80.1

## 2017-11-01 HISTORY — DX: Cerebral infarction, unspecified: I63.9

## 2017-11-01 HISTORY — PX: RADIOLOGY WITH ANESTHESIA: SHX6223

## 2017-11-01 HISTORY — PX: IR ANGIO VERTEBRAL SEL VERTEBRAL UNI R MOD SED: IMG5368

## 2017-11-01 LAB — CBC WITH DIFFERENTIAL/PLATELET
Basophils Absolute: 0 10*3/uL (ref 0.0–0.1)
Basophils Relative: 1 %
Eosinophils Absolute: 0.3 10*3/uL (ref 0.0–0.7)
Eosinophils Relative: 4 %
HEMATOCRIT: 38 % — AB (ref 39.0–52.0)
HEMOGLOBIN: 12.2 g/dL — AB (ref 13.0–17.0)
LYMPHS ABS: 1.3 10*3/uL (ref 0.7–4.0)
Lymphocytes Relative: 20 %
MCH: 28.4 pg (ref 26.0–34.0)
MCHC: 32.1 g/dL (ref 30.0–36.0)
MCV: 88.6 fL (ref 78.0–100.0)
MONO ABS: 0.8 10*3/uL (ref 0.1–1.0)
MONOS PCT: 12 %
NEUTROS ABS: 4.2 10*3/uL (ref 1.7–7.7)
NEUTROS PCT: 63 %
Platelets: 226 10*3/uL (ref 150–400)
RBC: 4.29 MIL/uL (ref 4.22–5.81)
RDW: 15.2 % (ref 11.5–15.5)
WBC: 6.6 10*3/uL (ref 4.0–10.5)

## 2017-11-01 LAB — COMPREHENSIVE METABOLIC PANEL
ALK PHOS: 57 U/L (ref 38–126)
ALT: 14 U/L — ABNORMAL LOW (ref 17–63)
AST: 19 U/L (ref 15–41)
Albumin: 3.6 g/dL (ref 3.5–5.0)
Anion gap: 11 (ref 5–15)
BILIRUBIN TOTAL: 1 mg/dL (ref 0.3–1.2)
BUN: 17 mg/dL (ref 6–20)
CALCIUM: 8.8 mg/dL — AB (ref 8.9–10.3)
CO2: 21 mmol/L — ABNORMAL LOW (ref 22–32)
Chloride: 105 mmol/L (ref 101–111)
Creatinine, Ser: 1.44 mg/dL — ABNORMAL HIGH (ref 0.61–1.24)
GFR, EST AFRICAN AMERICAN: 49 mL/min — AB (ref >60)
GFR, EST NON AFRICAN AMERICAN: 42 mL/min — AB (ref >60)
GLUCOSE: 109 mg/dL — AB (ref 65–99)
POTASSIUM: 3.9 mmol/L (ref 3.5–5.1)
Sodium: 137 mmol/L (ref 135–145)
TOTAL PROTEIN: 6.4 g/dL — AB (ref 6.5–8.1)

## 2017-11-01 LAB — PROTIME-INR
INR: 1.07
PROTHROMBIN TIME: 13.8 s (ref 11.4–15.2)

## 2017-11-01 LAB — APTT: aPTT: 36 seconds (ref 24–36)

## 2017-11-01 LAB — PLATELET INHIBITION P2Y12: PLATELET FUNCTION P2Y12: 76 [PRU] — AB (ref 194–418)

## 2017-11-01 IMAGING — XA IR VERTEBRAL  SELECTIVE UNILAT RIGHT (MS)
1 series · 13 of 24 positions shown · IV contrast (IODINE)
Comparison: Diagnostic catheter arteriogram of [DATE].

INDICATION: History of a vertebrobasilar ischemia symptoms. Bilateral
vertebrobasilar severe disease.
CLINICAL DATA: Bilateral significant vertebrobasilar disease with
symptoms presently stable. Right vertebrobasilar junction dominant.

EXAM:
IR ANGIO VERTEBRAL SEL VERTEBRAL UNI RIGHT MOD SED
TECHNIQUE: Informed written consent was obtained from the patient after a
thorough discussion of the procedural risks, benefits and
alternatives. All questions were addressed. Maximal Sterile Barrier
Technique was utilized including caps, mask, sterile gowns, sterile
gloves, sterile drape, hand hygiene and skin antiseptic. A timeout
was performed prior to the initiation of the procedure.

[Series 300: dr. (person_name) · 13 of 67 slices shown]
[im 1/67]
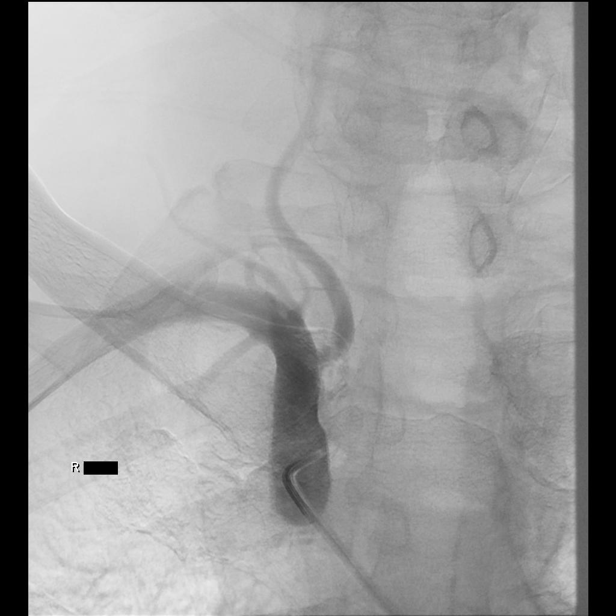
[im 6/67]
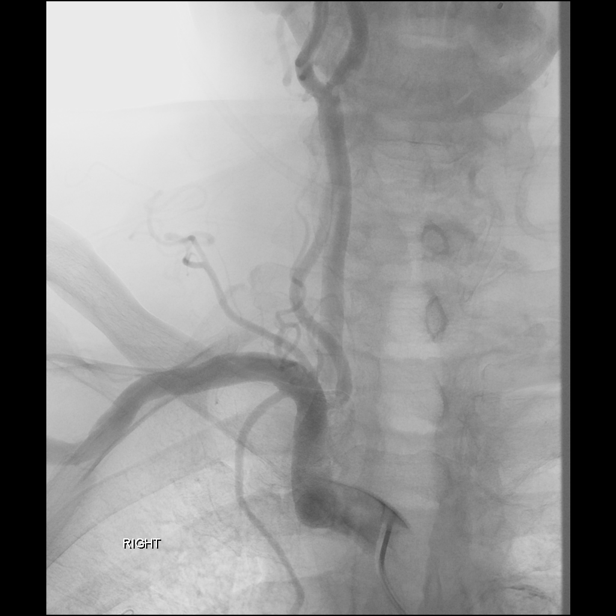
[im 12/67]
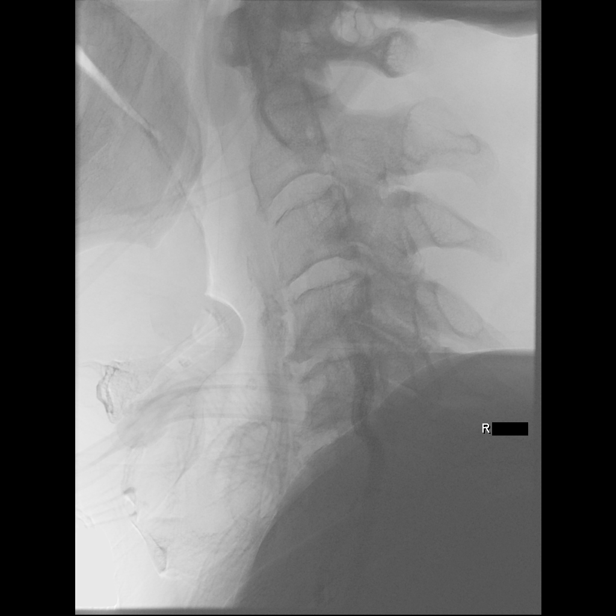
[im 18/67]
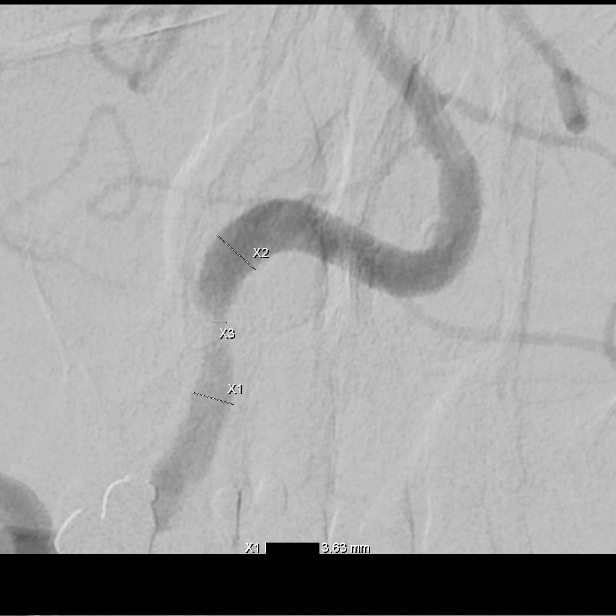
[im 23/67]
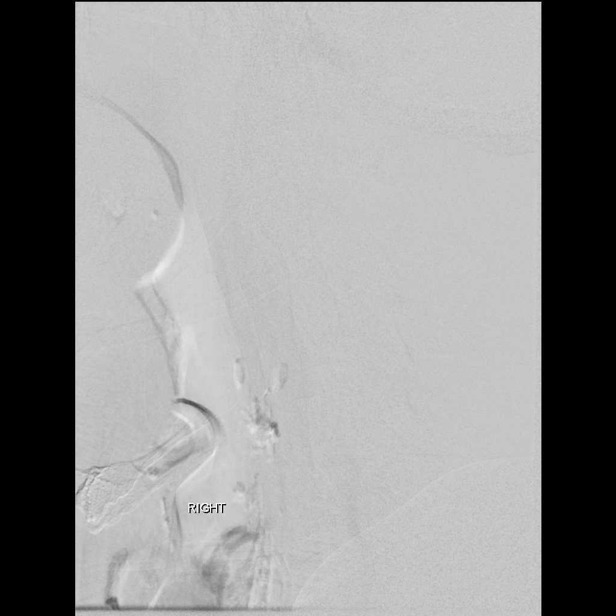
[im 29/67]
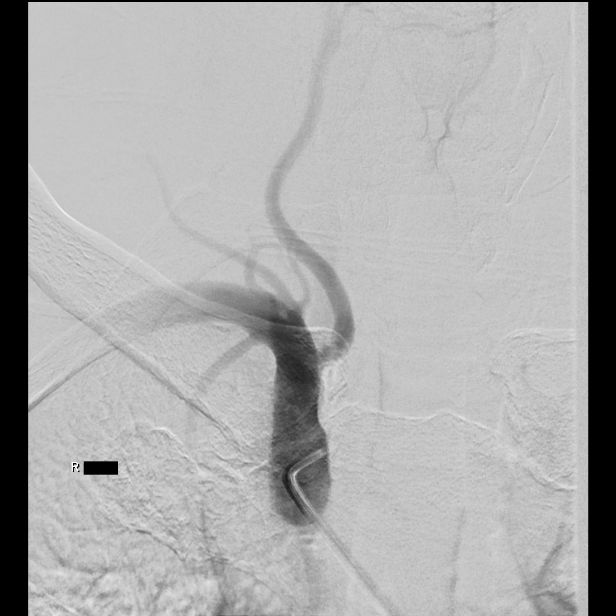
[im 35/67]
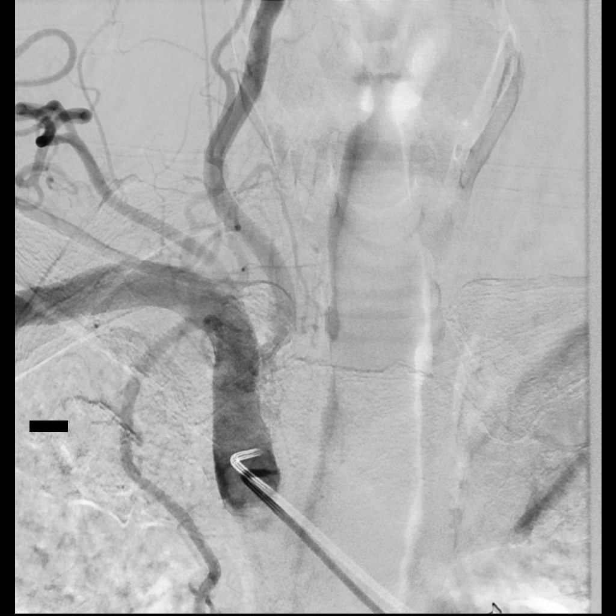
[im 38/67]
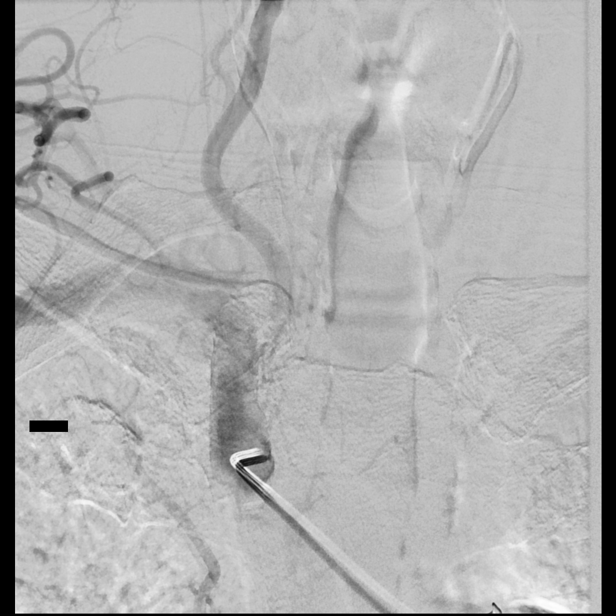
[im 44/67]
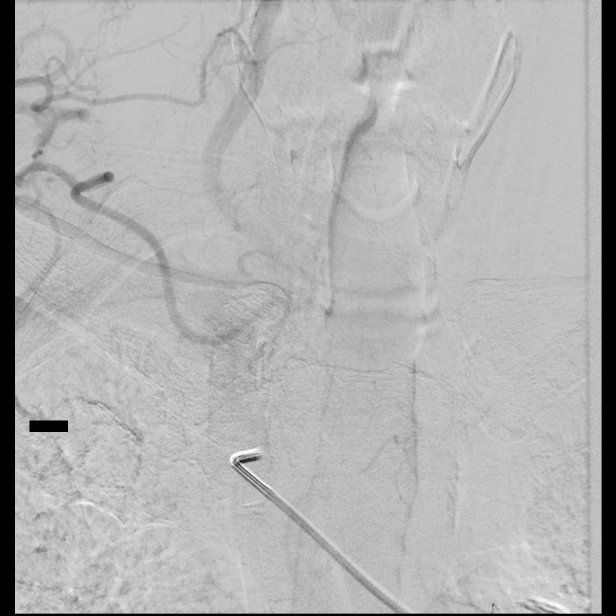
[im 49/67]
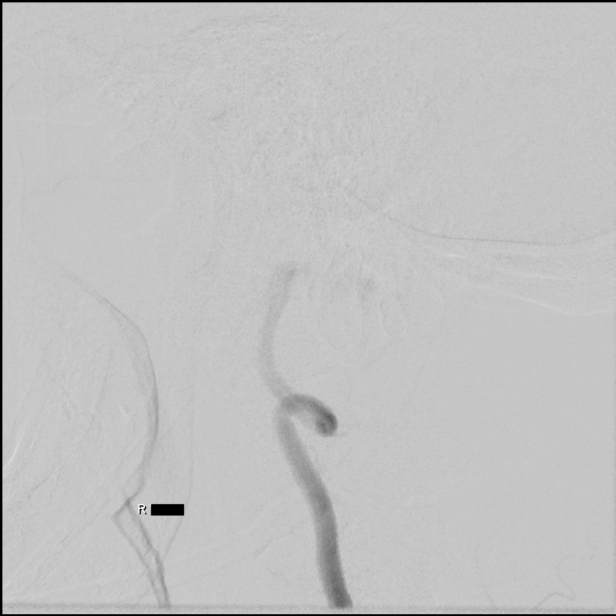
[im 55/67]
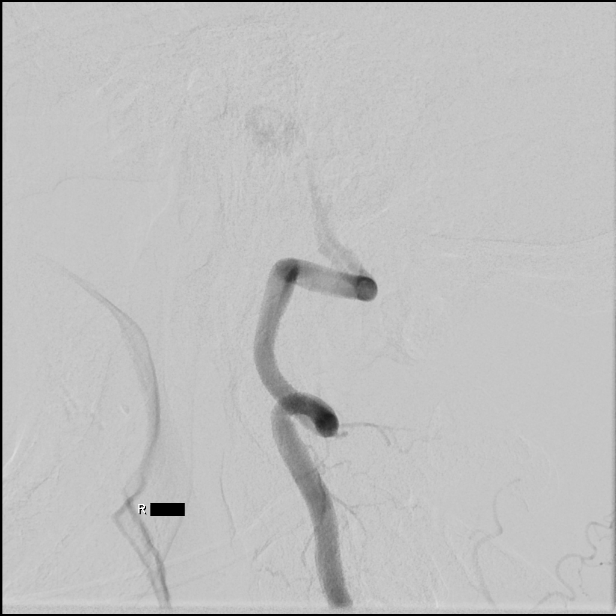
[im 61/67]
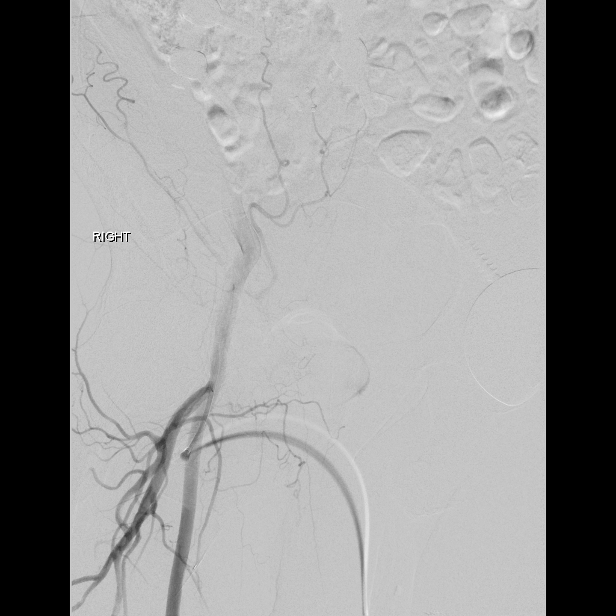
[im 67/67]
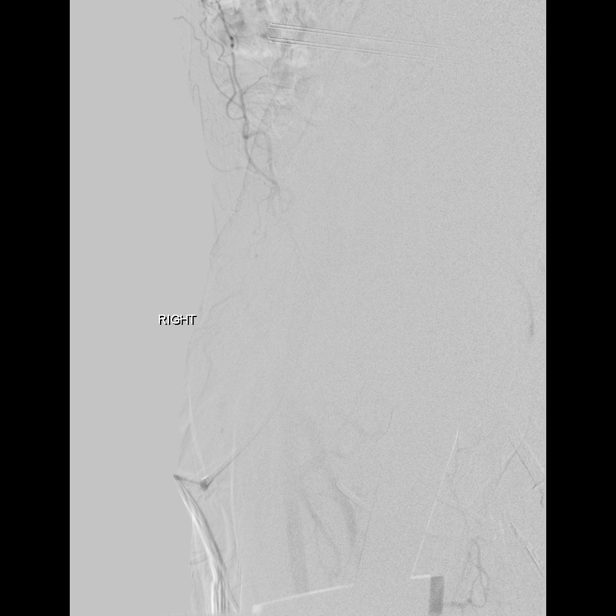

[13 of 24 positions shown; findings below may reference images not displayed]

MEDICATIONS:
Heparin [ZJ] units IV; no antibiotic was administered within 1 hour
of the procedure.

ANESTHESIA/SEDATION:
Versed 1 mg IV; Fentanyl 50 mcg IV.

Mac anesthesia as per the [REDACTED] at [REDACTED].

CONTRAST:  Isovue 300 approximately 25 mL.

FLUOROSCOPY TIME:  Fluoroscopy Time: 9 minutes 18 seconds (467 mGy).

COMPLICATIONS:
None immediate.
The right groin was prepped and draped in the usual sterile fashion.
Thereafter using modified Seldinger technique, transfemoral access
into the right common femoral artery was obtained without
difficulty. Over a 0.035 inch guidewire, a 5 French Pinnacle sheath
was inserted. Through this, and also over 0.035 inch guidewire, a 5
French JB 1 catheter was advanced to the aortic arch region and
selectively positioned in the dominant right vertebral artery.
FINDINGS: The dominant right vertebral artery origin demonstrates mild
stenosis associated with a circumferential calcified plaque.

No acute ulcerations are seen.

The vessel is, otherwise, seen to opacify widely to the cranial
skull base.

Proximal to the right posterior-inferior cerebellar artery, again
demonstrated is a circumferential plaque causing a narrowing of the
vertebrobasilar junction at a site of approximately 67%. More
distally, the distal vertebrobasilar junction, the basilar artery,
the posterior cerebral arteries, the superior cerebellar arteries
and the anterior-inferior cerebellar arteries in their visualized
segments demonstrate wide patency.
IMPRESSION: Approximately 67% stenosis of the dominant right vertebrobasilar
junction just proximal to the right posterior-inferior cerebellar
artery.

PLAN:
Findings of the arteriogram were reviewed with the patient and the
patient's wife and daughter.

Given the patient is asymptomatic on medical management, it was
decided to continue with aggressive medical management with dual
antiplatelets. Should the patient becomes symptomatic with symptoms
such as gait instability, incoordination, visual problems of
blurring blindness or diplopia, swallowing difficulties, perioral
numbness, or limb weakness, they were asked to call [phone]. Otherwise,
an MRI of the brain, and MRA of the brain will be scheduled for 4
months time. The family expressed understanding and agreement with
the above management plan. They were asked to call should they have
any concerns or questions.

## 2017-11-01 IMAGING — DX DG CHEST 1V PORT
1 series · 1 of 1 positions shown · non-contrast
Comparison: Chest radiograph and chest CT [DATE]

CLINICAL DATA: Preoperative evaluation.  History of melanoma

EXAM:
PORTABLE CHEST 1 VIEW

[chest]
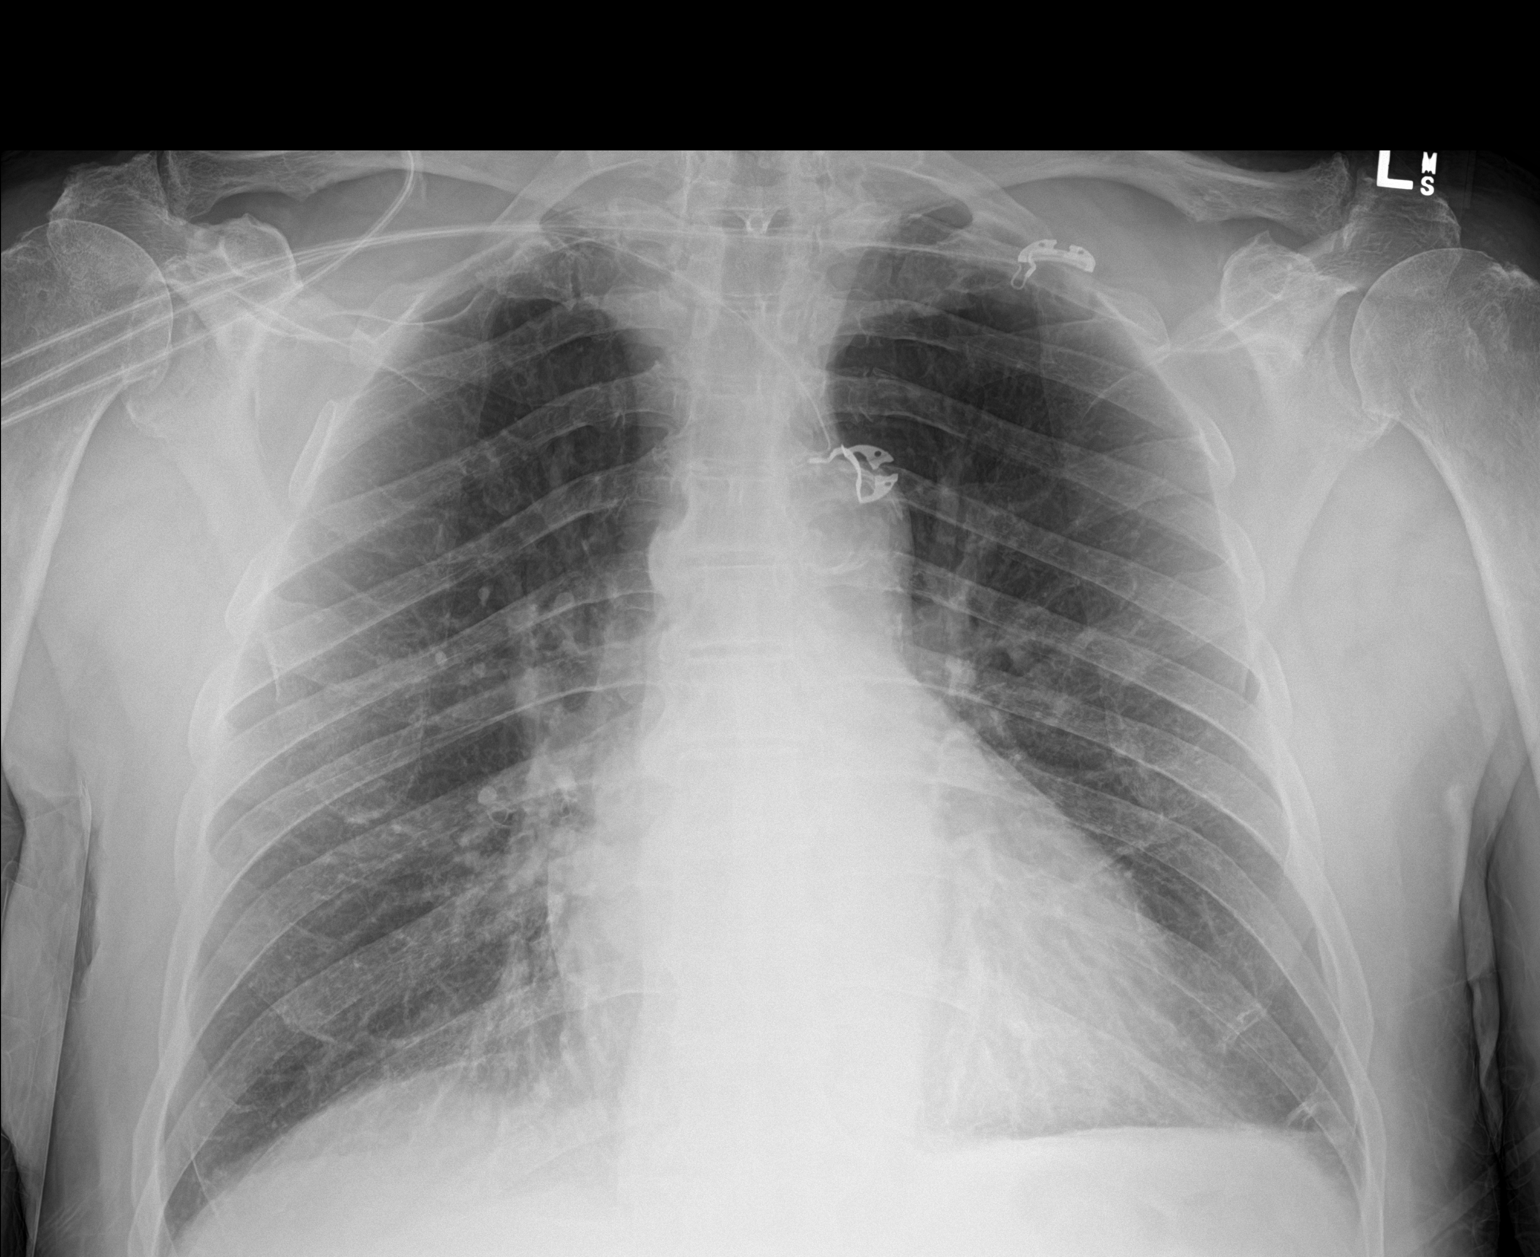

[1 of 1 positions shown; findings below may reference images not displayed]

FINDINGS: There is no edema or consolidation. Heart is borderline enlarged
with pulmonary vascularity within normal limits. There is aortic
atherosclerosis. No adenopathy. There is degenerative change in each
shoulder. No blastic or lytic bone lesions.
IMPRESSION: Aortic atherosclerosis. No edema or consolidation. Heart borderline
enlarged.

Aortic Atherosclerosis ([UV]-[UV]).

## 2017-11-01 SURGERY — IR WITH ANESTHESIA
Anesthesia: Monitor Anesthesia Care

## 2017-11-01 MED ORDER — CEFAZOLIN SODIUM-DEXTROSE 2-4 GM/100ML-% IV SOLN
2.0000 g | INTRAVENOUS | Status: AC
  Administered 2017-11-01: 2 g via INTRAVENOUS

## 2017-11-01 MED ORDER — OXYCODONE HCL 5 MG PO TABS
ORAL_TABLET | ORAL | Status: AC
  Filled 2017-11-01: qty 1

## 2017-11-01 MED ORDER — OXYCODONE HCL 5 MG PO TABS
5.0000 mg | ORAL_TABLET | Freq: Once | ORAL | Status: AC
  Administered 2017-11-01: 5 mg via ORAL

## 2017-11-01 MED ORDER — MIDAZOLAM HCL 2 MG/2ML IJ SOLN: 0.5000 mg | Freq: Once | INTRAMUSCULAR | Status: DC | PRN

## 2017-11-01 MED ORDER — ASPIRIN 81 MG PO CHEW
CHEWABLE_TABLET | ORAL | Status: AC
  Filled 2017-11-01: qty 3

## 2017-11-01 MED ORDER — LIDOCAINE HCL 1 % IJ SOLN
INTRAMUSCULAR | Status: AC
  Filled 2017-11-01: qty 20

## 2017-11-01 MED ORDER — IOPAMIDOL (ISOVUE-300) INJECTION 61%
INTRAVENOUS | Status: AC
  Administered 2017-11-01: 40 mL
  Filled 2017-11-01: qty 150

## 2017-11-01 MED ORDER — FENTANYL CITRATE (PF) 100 MCG/2ML IJ SOLN
INTRAMUSCULAR | Status: DC | PRN
  Administered 2017-11-01: 25 ug via INTRAVENOUS

## 2017-11-01 MED ORDER — MEPERIDINE HCL 25 MG/ML IJ SOLN: 6.2500 mg | INTRAMUSCULAR | Status: DC | PRN

## 2017-11-01 MED ORDER — SODIUM CHLORIDE 0.9 % IV SOLN
INTRAVENOUS | Status: DC
  Administered 2017-11-01: 08:00:00 via INTRAVENOUS

## 2017-11-01 MED ORDER — MIDAZOLAM HCL 5 MG/5ML IJ SOLN
INTRAMUSCULAR | Status: DC | PRN
  Administered 2017-11-01: 1 mg via INTRAVENOUS

## 2017-11-01 MED ORDER — LACTATED RINGERS IV SOLN
INTRAVENOUS | Status: DC | PRN
  Administered 2017-11-01: 08:00:00 via INTRAVENOUS

## 2017-11-01 MED ORDER — LIDOCAINE HCL 1 % IJ SOLN
INTRAMUSCULAR | Status: AC | PRN
  Administered 2017-11-01: 10 mL

## 2017-11-01 MED ORDER — IOPAMIDOL (ISOVUE-300) INJECTION 61%
INTRAVENOUS | Status: AC
  Filled 2017-11-01: qty 150

## 2017-11-01 MED ORDER — PROMETHAZINE HCL 25 MG/ML IJ SOLN: 6.2500 mg | INTRAMUSCULAR | Status: DC | PRN

## 2017-11-01 MED ORDER — HEPARIN SODIUM (PORCINE) 1000 UNIT/ML IJ SOLN
INTRAMUSCULAR | Status: DC | PRN
  Administered 2017-11-01: 1000 [IU] via INTRAVENOUS

## 2017-11-01 MED ORDER — NIMODIPINE 30 MG PO CAPS: 0.0000 mg | ORAL_CAPSULE | ORAL | Status: DC

## 2017-11-01 MED ORDER — ASPIRIN 81 MG PO CHEW: 243.0000 mg | CHEWABLE_TABLET | Freq: Once | ORAL | Status: DC

## 2017-11-01 MED ORDER — CEFAZOLIN SODIUM-DEXTROSE 2-4 GM/100ML-% IV SOLN
INTRAVENOUS | Status: AC
  Filled 2017-11-01: qty 100

## 2017-11-01 MED ORDER — ONDANSETRON HCL 4 MG/2ML IJ SOLN
INTRAMUSCULAR | Status: DC | PRN
  Administered 2017-11-01: 4 mg via INTRAVENOUS

## 2017-11-01 MED ORDER — LABETALOL HCL 5 MG/ML IV SOLN
INTRAVENOUS | Status: DC | PRN
  Administered 2017-11-01 (×2): 2.5 mg via INTRAVENOUS

## 2017-11-01 NOTE — H&P (Signed)
Chief Complaint: vertebrobasiliar artery stenosis (R)  Referring Physician:Dr. Adele Barthel  Supervising Physician: Luanne Bras  Patient Status: [facility] - Out-pt  HPI: Scott [name] is a 84 y.o. male who was being evaluated for dizziness and gait disturbance who was thought to have some carotid artery stenosis.  After work up it was found that his right vertebrobasilar artery had stenosis.  He underwent an angiogram which confirmed high-grade stenosis.  He was started on plavix; however, did not respond to this and his first planned intervention was cancelled due to an elevated p2y12.  He was changed to [place] and presents today for intervention.  [name] is 76 today.  He has no other complaints except some chronic mild SOB likely related to his A fib.    Past Medical History:  Past Medical History:  Diagnosis Date  . Anxiety 10/31/2014   due to upcoming procedure  . Arrhythmia   . Arthritis   . Cancer ([place])    Melonoma on nose- removed  . Chronic kidney disease   . Dyspnea    with exertion; chronic.  "One night a week I get short of breath during the night- I talk to Dr Raliegh Ip on at last visit- 10/26/16  . Dysrhythmia    afib  . Enlarged prostate   . GERD (gastroesophageal reflux disease)   . Headache   . Hyperlipidemia   . Hypertension   . Hypothyroidism   . Overactive bladder   . Stroke [facility])    TIA - "nothing taste right anymore, right eye pain  . TIA (transient ischemic attack)   . Wears glasses     Past Surgical History:  Past Surgical History:  Procedure Laterality Date  . BACK SURGERY  08/2013  . CATARACT EXTRACTION    . COLONOSCOPY    . EYE SURGERY Bilateral    cataract  . IR ANGIO INTRA EXTRACRAN SEL COM CAROTID INNOMINATE BILAT MOD SED  08/04/2017  . IR ANGIO VERTEBRAL SEL VERTEBRAL BILAT MOD SED  08/04/2017  . IR RADIOLOGIST EVAL & MGMT  07/28/2017  . IR RADIOLOGIST EVAL & MGMT  08/22/2017  . JOINT REPLACEMENT Left 10/2015   hip  . KNEE  ARTHROPLASTY Left 1999  . KNEE ARTHROPLASTY Right 2008  . LUMBAR FUSION  06/2014  . TRANSURETHRAL RESECTION OF PROSTATE  03/2015  . TRIGGER FINGER RELEASE Right    3rd and 4th    Family History:  Family History  Problem Relation Age of Onset  . Coronary artery disease Mother     Social History:  reports that he quit smoking about 39 years ago. His smoking use included cigarettes. He started smoking about 52 years ago. He quit after 40.00 years of use. he has never used smokeless tobacco. He reports that he does not drink alcohol or use drugs.  Allergies:  Allergies  Allergen Reactions  . Tramadol Nausea Only    Medications: Medications reviewed in epic  Please HPI for pertinent positives, otherwise complete 10 system ROS negative.  Mallampati Score: MD Evaluation Airway: WNL Heart: Other (comments) Heart  comments: a fib Abdomen: WNL Chest/ Lungs: WNL ASA  Classification: 3  Physical Exam: BP (!) 110/56   Pulse 91   Temp 97.8 F (36.6 C) (Oral)   Resp 19   SpO2 99%  There is no height or weight on file to calculate BMI. General: pleasant, WD, WN white male who is laying in bed in NAD HEENT: head is normocephalic, atraumatic.  Sclera  are noninjected.  PERRL.  Ears and nose without any masses or lesions.  Mouth is pink and moist Heart: irregularly irregular.  Normal s1,s2. No obvious murmurs, gallops, or rubs noted.  Palpable radial and pedal pulses bilaterally.  +1 pedal pulses, left foot difficult to find and quite faint Lungs: CTAB, no wheezes, rhonchi, or rales noted.  Respiratory effort nonlabored Abd: soft, NT, ND, +BS, no masses, hernias, or organomegaly Neuro: intact with no abnormalities Psych: A&Ox3 with an appropriate affect.   Labs: Results for orders placed or performed during the hospital encounter of 11/01/17 (from the past 48 hour(s))  APTT     Status: None   Collection Time: 11/01/17  6:22 AM  Result Value Ref Range   aPTT 36 24 - 36 seconds    CBC WITH DIFFERENTIAL     Status: Abnormal   Collection Time: 11/01/17  6:22 AM  Result Value Ref Range   WBC 6.6 4.0 - 10.5 K/uL   RBC 4.29 4.22 - 5.81 MIL/uL   Hemoglobin 12.2 (L) 13.0 - 17.0 g/dL   HCT 38.0 (L) 39.0 - 52.0 %   MCV 88.6 78.0 - 100.0 fL   MCH 28.4 26.0 - 34.0 pg   MCHC 32.1 30.0 - 36.0 g/dL   RDW 15.2 11.5 - 15.5 %   Platelets 226 150 - 400 K/uL   Neutrophils Relative % 63 %   Neutro Abs 4.2 1.7 - 7.7 K/uL   Lymphocytes Relative 20 %   Lymphs Abs 1.3 0.7 - 4.0 K/uL   Monocytes Relative 12 %   Monocytes Absolute 0.8 0.1 - 1.0 K/uL   Eosinophils Relative 4 %   Eosinophils Absolute 0.3 0.0 - 0.7 K/uL   Basophils Relative 1 %   Basophils Absolute 0.0 0.0 - 0.1 K/uL  Comprehensive metabolic panel     Status: Abnormal   Collection Time: 11/01/17  6:22 AM  Result Value Ref Range   Sodium 137 135 - 145 mmol/L   Potassium 3.9 3.5 - 5.1 mmol/L   Chloride 105 101 - 111 mmol/L   CO2 21 (L) 22 - 32 mmol/L   Glucose, Bld 109 (H) 65 - 99 mg/dL   BUN 17 6 - 20 mg/dL   Creatinine, Ser 1.44 (H) 0.61 - 1.24 mg/dL   Calcium 8.8 (L) 8.9 - 10.3 mg/dL   Total Protein 6.4 (L) 6.5 - 8.1 g/dL   Albumin 3.6 3.5 - 5.0 g/dL   AST 19 15 - 41 U/L   ALT 14 (L) 17 - 63 U/L   Alkaline Phosphatase 57 38 - 126 U/L   Total Bilirubin 1.0 0.3 - 1.2 mg/dL   GFR calc non Af Amer 42 (L) >60 mL/min   GFR calc Af Amer 49 (L) >60 mL/min    Comment: (NOTE) The eGFR has been calculated using the CKD EPI equation. This calculation has not been validated in all clinical situations. eGFR's persistently <60 mL/min signify possible Chronic Kidney Disease.    Anion gap 11 5 - 15  Platelet inhibition p2y12 (not at [facility])     Status: Abnormal   Collection Time: 11/01/17  6:22 AM  Result Value Ref Range   Platelet Function  P2Y12 76 (L) 194 - 418 PRU    Comment:        The literature has shown a direct correlation of PRU values over 230 with higher risks of thrombotic events.  Lower PRU values  are associated with platelet inhibition.   Protime-INR  Status: None   Collection Time: 11/01/17  6:22 AM  Result Value Ref Range   Prothrombin Time 13.8 11.4 - 15.2 seconds   INR 1.07     Imaging: No results found.  Assessment/Plan 1. High grade right vertebrobasilar artery stenosis  Plan to proceed today with angiogram and then intervention with angioplasty, +/- stenting if needed.  His labs and vitals have been reviewed.  He has some chronic kidney disease.  He will have fluids running preop, during, and postop to help filter contrast.  The patient understands the procedure planned and has no questions.  If intervention occurs, he will be admitted overnight for observation with plans for DC home in the am. Risks and benefits of cerebral angiogram with intervention were discussed with the patient including, but not limited to bleeding, infection, vascular injury or contrast induced renal failure.  This interventional procedure involves the use of X-rays and because of the nature of the planned procedure, it is possible that we will have prolonged use of X-ray fluoroscopy.  Potential radiation risks to you include (but are not limited to) the following: - A slightly elevated risk for cancer  several years later in life. This risk is typically less than 0.5% percent. This risk is low in comparison to the normal incidence of human cancer, which is 33% for women and 50% for men according to the [place]. - Radiation induced injury can include skin redness, resembling a rash, tissue breakdown / ulcers and hair loss (which can be temporary or permanent).   The likelihood of either of these occurring depends on the difficulty of the procedure and whether you are sensitive to radiation due to previous procedures, disease, or genetic conditions.   IF your procedure requires a prolonged use of radiation, you will be notified and given written instructions for further action.   It is your responsibility to monitor the irradiated area for the 2 weeks following the procedure and to notify your physician if you are concerned that you have suffered a radiation induced injury.    All of the patient's questions were answered, patient is agreeable to proceed.  Consent signed and in chart.   Thank you for this interesting consult.  I greatly enjoyed meeting Scott [name] and look forward to participating in their care.  A copy of this report was sent to the requesting provider on this date.  Electronically Signed: Henreitta Cea 11/01/2017, 8:36 AM   I spent a total of    25 Minutes in face to face in clinical consultation, greater than 50% of which was counseling/coordinating care for right vertebrobasilar artery stenosis

## 2017-11-01 NOTE — Progress Notes (Signed)
Patient ID: Scott NIDIFFER, male   DOB: 27-Jul-1928, 84 y.o.   MRN: [id number] INR. 84 yr old M Rt handed scheduled for possible endovascular treatment of RT VBJ stenosis as per arteriogram of 08/04/17. Patient was admitted at [facility]  On 08/25/17 with rapid AF and evaluated by cardiology. Responded to IV diltiazem.PE and MI were ruled out..  Presently  denies any chest pains,SOB at rest  ,palpitations,or PND.Denies coughing  up frothy sputum or of ankle edema. Denies chest pains or hemoptysis . Nom recent fever,chills or rigors. On questioning says lately has had to use 2 pillows instead of his usual on e to sleep because of difficulty breathing.  Neurologically remains asymmptomatic . Labs. Creatinine 1.44 . P2 Y 12 76. O/E In no acute distress. Neurologically intact without focal deficits.  Plan. 1.CXR . 2.Diagnostic arteriogram with possible intervention.  Discussed plan extensively with patient and spouse. Explained to them the procedure risks , from neuro  and cardiac standpoint,including risk of stroke ,ICH and congestive cardiac failure ,cardiac arrest,worsening kidney function and kidney failure, and death. Questions answered to their satisfaction. In formed witnessed consent obtained. S.Willett Lefeber MD

## 2017-11-01 NOTE — Anesthesia Procedure Notes (Signed)
Performed by: Izora Gala, CRNA

## 2017-11-01 NOTE — Transfer of Care (Signed)
Immediate Anesthesia Transfer of Care Note  Patient: Scott [name]  Procedure(s) Performed: STENTING (N/A )  Patient Location: PACU  Anesthesia Type:MAC  Level of Consciousness: awake, alert , oriented and patient cooperative  Airway & Oxygen Therapy: Patient Spontanous Breathing and Patient connected to nasal cannula oxygen  Post-op Assessment: Report given to RN  Post vital signs: Reviewed and stable  Last Vitals:  Vitals:   11/01/17 0643 11/01/17 1100  BP: (!) 110/56   Pulse: 91   Resp: 19   Temp: 36.6 C (P) 36.5 C  SpO2: 99%     Last Pain:  Vitals:   11/01/17 0723  TempSrc:   PainSc: 3       Patients Stated Pain Goal: 5 (93/23/55 7322)  Complications: No apparent anesthesia complications

## 2017-11-01 NOTE — Anesthesia Postprocedure Evaluation (Signed)
Anesthesia Post Note  Patient: Scott [name]  Procedure(s) Performed: STENTING (N/A )     Patient location during evaluation: PACU Anesthesia Type: MAC Level of consciousness: awake and alert, oriented and patient cooperative Pain management: pain level controlled Vital Signs Assessment: post-procedure vital signs reviewed and stable Respiratory status: spontaneous breathing, nonlabored ventilation, respiratory function stable and patient connected to nasal cannula oxygen Cardiovascular status: blood pressure returned to baseline and stable Postop Assessment: no apparent nausea or vomiting Anesthetic complications: no    Last Vitals:  Vitals:   11/01/17 1412 11/01/17 1415  BP: (!) 156/100   Pulse: 93 92  Resp: 20 (!) 21  Temp:    SpO2: 98% 98%    Last Pain:  Vitals:   11/01/17 1200  TempSrc:   PainSc: 0-No pain                 Chayah Mckee,E. Solstice Lastinger

## 2017-11-01 NOTE — Anesthesia Procedure Notes (Signed)
Arterial Line Insertion Start/End1/16/2019 9:00 AM, 11/01/2017 9:10 AM Performed by: CRNA  Preanesthetic checklist: patient identified, IV checked, surgical consent and monitors and equipment checked Left, radial was placed Maximum sterile barriers used   Attempts: 2 Procedure performed without using ultrasound guided technique. Ultrasound Notes:anatomy identified Following insertion, dressing applied and Biopatch. Post procedure assessment: unchanged  Patient tolerated the procedure well with no immediate complications.

## 2017-11-01 NOTE — Procedures (Signed)
S/P RT [facility] arteriogram. RT CFA approach. Findings. 1.Approx 67 % stenosis at RT VBJ

## 2017-11-02 ENCOUNTER — Telehealth: Payer: Self-pay | Admitting: *Deleted

## 2017-11-02 ENCOUNTER — Encounter (HOSPITAL_COMMUNITY): Payer: Self-pay | Admitting: Interventional Radiology

## 2017-11-02 NOTE — Telephone Encounter (Signed)
It depends on whether or not he wants him to remain on ASA and Brilinta. If not, I will start Eliquis.

## 2017-11-02 NOTE — Telephone Encounter (Signed)
Stated that he had procedure done yesterday, but did not get stents.  Was told to get back in touch with cardiologist to discuss his atrial fibrillation.  Was too risky to put patient to sleep under his current condition.  Notes in Epic, please review.  Did not see anything pertinent to what he is questioning.  Was not started on anticoagulation by Dr. Estanislado Pandy.

## 2017-11-03 ENCOUNTER — Encounter: Payer: Self-pay | Admitting: *Deleted

## 2017-11-03 ENCOUNTER — Encounter (HOSPITAL_COMMUNITY): Payer: Self-pay | Admitting: Interventional Radiology

## 2017-11-03 NOTE — Telephone Encounter (Signed)
Patient walked in asked that he be called back on his cell

## 2017-11-03 NOTE — Telephone Encounter (Signed)
Patient notified.  Stated that [place] did not tell him to stop ASA or Brilinta.  Patient is unclear at to what he needs to do next.  Will fax note to [place] office to see if we can get more info.

## 2017-11-07 NOTE — Telephone Encounter (Signed)
Mr. Monnier called to see if we have received any records from Dr. Estanislado Pandy office.  Requested to please call him on his cell phone  [phone] when we receive notes.

## 2017-11-08 DIAGNOSIS — M48061 Spinal stenosis, lumbar region without neurogenic claudication: Secondary | ICD-10-CM | POA: Diagnosis not present

## 2017-11-08 DIAGNOSIS — M47816 Spondylosis without myelopathy or radiculopathy, lumbar region: Secondary | ICD-10-CM | POA: Diagnosis not present

## 2017-11-08 NOTE — Telephone Encounter (Signed)
Call placed to Dr. Estanislado Pandy - spoke with admin asst [facility] Candy) - she will relay message to appropriate staff.

## 2017-11-09 NOTE — Telephone Encounter (Signed)
Patient made aware that we are waiting on reply.  Will let him know as soon as we hear back from them.

## 2017-11-14 ENCOUNTER — Other Ambulatory Visit (HOSPITAL_COMMUNITY): Payer: Self-pay | Admitting: Interventional Radiology

## 2017-11-14 ENCOUNTER — Telehealth (HOSPITAL_COMMUNITY): Payer: Self-pay | Admitting: Radiology

## 2017-11-14 DIAGNOSIS — I771 Stricture of artery: Secondary | ICD-10-CM

## 2017-11-14 NOTE — Telephone Encounter (Signed)
Patient walked in asking if we had received information from Dr Arlean Hopping office.   Asked that Scott [name] call him back today

## 2017-11-14 NOTE — Telephone Encounter (Signed)
Anderson Malta called back to let gayle know that she has routed note to Dr Bronson Ing

## 2017-11-14 NOTE — Telephone Encounter (Signed)
Called Edd Fabian at Dr. Court Joy office. Spoke with [employer]. Let the office know that we have routed the last office note from Dr. Estanislado Pandy to Dr. Bronson Ing with the recent instruction on the patient's anticoagulation therapy. JM

## 2017-11-14 NOTE — Telephone Encounter (Signed)
Pt aware and voiced understanding     Scott [name], please have the patient continue only ASA and Brilinta (not Eliquis).   Dr. Bronson Ing

## 2017-11-14 NOTE — Telephone Encounter (Signed)
Spoke with Anderson Malta with Dr. Arlean Hopping office - working on this, will call back after discussing with the provider.    Patient made aware that contact was made with Deveshwar's office.  Waiting on reply.

## 2017-11-20 ENCOUNTER — Telehealth: Payer: Self-pay | Admitting: Cardiovascular Disease

## 2017-11-20 NOTE — Telephone Encounter (Signed)
Next APP is not until 2/12 and pt doesn't want to wait that long - can we add to 940 slot if available ?

## 2017-11-20 NOTE — Telephone Encounter (Signed)
Have him see APP, please.

## 2017-11-20 NOTE — Telephone Encounter (Signed)
Patient was thinking he is suppose to see Dr Bronson Ing sooner than April per information Dr Estanislado Pandy gave him

## 2017-11-20 NOTE — Telephone Encounter (Signed)
No avalaible slot with Dr Bronson Ing until 2/12 - pt scheduled with APP Friday at [place] office 8am - pt made aware

## 2017-11-20 NOTE — Telephone Encounter (Signed)
Pt still c/o SOB and wants to be seen prior to seeing Dr Estanislado Pandy on 2/12. Next available for Dr Bronson Ing in [place] or [place] is 3/5 - routed to provider

## 2017-11-22 NOTE — Progress Notes (Signed)
Cardiology Office Note   Date:  11/24/2017   ID:  Scott [name], DOB 12-04-1927, MRN [id number]  PCP:  Curlene Labrum, MD  Cardiologist:  Dr. Jacinta Shoe     Chief Complaint  Patient presents with  . Atrial Fibrillation    DOE      History of Present Illness: Scott [name] is a 84 y.o. male who presents for atrial fib and eval for further treatment.    Echocardiogram performed at [facility] on 06/22/17 demonstrated normal left ventricular systolic and diastolic function, LVEF 35-32%, moderate concentric LVH, and mild left atrial dilatation.  He was scheduled for neurovascular intervention on 11/01/17 for 89% stenosis of the right vertebrobasilar junction [name] was elevated so plavix was changed to brilinta.  The procedure was cancelled.   He is currently taking diltiazem 60 mg 2times daily and extra 30 mg if needed in evening.  Also labetalol 200 mg BID.   [name] has improved.    For the most part his heart rate is in the 70-80 bpm range but he does have episodes of DOE and at night some SOB.    Dr. Estanislado Pandy did not wish him on anticoagulation as of yet.  Pt is to see him next week..  With continued SOB will recheck echo.  Pt believes this is to arrange for vertebral intervention.   PT was told he would need to see Dr. Jacinta Shoe prior to intervention.     Past Medical History:  Diagnosis Date  . Anxiety 10/31/2014   due to upcoming procedure  . Arrhythmia   . Arthritis   . Cancer ([place])    Melonoma on nose- removed  . Chronic kidney disease   . Dyspnea    with exertion; chronic.  "One night a week I get short of breath during the night- I talk to Dr Raliegh Ip on at last visit- 10/26/16  . Dysrhythmia    afib  . Enlarged prostate   . GERD (gastroesophageal reflux disease)   . Headache   . Hyperlipidemia   . Hypertension   . Hypothyroidism   . Overactive bladder   . Stroke [facility])    TIA - "nothing taste right anymore, right eye pain  . TIA  (transient ischemic attack)   . Wears glasses     Past Surgical History:  Procedure Laterality Date  . BACK SURGERY  08/2013  . CATARACT EXTRACTION    . COLONOSCOPY    . EYE SURGERY Bilateral    cataract  . IR ANGIO INTRA EXTRACRAN SEL COM CAROTID INNOMINATE BILAT MOD SED  08/04/2017  . IR ANGIO VERTEBRAL SEL VERTEBRAL BILAT MOD SED  08/04/2017  . IR ANGIO VERTEBRAL SEL VERTEBRAL UNI R MOD SED  11/01/2017  . IR RADIOLOGIST EVAL & MGMT  07/28/2017  . IR RADIOLOGIST EVAL & MGMT  08/22/2017  . JOINT REPLACEMENT Left 10/2015   hip  . KNEE ARTHROPLASTY Left 1999  . KNEE ARTHROPLASTY Right 2008  . LUMBAR FUSION  06/2014  . RADIOLOGY WITH ANESTHESIA N/A 11/01/2017   Procedure: STENTING;  Surgeon: Luanne Bras, MD;  Location: [place];  Service: Radiology;  Laterality: N/A;  . TRANSURETHRAL RESECTION OF PROSTATE  03/2015  . TRIGGER FINGER RELEASE Right    3rd and 4th     Current Outpatient Medications  Medication Sig Dispense Refill  . acetaminophen (TYLENOL) 500 MG tablet Take 1,000 mg by mouth 2 (two) times daily.     Marland Kitchen aspirin EC 81  MG tablet Take 81 mg by mouth daily.    Marland Kitchen atorvastatin (LIPITOR) 10 MG tablet Take 10 mg by mouth every evening.     . Carboxymethylcellul-Glycerin (LUBRICATING EYE DROPS OP) Place 1-2 drops into both eyes 2 (two) times daily as needed (for dry eyes).     Marland Kitchen diltiazem (CARDIZEM) 60 MG tablet Take 1 tablet (60 mg total) by mouth 2 (two) times daily. (May take an extra 30mg  in the evening as needed)  Med increased 10/26/2017.   PLACE ON FILE 75 tablet 6  . doxazosin (CARDURA) 8 MG tablet Take 8 mg by mouth at bedtime.    . fluticasone (FLONASE) 50 MCG/ACT nasal spray Place 2 sprays into both nostrils daily as needed for allergies or rhinitis.     Marland Kitchen labetalol (NORMODYNE) 200 MG tablet Take 200 mg by mouth 2 (two) times daily.    Marland Kitchen levothyroxine (SYNTHROID, LEVOTHROID) 150 MCG tablet Take 150 mcg daily before breakfast by mouth.    Marland Kitchen lisinopril  (PRINIVIL,ZESTRIL) 40 MG tablet Take 40 mg by mouth daily.    Marland Kitchen omeprazole (PRILOSEC) 20 MG capsule Take 20 mg by mouth daily.    Marland Kitchen oxyCODONE (OXY IR/ROXICODONE) 5 MG immediate release tablet Take 5 mg 2 (two) times daily as needed by mouth for severe pain.    . ticagrelor (BRILINTA) 90 MG TABS tablet Take 90 mg by mouth 2 (two) times daily.      No current facility-administered medications for this visit.    Facility-Administered Medications Ordered in Other Visits  Medication Dose Route Frequency Provider Last Rate Last Dose  . clopidogrel (PLAVIX) tablet 75 mg  75 mg Oral 60 min Pre-Op Monia Sabal, PA-C        Allergies:   Tramadol    Social History:  The patient  reports that he quit smoking about 39 years ago. His smoking use included cigarettes. He started smoking about 52 years ago. He quit after 40.00 years of use. he has never used smokeless tobacco. He reports that he does not drink alcohol or use drugs.   Family History:  The patient's family history includes Coronary artery disease in his mother.    ROS:  General:no colds or fevers, no weight changes Skin:no rashes or ulcers HEENT:no blurred vision, no congestion CV:see HPI PUL:see HPI GI:no diarrhea constipation or melena, no indigestion GU:no hematuria, no dysuria MS:no joint pain, no claudication Neuro:no syncope, no lightheadedness Endo:no diabetes, + thyroid disease  Wt Readings from Last 3 Encounters:  11/24/17 182 lb 12.8 oz (82.9 kg)  10/26/17 184 lb (83.5 kg)  09/15/17 190 lb (86.2 kg)     PHYSICAL EXAM: VS:  BP (!) 160/90 (BP Location: Left Arm, Patient Position: Sitting, Cuff Size: Normal)   Pulse 78   Ht 5\' 6"  (1.676 m)   Wt 182 lb 12.8 oz (82.9 kg)   BMI 29.50 kg/m  , BMI Body mass index is 29.5 kg/m. General:Pleasant affect, NAD Skin:Warm and dry, brisk capillary refill HEENT:normocephalic, sclera clear, mucus membranes moist Neck:supple, no JVD, no bruits  Heart:irreg irreg without  murmur, gallup, rub or click Lungs:clear without rales, rhonchi, or wheezes DXI:PJAS, non tender, + BS, do not palpate liver spleen or masses Ext:no lower ext edema, 2+ pedal pulses, 2+ radial pulses Neuro:alert and oriented X 3, MAE, follows commands, + facial symmetry  Today with ambulation HR 113 and SP02 is 95%   EKG:  EKG is ordered today. The ekg ordered today demonstrates atrial fib rate controlled  at 1, no acute changes from previous.    Recent Labs: 11/01/2017: ALT 14; BUN 17; Creatinine, Ser 1.44; Hemoglobin 12.2; Platelets 226; Potassium 3.9; Sodium 137    Lipid Panel No results found for: CHOL, TRIG, HDL, CHOLHDL, VLDL, LDLCALC, LDLDIRECT     Other studies Reviewed: Additional studies/ records that were reviewed today include:  Echo 06/22/17 at [facility].  Moderate concentric LVH, EF 60-65%, LA mildly dilated, RV sytolic function is normal. AV leavlet mildly thickened.   ASSESSMENT AND PLAN:  1.  Pre-op will recheck echo since a fib.  eval EF this should help guide volume with procedure. But he is stable for intervention.  Today HR 115 with walking and SP02 at 95%   He has no chest pain.  No further ischemic workup needed.    2.  A fib rate controlled most likely DOE is related to a fib, but it is not severe at this time.  He is on ASA and Brilinta now in anticipation of IR procedure.  Once this is completed we would when Dr. Estanislado Pandy states it is safe to change to Eliquis alone.    3.  HTN - his BP is running higher with new meds.  Though at times down to 259 systolic.   He tells me he take an extra labetolol when BP is elevated.    We discussed increasing to 300 mg BID, will check with Dr. Jacinta Shoe with the drops he has at times.    4.  Right vertebrobasilar junction stenosis of 89%: Dr. Sandi Raveling to the timing of initiating an anticoagulant such as Eliquis or Xarelto for atrial fibrillation given the need for dual antiplatelet therapy.  Current medicines are  reviewed with the patient today.  The patient Has no concerns regarding medicines.  The following changes have been made:  See above Labs/ tests ordered today include:see above  Disposition:   FU:  see above  Signed, Cecilie Kicks, NP  11/24/2017 9:59 AM    [address], [place], [place] [address], [place] Phone: [phone]; Fax: [phone]

## 2017-11-24 ENCOUNTER — Ambulatory Visit (INDEPENDENT_AMBULATORY_CARE_PROVIDER_SITE_OTHER): Payer: Medicare Other | Admitting: Cardiology

## 2017-11-24 ENCOUNTER — Encounter: Payer: Self-pay | Admitting: Cardiology

## 2017-11-24 VITALS — BP 160/90 | HR 78 | Ht 66.0 in | Wt 182.8 lb

## 2017-11-24 DIAGNOSIS — Z01818 Encounter for other preprocedural examination: Secondary | ICD-10-CM

## 2017-11-24 DIAGNOSIS — I10 Essential (primary) hypertension: Secondary | ICD-10-CM | POA: Diagnosis not present

## 2017-11-24 DIAGNOSIS — R0602 Shortness of breath: Secondary | ICD-10-CM

## 2017-11-24 DIAGNOSIS — I651 Occlusion and stenosis of basilar artery: Secondary | ICD-10-CM

## 2017-11-24 DIAGNOSIS — I6509 Occlusion and stenosis of unspecified vertebral artery: Secondary | ICD-10-CM | POA: Diagnosis not present

## 2017-11-24 DIAGNOSIS — I4891 Unspecified atrial fibrillation: Secondary | ICD-10-CM

## 2017-11-24 NOTE — Patient Instructions (Addendum)
Medication Instructions:  Your physician recommends that you continue on your current medications as directed. Please refer to the Current Medication list given to you today.   Labwork: None ordered  Testing/Procedures: Your physician has requested that you have an echocardiogram in 1 week in either [place] or [place]. Echocardiography is a painless test that uses sound waves to create images of your heart. It provides your doctor with information about the size and shape of your heart and how well your heart's chambers and valves are working. This procedure takes approximately one hour. There are no restrictions for this procedure.    Follow-Up: Your physician recommends that you schedule a follow-up appointment in: MARCH with Dr. Bronson Ing.    Any Other Special Instructions Will Be Listed Below (If Applicable).     If you need a refill on your cardiac medications before your next appointment, please call your pharmacy.

## 2017-11-27 ENCOUNTER — Telehealth: Payer: Self-pay | Admitting: *Deleted

## 2017-11-27 NOTE — Telephone Encounter (Signed)
Left message for pt to call back re: increasing his Labetolol.

## 2017-11-27 NOTE — Telephone Encounter (Signed)
-----   Message from Isaiah Serge, NP sent at 11/27/2017 10:35 AM EST ----- Let pt know I checked with Dr. Jacinta Shoe and ok to increase labetolol to 300 mg BID from 200 BID.  To control BP  Thanks.   ----- Message ----- From: Herminio Commons, MD Sent: 11/27/2017  10:21 AM To: Isaiah Serge, NP  That would be fine.  ----- Message ----- From: Isaiah Serge, NP Sent: 11/24/2017   1:40 PM To: Herminio Commons, MD  I saw pt today, his BP is still elevated at times, other times he is 156F systolic.  Should I increase his labetolol to 300 BID, pt was anxious so I told him I would defer to you.  Thanks.

## 2017-11-28 MED ORDER — LABETALOL HCL 200 MG PO TABS: 300.0000 mg | ORAL_TABLET | Freq: Two times a day (BID) | ORAL | 0 refills | Status: DC

## 2017-11-28 NOTE — Telephone Encounter (Signed)
Follow up   Patient returning call . Please call on mobile

## 2017-11-28 NOTE — Telephone Encounter (Signed)
Returned call to pt.  He has been made aware to increase his Labetalol to 300 mg bid (200 mg 1 1/2 tablet bid) Pt states that he gets his rx from the [place] and that he was sure he had enough to increase it until his appt in March with Dr. Bronson Ing, but if pt seen that he needed a rx, he would call the [place] and find out what the needs to do.  Pt thanked me for the call.

## 2017-11-29 ENCOUNTER — Ambulatory Visit (HOSPITAL_COMMUNITY)
Admission: RE | Admit: 2017-11-29 | Discharge: 2017-11-29 | Disposition: A | Discharge status: Home / Self Care | Payer: Medicare Other | Source: Ambulatory Visit | Attending: Interventional Radiology | Admitting: Interventional Radiology

## 2017-11-29 DIAGNOSIS — I771 Stricture of artery: Secondary | ICD-10-CM

## 2017-11-29 DIAGNOSIS — I6501 Occlusion and stenosis of right vertebral artery: Secondary | ICD-10-CM | POA: Diagnosis not present

## 2017-11-29 DIAGNOSIS — I651 Occlusion and stenosis of basilar artery: Secondary | ICD-10-CM | POA: Diagnosis not present

## 2017-11-29 HISTORY — PX: IR RADIOLOGIST EVAL & MGMT: IMG5224

## 2017-11-30 ENCOUNTER — Encounter (HOSPITAL_COMMUNITY): Payer: Self-pay | Admitting: Interventional Radiology

## 2017-11-30 ENCOUNTER — Other Ambulatory Visit: Payer: Self-pay

## 2017-11-30 ENCOUNTER — Ambulatory Visit (INDEPENDENT_AMBULATORY_CARE_PROVIDER_SITE_OTHER): Payer: Medicare Other

## 2017-11-30 DIAGNOSIS — I480 Paroxysmal atrial fibrillation: Secondary | ICD-10-CM | POA: Diagnosis not present

## 2017-11-30 DIAGNOSIS — N189 Chronic kidney disease, unspecified: Secondary | ICD-10-CM | POA: Diagnosis not present

## 2017-11-30 DIAGNOSIS — R0602 Shortness of breath: Secondary | ICD-10-CM

## 2017-11-30 DIAGNOSIS — E039 Hypothyroidism, unspecified: Secondary | ICD-10-CM | POA: Diagnosis not present

## 2017-11-30 DIAGNOSIS — I10 Essential (primary) hypertension: Secondary | ICD-10-CM | POA: Diagnosis not present

## 2017-11-30 DIAGNOSIS — G459 Transient cerebral ischemic attack, unspecified: Secondary | ICD-10-CM | POA: Diagnosis not present

## 2017-11-30 DIAGNOSIS — C61 Malignant neoplasm of prostate: Secondary | ICD-10-CM | POA: Diagnosis not present

## 2017-11-30 NOTE — Progress Notes (Signed)
Pt has been made aware of normal result and verbalized understanding.  jw

## 2017-12-04 DIAGNOSIS — I69398 Other sequelae of cerebral infarction: Secondary | ICD-10-CM | POA: Diagnosis not present

## 2017-12-04 DIAGNOSIS — N182 Chronic kidney disease, stage 2 (mild): Secondary | ICD-10-CM | POA: Diagnosis not present

## 2017-12-04 DIAGNOSIS — I6521 Occlusion and stenosis of right carotid artery: Secondary | ICD-10-CM | POA: Diagnosis not present

## 2017-12-04 DIAGNOSIS — I480 Paroxysmal atrial fibrillation: Secondary | ICD-10-CM | POA: Diagnosis not present

## 2017-12-04 DIAGNOSIS — N400 Enlarged prostate without lower urinary tract symptoms: Secondary | ICD-10-CM | POA: Diagnosis not present

## 2017-12-04 DIAGNOSIS — I10 Essential (primary) hypertension: Secondary | ICD-10-CM | POA: Diagnosis not present

## 2017-12-04 DIAGNOSIS — Z6827 Body mass index (BMI) 27.0-27.9, adult: Secondary | ICD-10-CM | POA: Diagnosis not present

## 2017-12-04 DIAGNOSIS — E039 Hypothyroidism, unspecified: Secondary | ICD-10-CM | POA: Diagnosis not present

## 2017-12-07 ENCOUNTER — Telehealth: Payer: Self-pay | Admitting: *Deleted

## 2017-12-07 MED ORDER — APIXABAN 5 MG PO TABS: 5.0000 mg | ORAL_TABLET | Freq: Two times a day (BID) | ORAL | 0 refills | Status: DC

## 2017-12-07 MED ORDER — APIXABAN 5 MG PO TABS: 5.0000 mg | ORAL_TABLET | Freq: Two times a day (BID) | ORAL | 3 refills | Status: DC

## 2017-12-07 NOTE — Telephone Encounter (Signed)
Left message to return call 

## 2017-12-07 NOTE — Telephone Encounter (Signed)
Patient notified.  Will give printed rx to be used with a free 30-day trial card.  Also, another 90 day supply printed for him to take to [place] at his request.  Follow up in office scheduled for 01/02/2018 with Dr. Bronson Ing.

## 2017-12-07 NOTE — Telephone Encounter (Signed)
-----   Message from Herminio Commons, MD sent at 11/30/2017  8:23 AM EST ----- Regarding: RE: patient medication I'll make the switch. Gayle, please dc Brilinta and have him start Eliquis 5 mg bid. He will take this along with ASA 81 mg daily.  ----- Message ----- From: Joanell Rising Sent: 11/30/2017   7:35 AM To: Herminio Commons, MD Subject: RE: patient medication                         Deveshwar really does want him to continue the Aspirin 81mg  daily. So, will you be making the change for him in this regard? We won't try to change that med but Estanislado Pandy is leaving that up to you. Please let us know if you do change this medication.  Thank you, Anderson Malta  ----- Message ----- From: Herminio Commons, MD Sent: 11/29/2017   7:36 PM To: Joanell Rising Subject: RE: patient medication                         Ideally, I would have him on anticoagulation alone (Eliquis) so as to attenuate bleeding risk. If it is felt ASA is necessary by Dr. Estanislado Pandy, then I would stop Brilinta and start Eliquis 5 mg bid.  ----- Message ----- From: Joanell Rising Sent: 11/29/2017   4:55 PM To: Luanne Bras, MD, Herminio Commons, MD Subject: patient medication                             Dr. Jacinta Shoe,  Dr. Estanislado Pandy saw Mr. Ashon Rosenberg in the clinical today. We had a discussion concerning his taking Brilinita. The patient had asked if you thought he should be on a different anticoagulant due to his afib would it be ok for him to switch. Dr. Estanislado Pandy told him that he was not planning on treating his stenosis at this time unless he became symptomatic. He agreed that if you felt Eliquis would be a better medication that he could stop the Brilinta and switch to Eliquis under your care. He does want him to continue with the Aspirin 81mg  either way. Please let us know your thoughts on this.   Thank you,  Miles Costain

## 2017-12-13 ENCOUNTER — Ambulatory Visit (INDEPENDENT_AMBULATORY_CARE_PROVIDER_SITE_OTHER): Payer: Medicare Other | Admitting: Urology

## 2017-12-13 DIAGNOSIS — R351 Nocturia: Secondary | ICD-10-CM

## 2017-12-13 DIAGNOSIS — C61 Malignant neoplasm of prostate: Secondary | ICD-10-CM

## 2017-12-13 DIAGNOSIS — R3915 Urgency of urination: Secondary | ICD-10-CM | POA: Diagnosis not present

## 2018-01-02 ENCOUNTER — Encounter: Payer: Self-pay | Admitting: Cardiovascular Disease

## 2018-01-02 ENCOUNTER — Encounter: Payer: Self-pay | Admitting: *Deleted

## 2018-01-02 ENCOUNTER — Ambulatory Visit (INDEPENDENT_AMBULATORY_CARE_PROVIDER_SITE_OTHER): Payer: Medicare Other | Admitting: Cardiovascular Disease

## 2018-01-02 ENCOUNTER — Other Ambulatory Visit: Payer: Self-pay | Admitting: Cardiovascular Disease

## 2018-01-02 ENCOUNTER — Other Ambulatory Visit: Payer: Self-pay

## 2018-01-02 VITALS — BP 99/61 | HR 66 | Ht 66.0 in | Wt 183.0 lb

## 2018-01-02 DIAGNOSIS — I651 Occlusion and stenosis of basilar artery: Secondary | ICD-10-CM

## 2018-01-02 DIAGNOSIS — I10 Essential (primary) hypertension: Secondary | ICD-10-CM

## 2018-01-02 DIAGNOSIS — I4891 Unspecified atrial fibrillation: Secondary | ICD-10-CM | POA: Diagnosis not present

## 2018-01-02 DIAGNOSIS — I6509 Occlusion and stenosis of unspecified vertebral artery: Secondary | ICD-10-CM | POA: Diagnosis not present

## 2018-01-02 DIAGNOSIS — Z01812 Encounter for preprocedural laboratory examination: Secondary | ICD-10-CM

## 2018-01-02 DIAGNOSIS — R0602 Shortness of breath: Secondary | ICD-10-CM | POA: Diagnosis not present

## 2018-01-02 NOTE — Progress Notes (Signed)
SUBJECTIVE: The patient presents for follow-up of atrial fibrillation.  He was initially scheduled to undergo a neurovascular intervention for 89% stenosis of the right vertebrobasilar junction but the procedure was canceled.  He had some exertional dyspnea and saw 1 of our nurse practitioners on 11/24/17.  It was felt symptoms were related atrial fibrillation.  Blood pressure was elevated, 160/90, and labetalol was increased to 300 mg twice daily.  An echocardiogram was ordered.  It was performed on 11/30/17 and demonstrated normal left ventricular systolic function and regional wall motion, LVEF 55-60%, with mild left atrial and right atrial dilatation.  There is mild tricuspid regurgitation.  He continues to experience exertional dyspnea.  He increase labetalol to 300 mg twice daily for about 3 days and as his blood pressure was normal he reduced it back to 200 mg twice daily.  He checks his blood pressure at home frequently and it ranges from 120-140/80 range.  He denies chest pain.  He complains of a "greasy taste "to all foods and believes it is due to one or more of his medications.  He wonders if his exertional dyspnea is related to deconditioning from the inability to get any form of exercise due to back pain.  He has been on Eliquis for about a week.      Review of Systems: As per "subjective", otherwise negative.  Allergies  Allergen Reactions  . Tramadol Nausea Only    Current Outpatient Medications  Medication Sig Dispense Refill  . acetaminophen (TYLENOL) 500 MG tablet Take 1,000 mg by mouth 2 (two) times daily.     Marland Kitchen apixaban (ELIQUIS) 5 MG TABS tablet Take 1 tablet (5 mg total) by mouth 2 (two) times daily. 180 tablet 3  . aspirin EC 81 MG tablet Take 81 mg by mouth daily.    Marland Kitchen atorvastatin (LIPITOR) 10 MG tablet Take 10 mg by mouth every evening.     . Carboxymethylcellul-Glycerin (LUBRICATING EYE DROPS OP) Place 1-2 drops into both eyes 2 (two) times daily as  needed (for dry eyes).     Marland Kitchen diltiazem (CARDIZEM) 60 MG tablet Take 1 tablet (60 mg total) by mouth 2 (two) times daily. (May take an extra 30mg  in the evening as needed)  Med increased 10/26/2017.   PLACE ON FILE 75 tablet 6  . doxazosin (CARDURA) 8 MG tablet Take 8 mg by mouth at bedtime.    . fluticasone (FLONASE) 50 MCG/ACT nasal spray Place 2 sprays into both nostrils daily as needed for allergies or rhinitis.     Marland Kitchen labetalol (NORMODYNE) 200 MG tablet Take 1.5 tablets (300 mg total) by mouth 2 (two) times daily. 270 tablet 0  . levothyroxine (SYNTHROID, LEVOTHROID) 150 MCG tablet Take 150 mcg daily before breakfast by mouth.    Marland Kitchen lisinopril (PRINIVIL,ZESTRIL) 40 MG tablet Take 40 mg by mouth daily.    Marland Kitchen omeprazole (PRILOSEC) 20 MG capsule Take 20 mg by mouth daily.    Marland Kitchen oxyCODONE (OXY IR/ROXICODONE) 5 MG immediate release tablet Take 5 mg 2 (two) times daily as needed by mouth for severe pain.     No current facility-administered medications for this visit.    Facility-Administered Medications Ordered in Other Visits  Medication Dose Route Frequency Provider Last Rate Last Dose  . clopidogrel (PLAVIX) tablet 75 mg  75 mg Oral 60 min Pre-Op Monia Sabal, PA-C        Past Medical History:  Diagnosis Date  . Anxiety 10/31/2014  due to upcoming procedure  . Arrhythmia   . Arthritis   . Cancer ([place])    Melonoma on nose- removed  . Chronic kidney disease   . Dyspnea    with exertion; chronic.  "One night a week I get short of breath during the night- I talk to Dr Raliegh Ip on at last visit- 10/26/16  . Dysrhythmia    afib  . Enlarged prostate   . GERD (gastroesophageal reflux disease)   . Headache   . Hyperlipidemia   . Hypertension   . Hypothyroidism   . Overactive bladder   . Stroke [facility])    TIA - "nothing taste right anymore, right eye pain  . TIA (transient ischemic attack)   . Wears glasses     Past Surgical History:  Procedure Laterality Date  . BACK SURGERY  08/2013  .  CATARACT EXTRACTION    . COLONOSCOPY    . EYE SURGERY Bilateral    cataract  . IR ANGIO INTRA EXTRACRAN SEL COM CAROTID INNOMINATE BILAT MOD SED  08/04/2017  . IR ANGIO VERTEBRAL SEL VERTEBRAL BILAT MOD SED  08/04/2017  . IR ANGIO VERTEBRAL SEL VERTEBRAL UNI R MOD SED  11/01/2017  . IR RADIOLOGIST EVAL & MGMT  07/28/2017  . IR RADIOLOGIST EVAL & MGMT  08/22/2017  . IR RADIOLOGIST EVAL & MGMT  11/29/2017  . JOINT REPLACEMENT Left 10/2015   hip  . KNEE ARTHROPLASTY Left 1999  . KNEE ARTHROPLASTY Right 2008  . LUMBAR FUSION  06/2014  . RADIOLOGY WITH ANESTHESIA N/A 11/01/2017   Procedure: STENTING;  Surgeon: Luanne Bras, MD;  Location: [place];  Service: Radiology;  Laterality: N/A;  . TRANSURETHRAL RESECTION OF PROSTATE  03/2015  . TRIGGER FINGER RELEASE Right    3rd and 4th    Social History   Socioeconomic History  . Marital status: Married    Spouse name: Not on file  . Number of children: Not on file  . Years of education: Not on file  . Highest education level: Not on file  Social Needs  . Financial resource strain: Not on file  . Food insecurity - worry: Not on file  . Food insecurity - inability: Not on file  . Transportation needs - medical: Not on file  . Transportation needs - non-medical: Not on file  Occupational History  . Not on file  Tobacco Use  . Smoking status: Former Smoker    Years: 40.00    Types: Cigarettes    Start date: 10/17/1965    Last attempt to quit: 10/17/1978    Years since quitting: 39.2  . Smokeless tobacco: Never Used  Substance and Sexual Activity  . Alcohol use: No    Alcohol/week: 0.0 oz  . Drug use: No  . Sexual activity: Not on file  Other Topics Concern  . Not on file  Social History Narrative  . Not on file     Vitals:   01/02/18 0945  BP: 99/61  Pulse: 66  SpO2: 99%  Weight: 183 lb (83 kg)  Height: 5\' 6"  (1.676 m)    Wt Readings from Last 3 Encounters:  01/02/18 183 lb (83 kg)  11/24/17 182 lb 12.8 oz (82.9 kg)    10/26/17 184 lb (83.5 kg)     PHYSICAL EXAM General: NAD HEENT: Normal. Neck: No JVD, no thyromegaly. Lungs: Clear to auscultation bilaterally with normal respiratory effort. CV: Regular rate and irregular rhythm, normal S1/S2, no S3, no murmur. No pretibial or periankle  edema.    Abdomen: Soft, nontender, no distention.  Neurologic: Alert and oriented.  Psych: Normal affect. Skin: Normal. Musculoskeletal: No gross deformities.    ECG: Most recent ECG reviewed.   Labs: Lab Results  Component Value Date/Time   K 3.9 11/01/2017 06:22 AM   BUN 17 11/01/2017 06:22 AM   CREATININE 1.44 (H) 11/01/2017 06:22 AM   ALT 14 (L) 11/01/2017 06:22 AM   HGB 12.2 (L) 11/01/2017 06:22 AM     Lipids: No results found for: LDLCALC, LDLDIRECT, CHOL, TRIG, HDL     ASSESSMENT AND PLAN: 1.  Atrial fibrillation with exertional dyspnea: Heart rate is controlled.  He is systemically anticoagulated with Eliquis 5 mg twice daily but has only been taking it for a week.  All symptoms may indeed be due to cardiopulmonary deconditioning, I wonder if restoration of sinus rhythm we give him a better quality of life overall.  I will arrange for direct current cardioversion with me in a month.  I emphasized the importance of not missing a dose of Eliquis.  I will hold both diltiazem and labetalol on the morning of his procedure.  I will obtain a CBC and BMET.  2.  Chronic hypertension: Blood pressure is controlled.  3. Right vertebrobasilar junction stenosis of 89%: He is on aspirin 81 mg daily and Lipitor 10 mg.      Disposition: Follow up 2 weeks after cardioversion   Kate Sable, M.D., F.A.C.C.

## 2018-01-02 NOTE — Patient Instructions (Signed)
Medication Instructions:  Continue all current medications.  Labwork: CBC, BMET - orders given today.   Testing/Procedures: Your physician has recommended that you have a Cardioversion (DCCV). Electrical Cardioversion uses a jolt of electricity to your heart either through paddles or wired patches attached to your chest. This is a controlled, usually prescheduled, procedure. Defibrillation is done under light anesthesia in the hospital, and you usually go home the day of the procedure. This is done to get your heart back into a normal rhythm. You are not awake for the procedure. Please see the instruction sheet given to you today.  Follow-Up: 2 weeks - after procedure.   Any Other Special Instructions Will Be Listed Below (If Applicable).  If you need a refill on your cardiac medications before your next appointment, please call your pharmacy.

## 2018-01-19 ENCOUNTER — Telehealth: Payer: Self-pay | Admitting: Cardiology

## 2018-01-19 NOTE — Telephone Encounter (Signed)
Pre-cert Verification for the following procedure   Cardioversion scheduled for 01-31-18 at [facility]

## 2018-01-22 ENCOUNTER — Encounter: Payer: Self-pay | Admitting: *Deleted

## 2018-01-23 ENCOUNTER — Telehealth: Payer: Self-pay | Admitting: *Deleted

## 2018-01-23 NOTE — Telephone Encounter (Signed)
Cardioversion scheduled for 01/31/18 at 10:15 am at [facility].  He will need to arrive at short stay at 8:45 am.  Pre-op day is scheduled for 01/25/18 at 1:15 at short stay.    Instructions given for nothing to eat or drink after 12 midnight prior to procedure.  Please make sure to take your Eliquis as instructed.  Hold the Diltiazem & Labetalol the morning of only.  You may take your remaining medications with a sip of water.  Also, need to have a responsible person to drive you home.  Patient verbalized understanding.    Follow up given for 02/14/2018 with Stormy Fabian, NP in our [place] office.

## 2018-01-24 NOTE — Patient Instructions (Signed)
Scott [name]  01/24/2018     @PREFPERIOPPHARMACY @   Your procedure is scheduled on  01/31/2018   Report to [facility] at  54   A.M.  Call this number if you have problems the morning of surgery:  [phone]   Remember:  Do not eat food or drink liquids after midnight.  Take these medicines the morning of surgery with A SIP OF WATER  Levothyroxine, prilosec, oxy IR.   Do not wear jewelry, make-up or nail polish.  Do not wear lotions, powders, or perfumes, or deodorant.  Do not shave 48 hours prior to surgery.  Men may shave face and neck.  Do not bring valuables to the hospital.  [facility] is not responsible for any belongings or valuables.  Contacts, dentures or bridgework may not be worn into surgery.  Leave your suitcase in the car.  After surgery it may be brought to your room.  For patients admitted to the hospital, discharge time will be determined by your treatment team.  Patients discharged the day of surgery will not be allowed to drive home.   Name and phone number of your driver:   family Special instructions:  None  Please read over the following fact sheets that you were given. Anesthesia Post-op Instructions and Care and Recovery After Surgery       Electrical Cardioversion Electrical cardioversion is the delivery of a jolt of electricity to restore a normal rhythm to the heart. A rhythm that is too fast or is not regular keeps the heart from pumping well. In this procedure, sticky patches or metal paddles are placed on the chest to deliver electricity to the heart from a device. This procedure may be done in an emergency if:  There is low or no blood pressure as a result of the heart rhythm.  Normal rhythm must be restored as fast as possible to protect the brain and heart from further damage.  It may save a life.  This procedure may also be done for irregular or fast heart rhythms that are not immediately life-threatening. Tell  a health care provider about:  Any allergies you have.  All medicines you are taking, including vitamins, herbs, eye drops, creams, and over-the-counter medicines.  Any problems you or family members have had with anesthetic medicines.  Any blood disorders you have.  Any surgeries you have had.  Any medical conditions you have.  Whether you are pregnant or may be pregnant. What are the risks? Generally, this is a safe procedure. However, problems may occur, including:  Allergic reactions to medicines.  A blood clot that breaks free and travels to other parts of your body.  The possible return of an abnormal heart rhythm within hours or days after the procedure.  Your heart stopping (cardiac arrest). This is rare.  What happens before the procedure? Medicines  Your health care provider may have you start taking: ? Blood-thinning medicines (anticoagulants) so your blood does not clot as easily. ? Medicines may be given to help stabilize your heart rate and rhythm.  Ask your health care provider about changing or stopping your regular medicines. This is especially important if you are taking diabetes medicines or blood thinners. General instructions  Plan to have someone take you home from the hospital or clinic.  If you will be going home right after the procedure, plan to have someone with you for 24 hours.  Follow instructions from  your health care provider about eating or drinking restrictions. What happens during the procedure?  To lower your risk of infection: ? Your health care team will wash or sanitize their hands. ? Your skin will be washed with soap.  An IV tube will be inserted into one of your veins.  You will be given a medicine to help you relax (sedative).  Sticky patches (electrodes) or metal paddles may be placed on your chest.  An electrical shock will be delivered. The procedure may vary among health care providers and hospitals. What happens  after the procedure?  Your blood pressure, heart rate, breathing rate, and blood oxygen level will be monitored until the medicines you were given have worn off.  Do not drive for 24 hours if you were given a sedative.  Your heart rhythm will be watched to make sure it does not change. This information is not intended to replace advice given to you by your health care provider. Make sure you discuss any questions you have with your health care provider. Document Released: 09/23/2002 Document Revised: 06/01/2016 Document Reviewed: 04/08/2016 Elsevier Interactive Patient Education  2017 [employer].  Electrical Cardioversion, Care After This sheet gives you information about how to care for yourself after your procedure. Your health care provider may also give you more specific instructions. If you have problems or questions, contact your health care provider. What can I expect after the procedure? After the procedure, it is common to have:  Some redness on the skin where the shocks were given.  Follow these instructions at home:  Do not drive for 24 hours if you were given a medicine to help you relax (sedative).  Take over-the-counter and prescription medicines only as told by your health care provider.  Ask your health care provider how to check your pulse. Check it often.  Rest for 48 hours after the procedure or as told by your health care provider.  Avoid or limit your caffeine use as told by your health care provider. Contact a health care provider if:  You feel like your heart is beating too quickly or your pulse is not regular.  You have a serious muscle cramp that does not go away. Get help right away if:  You have discomfort in your chest.  You are dizzy or you feel faint.  You have trouble breathing or you are short of breath.  Your speech is slurred.  You have trouble moving an arm or leg on one side of your body.  Your fingers or toes turn cold or  blue. This information is not intended to replace advice given to you by your health care provider. Make sure you discuss any questions you have with your health care provider. Document Released: 07/24/2013 Document Revised: 05/06/2016 Document Reviewed: 04/08/2016 Elsevier Interactive Patient Education  2018 [name] Anesthesia is a term that refers to techniques, procedures, and medicines that help a person stay safe and comfortable during a medical procedure. Monitored anesthesia care, or sedation, is one type of anesthesia. Your anesthesia specialist may recommend sedation if you will be having a procedure that does not require you to be unconscious, such as:  Cataract surgery.  A dental procedure.  A biopsy.  A colonoscopy.  During the procedure, you may receive a medicine to help you relax (sedative). There are three levels of sedation:  Mild sedation. At this level, you may feel awake and relaxed. You will be able to follow directions.  Moderate sedation. At this level, you will be sleepy. You may not remember the procedure.  Deep sedation. At this level, you will be asleep. You will not remember the procedure.  The more medicine you are given, the deeper your level of sedation will be. Depending on how you respond to the procedure, the anesthesia specialist may change your level of sedation or the type of anesthesia to fit your needs. An anesthesia specialist will monitor you closely during the procedure. Let your health care provider know about:  Any allergies you have.  All medicines you are taking, including vitamins, herbs, eye drops, creams, and over-the-counter medicines.  Any use of steroids (by mouth or as a cream).  Any problems you or family members have had with sedatives and anesthetic medicines.  Any blood disorders you have.  Any surgeries you have had.  Any medical conditions you have, such as sleep apnea.  Whether you  are pregnant or may be pregnant.  Any use of cigarettes, alcohol, or street drugs. What are the risks? Generally, this is a safe procedure. However, problems may occur, including:  Getting too much medicine (oversedation).  Nausea.  Allergic reaction to medicines.  Trouble breathing. If this happens, a breathing tube may be used to help with breathing. It will be removed when you are awake and breathing on your own.  Heart trouble.  Lung trouble.  Before the procedure Staying hydrated Follow instructions from your health care provider about hydration, which may include:  Up to 2 hours before the procedure - you may continue to drink clear liquids, such as water, clear fruit juice, black coffee, and plain tea.  Eating and drinking restrictions Follow instructions from your health care provider about eating and drinking, which may include:  8 hours before the procedure - stop eating heavy meals or foods such as meat, fried foods, or fatty foods.  6 hours before the procedure - stop eating light meals or foods, such as toast or cereal.  6 hours before the procedure - stop drinking milk or drinks that contain milk.  2 hours before the procedure - stop drinking clear liquids.  Medicines Ask your health care provider about:  Changing or stopping your regular medicines. This is especially important if you are taking diabetes medicines or blood thinners.  Taking medicines such as aspirin and ibuprofen. These medicines can thin your blood. Do not take these medicines before your procedure if your health care provider instructs you not to.  Tests and exams  You will have a physical exam.  You may have blood tests done to show: ? How well your kidneys and liver are working. ? How well your blood can clot.  General instructions  Plan to have someone take you home from the hospital or clinic.  If you will be going home right after the procedure, plan to have someone with you  for 24 hours.  What happens during the procedure?  Your blood pressure, heart rate, breathing, level of pain and overall condition will be monitored.  An IV tube will be inserted into one of your veins.  Your anesthesia specialist will give you medicines as needed to keep you comfortable during the procedure. This may mean changing the level of sedation.  The procedure will be performed. After the procedure  Your blood pressure, heart rate, breathing rate, and blood oxygen level will be monitored until the medicines you were given have worn off.  Do not drive for 24 hours if you  received a sedative.  You may: ? Feel sleepy, clumsy, or nauseous. ? Feel forgetful about what happened after the procedure. ? Have a sore throat if you had a breathing tube during the procedure. ? Vomit. This information is not intended to replace advice given to you by your health care provider. Make sure you discuss any questions you have with your health care provider. Document Released: 06/29/2005 Document Revised: 03/11/2016 Document Reviewed: 01/24/2016 Elsevier Interactive Patient Education  2018 [place], Care After These instructions provide you with information about caring for yourself after your procedure. Your health care provider may also give you more specific instructions. Your treatment has been planned according to current medical practices, but problems sometimes occur. Call your health care provider if you have any problems or questions after your procedure. What can I expect after the procedure? After your procedure, it is common to:  Feel sleepy for several hours.  Feel clumsy and have poor balance for several hours.  Feel forgetful about what happened after the procedure.  Have poor judgment for several hours.  Feel nauseous or vomit.  Have a sore throat if you had a breathing tube during the procedure.  Follow these instructions at home: For  at least 24 hours after the procedure:   Do not: ? Participate in activities in which you could fall or become injured. ? Drive. ? Use heavy machinery. ? Drink alcohol. ? Take sleeping pills or medicines that cause drowsiness. ? Make important decisions or sign legal documents. ? Take care of children on your own.  Rest. Eating and drinking  Follow the diet that is recommended by your health care provider.  If you vomit, drink water, juice, or soup when you can drink without vomiting.  Make sure you have little or no nausea before eating solid foods. General instructions  Have a responsible adult stay with you until you are awake and alert.  Take over-the-counter and prescription medicines only as told by your health care provider.  If you smoke, do not smoke without supervision.  Keep all follow-up visits as told by your health care provider. This is important. Contact a health care provider if:  You keep feeling nauseous or you keep vomiting.  You feel light-headed.  You develop a rash.  You have a fever. Get help right away if:  You have trouble breathing. This information is not intended to replace advice given to you by your health care provider. Make sure you discuss any questions you have with your health care provider. Document Released: 01/24/2016 Document Revised: 05/25/2016 Document Reviewed: 01/24/2016 Elsevier Interactive Patient Education  [employer].

## 2018-01-25 ENCOUNTER — Encounter (HOSPITAL_COMMUNITY): Payer: Self-pay

## 2018-01-25 ENCOUNTER — Encounter (HOSPITAL_COMMUNITY)
Admission: RE | Admit: 2018-01-25 | Discharge: 2018-01-25 | Disposition: A | Discharge status: Home / Self Care | Payer: Medicare Other | Source: Ambulatory Visit | Attending: Cardiology | Admitting: Cardiology

## 2018-01-25 ENCOUNTER — Other Ambulatory Visit: Payer: Self-pay

## 2018-01-25 DIAGNOSIS — Z01818 Encounter for other preprocedural examination: Secondary | ICD-10-CM | POA: Diagnosis not present

## 2018-01-25 LAB — CBC WITH DIFFERENTIAL/PLATELET
BASOS ABS: 0 10*3/uL (ref 0.0–0.1)
BASOS PCT: 0 %
Eosinophils Absolute: 0.2 10*3/uL (ref 0.0–0.7)
Eosinophils Relative: 3 %
HCT: 37.7 % — ABNORMAL LOW (ref 39.0–52.0)
Hemoglobin: 11.9 g/dL — ABNORMAL LOW (ref 13.0–17.0)
Lymphocytes Relative: 31 %
Lymphs Abs: 1.7 10*3/uL (ref 0.7–4.0)
MCH: 28.2 pg (ref 26.0–34.0)
MCHC: 31.6 g/dL (ref 30.0–36.0)
MCV: 89.3 fL (ref 78.0–100.0)
MONO ABS: 0.9 10*3/uL (ref 0.1–1.0)
MONOS PCT: 16 %
NEUTROS PCT: 50 %
Neutro Abs: 2.8 10*3/uL (ref 1.7–7.7)
Platelets: 203 10*3/uL (ref 150–400)
RBC: 4.22 MIL/uL (ref 4.22–5.81)
RDW: 16.2 % — AB (ref 11.5–15.5)
WBC: 5.6 10*3/uL (ref 4.0–10.5)

## 2018-01-25 LAB — BASIC METABOLIC PANEL
ANION GAP: 13 (ref 5–15)
BUN: 27 mg/dL — ABNORMAL HIGH (ref 6–20)
CALCIUM: 8.7 mg/dL — AB (ref 8.9–10.3)
CO2: 23 mmol/L (ref 22–32)
Chloride: 102 mmol/L (ref 101–111)
Creatinine, Ser: 1.62 mg/dL — ABNORMAL HIGH (ref 0.61–1.24)
GFR, EST AFRICAN AMERICAN: 42 mL/min — AB (ref >60)
GFR, EST NON AFRICAN AMERICAN: 37 mL/min — AB (ref >60)
GLUCOSE: 94 mg/dL (ref 65–99)
Potassium: 4.4 mmol/L (ref 3.5–5.1)
Sodium: 138 mmol/L (ref 135–145)

## 2018-01-30 ENCOUNTER — Ambulatory Visit: Payer: Medicare Other | Admitting: Cardiovascular Disease

## 2018-01-31 ENCOUNTER — Telehealth: Payer: Self-pay | Admitting: *Deleted

## 2018-01-31 ENCOUNTER — Ambulatory Visit (HOSPITAL_COMMUNITY)
Admission: RE | Admit: 2018-01-31 | Discharge: 2018-01-31 | Disposition: A | Discharge status: Home / Self Care | Payer: Medicare Other | Source: Ambulatory Visit | Attending: Cardiology | Admitting: Cardiology

## 2018-01-31 ENCOUNTER — Ambulatory Visit (HOSPITAL_COMMUNITY): Payer: Medicare Other | Admitting: Anesthesiology

## 2018-01-31 ENCOUNTER — Encounter (HOSPITAL_COMMUNITY): Payer: Self-pay | Admitting: *Deleted

## 2018-01-31 ENCOUNTER — Encounter (HOSPITAL_COMMUNITY)
Admission: RE | Disposition: A | Discharge status: Home / Self Care | Payer: Self-pay | Source: Ambulatory Visit | Attending: Cardiology

## 2018-01-31 DIAGNOSIS — I1310 Hypertensive heart and chronic kidney disease without heart failure, with stage 1 through stage 4 chronic kidney disease, or unspecified chronic kidney disease: Secondary | ICD-10-CM | POA: Diagnosis not present

## 2018-01-31 DIAGNOSIS — Z8673 Personal history of transient ischemic attack (TIA), and cerebral infarction without residual deficits: Secondary | ICD-10-CM | POA: Insufficient documentation

## 2018-01-31 DIAGNOSIS — Z79890 Hormone replacement therapy (postmenopausal): Secondary | ICD-10-CM | POA: Insufficient documentation

## 2018-01-31 DIAGNOSIS — E039 Hypothyroidism, unspecified: Secondary | ICD-10-CM | POA: Insufficient documentation

## 2018-01-31 DIAGNOSIS — N3281 Overactive bladder: Secondary | ICD-10-CM | POA: Insufficient documentation

## 2018-01-31 DIAGNOSIS — Z884 Allergy status to anesthetic agent status: Secondary | ICD-10-CM | POA: Diagnosis not present

## 2018-01-31 DIAGNOSIS — I4891 Unspecified atrial fibrillation: Secondary | ICD-10-CM | POA: Insufficient documentation

## 2018-01-31 DIAGNOSIS — Z7901 Long term (current) use of anticoagulants: Secondary | ICD-10-CM | POA: Insufficient documentation

## 2018-01-31 DIAGNOSIS — N189 Chronic kidney disease, unspecified: Secondary | ICD-10-CM | POA: Diagnosis not present

## 2018-01-31 DIAGNOSIS — I10 Essential (primary) hypertension: Secondary | ICD-10-CM | POA: Diagnosis not present

## 2018-01-31 DIAGNOSIS — K219 Gastro-esophageal reflux disease without esophagitis: Secondary | ICD-10-CM | POA: Insufficient documentation

## 2018-01-31 DIAGNOSIS — Z7982 Long term (current) use of aspirin: Secondary | ICD-10-CM | POA: Diagnosis not present

## 2018-01-31 DIAGNOSIS — Z79899 Other long term (current) drug therapy: Secondary | ICD-10-CM | POA: Insufficient documentation

## 2018-01-31 DIAGNOSIS — E785 Hyperlipidemia, unspecified: Secondary | ICD-10-CM | POA: Insufficient documentation

## 2018-01-31 DIAGNOSIS — Z7951 Long term (current) use of inhaled steroids: Secondary | ICD-10-CM | POA: Diagnosis not present

## 2018-01-31 DIAGNOSIS — N400 Enlarged prostate without lower urinary tract symptoms: Secondary | ICD-10-CM | POA: Diagnosis not present

## 2018-01-31 DIAGNOSIS — Z87891 Personal history of nicotine dependence: Secondary | ICD-10-CM | POA: Insufficient documentation

## 2018-01-31 HISTORY — PX: CARDIOVERSION: SHX1299

## 2018-01-31 SURGERY — CARDIOVERSION
Anesthesia: Monitor Anesthesia Care

## 2018-01-31 MED ORDER — PROPOFOL 10 MG/ML IV BOLUS
INTRAVENOUS | Status: DC | PRN
  Administered 2018-01-31 (×2): 20 mg via INTRAVENOUS

## 2018-01-31 MED ORDER — MIDAZOLAM HCL 2 MG/2ML IJ SOLN: 0.5000 mg | Freq: Once | INTRAMUSCULAR | Status: DC | PRN

## 2018-01-31 MED ORDER — PROMETHAZINE HCL 25 MG/ML IJ SOLN: 6.2500 mg | INTRAMUSCULAR | Status: DC | PRN

## 2018-01-31 MED ORDER — PROPOFOL 500 MG/50ML IV EMUL
INTRAVENOUS | Status: DC | PRN
  Administered 2018-01-31: 150 ug/kg/min via INTRAVENOUS

## 2018-01-31 MED ORDER — LACTATED RINGERS IV SOLN
INTRAVENOUS | Status: DC
  Administered 2018-01-31: 08:00:00 via INTRAVENOUS

## 2018-01-31 MED ORDER — ACETAMINOPHEN 10 MG/ML IV SOLN: 1000.0000 mg | Freq: Once | INTRAVENOUS | Status: DC | PRN

## 2018-01-31 NOTE — Discharge Instructions (Signed)
PATIENT INSTRUCTIONS °POST-ANESTHESIA ° °IMMEDIATELY FOLLOWING SURGERY:  Do not drive or operate machinery for the first twenty four hours after surgery.  Do not make any important decisions for twenty four hours after surgery or while taking narcotic pain medications or sedatives.  If you develop intractable nausea and vomiting or a severe headache please notify your doctor immediately. ° °FOLLOW-UP:  Please make an appointment with your surgeon as instructed. You do not need to follow up with anesthesia unless specifically instructed to do so. ° °WOUND CARE INSTRUCTIONS (if applicable):  Keep a dry clean dressing on the anesthesia/puncture wound site if there is drainage.  Once the wound has quit draining you may leave it open to air.  Generally you should leave the bandage intact for twenty four hours unless there is drainage.  If the epidural site drains for more than 36-48 hours please call the anesthesia department. ° °QUESTIONS?:  Please feel free to call your physician or the hospital operator if you have any questions, and they will be happy to assist you.    ° ° ° ° ° °Electrical Cardioversion, Care After °This sheet gives you information about how to care for yourself after your procedure. Your health care provider may also give you more specific instructions. If you have problems or questions, contact your health care provider. °What can I expect after the procedure? °After the procedure, it is common to have: °· Some redness on the skin where the shocks were given. ° °Follow these instructions at home: °· Do not drive for 24 hours if you were given a medicine to help you relax (sedative). °· Take over-the-counter and prescription medicines only as told by your health care provider. °· Ask your health care provider how to check your pulse. Check it often. °· Rest for 48 hours after the procedure or as told by your health care provider. °· Avoid or limit your caffeine use as told by your health care  provider. °Contact a health care provider if: °· You feel like your heart is beating too quickly or your pulse is not regular. °· You have a serious muscle cramp that does not go away. °Get help right away if: °· You have discomfort in your chest. °· You are dizzy or you feel faint. °· You have trouble breathing or you are short of breath. °· Your speech is slurred. °· You have trouble moving an arm or leg on one side of your body. °· Your fingers or toes turn cold or blue. °This information is not intended to replace advice given to you by your health care provider. Make sure you discuss any questions you have with your health care provider. °Document Released: 07/24/2013 Document Revised: 05/06/2016 Document Reviewed: 04/08/2016 °Elsevier Interactive Patient Education © 2018 Elsevier Inc[employer] ° ° °

## 2018-01-31 NOTE — Anesthesia Preprocedure Evaluation (Addendum)
Anesthesia Evaluation  Patient identified by MRN, date of birth, ID band Patient awake    Reviewed: Allergy & Precautions, H&P , NPO status , Patient's Chart, lab work & pertinent test results, reviewed documented beta blocker date and time   Airway Mallampati: III  TM Distance: >3 FB Neck ROM: full    Dental no notable dental hx. (+) Teeth Intact, Poor Dentition, Chipped, Missing   Pulmonary neg pulmonary ROS, former smoker,    Pulmonary exam normal breath sounds clear to auscultation       Cardiovascular Exercise Tolerance: Good hypertension, negative cardio ROS   Rhythm:irregular Rate:Normal     Neuro/Psych negative neurological ROS  negative psych ROS   GI/Hepatic negative GI ROS, Neg liver ROS,   Endo/Other  negative endocrine ROS  Renal/GU negative Renal ROS  negative genitourinary   Musculoskeletal   Abdominal   Peds  Hematology negative hematology ROS (+)   Anesthesia Other Findings NPO confirmed; Echocardiogram reviewed; most recent EKG reviewed  Reproductive/Obstetrics negative OB ROS                            Anesthesia Physical Anesthesia Plan  ASA: III  Anesthesia Plan: MAC   Post-op Pain Management:    Induction:   PONV Risk Score and Plan:   Airway Management Planned:   Additional Equipment:   Intra-op Plan:   Post-operative Plan:   Informed Consent: I have reviewed the patients History and Physical, chart, labs and discussed the procedure including the risks, benefits and alternatives for the proposed anesthesia with the patient or authorized representative who has indicated his/her understanding and acceptance.   Dental Advisory Given  Plan Discussed with: CRNA  Anesthesia Plan Comments:         Anesthesia Quick Evaluation

## 2018-01-31 NOTE — Transfer of Care (Signed)
Immediate Anesthesia Transfer of Care Note  Patient: Scott [name]  Procedure(s) Performed: CARDIOVERSION (N/A )  Patient Location: PACU  Anesthesia Type:MAC  Level of Consciousness: awake and alert   Airway & Oxygen Therapy: Patient Spontanous Breathing  Post-op Assessment: Report given to RN  Post vital signs: Reviewed and stable  Last Vitals:  Vitals Value Taken Time  BP    Temp    Pulse    Resp    SpO2      Last Pain:  Vitals:   01/31/18 0815  TempSrc: Oral  PainSc: 5       Patients Stated Pain Goal: 7 (03/88/82 8003)  Complications: No apparent anesthesia complications

## 2018-01-31 NOTE — H&P (Signed)
Procedure H&P  Please see referenced clinic note below by Dr Bronson Ing for full history. Patient with history of afib presents for cardioversion. He has been on eliquis for anticoagulation. We will plan for electrical cardioversion today.  Carlyle Dolly MD   SUBJECTIVE: The patient presents for follow-up of atrial fibrillation.  He was initially scheduled to undergo a neurovascular intervention for 89% stenosis of the right vertebrobasilar junction but the procedure was canceled.  He had some exertional dyspnea and saw 1 of our nurse practitioners on 11/24/17.  It was felt symptoms were related atrial fibrillation.  Blood pressure was elevated, 160/90, and labetalol was increased to 300 mg twice daily.  An echocardiogram was ordered.  It was performed on 11/30/17 and demonstrated normal left ventricular systolic function and regional wall motion, LVEF 55-60%, with mild left atrial and right atrial dilatation.  There is mild tricuspid regurgitation.  He continues to experience exertional dyspnea.  He increase labetalol to 300 mg twice daily for about 3 days and as his blood pressure was normal he reduced it back to 200 mg twice daily.  He checks his blood pressure at home frequently and it ranges from 120-140/80 range.  He denies chest pain.  He complains of a "greasy taste "to all foods and believes it is due to one or more of his medications.  He wonders if his exertional dyspnea is related to deconditioning from the inability to get any form of exercise due to back pain.  He has been on Eliquis for about a week.      Review of Systems: As per "subjective", otherwise negative.      Allergies  Allergen Reactions  . Tramadol Nausea Only          Current Outpatient Medications  Medication Sig Dispense Refill  . acetaminophen (TYLENOL) 500 MG tablet Take 1,000 mg by mouth 2 (two) times daily.     Marland Kitchen apixaban (ELIQUIS) 5 MG TABS tablet Take 1 tablet (5 mg total) by  mouth 2 (two) times daily. 180 tablet 3  . aspirin EC 81 MG tablet Take 81 mg by mouth daily.    Marland Kitchen atorvastatin (LIPITOR) 10 MG tablet Take 10 mg by mouth every evening.     . Carboxymethylcellul-Glycerin (LUBRICATING EYE DROPS OP) Place 1-2 drops into both eyes 2 (two) times daily as needed (for dry eyes).     Marland Kitchen diltiazem (CARDIZEM) 60 MG tablet Take 1 tablet (60 mg total) by mouth 2 (two) times daily. (May take an extra 30mg  in the evening as needed)  Med increased 10/26/2017.   PLACE ON FILE 75 tablet 6  . doxazosin (CARDURA) 8 MG tablet Take 8 mg by mouth at bedtime.    . fluticasone (FLONASE) 50 MCG/ACT nasal spray Place 2 sprays into both nostrils daily as needed for allergies or rhinitis.     Marland Kitchen labetalol (NORMODYNE) 200 MG tablet Take 1.5 tablets (300 mg total) by mouth 2 (two) times daily. 270 tablet 0  . levothyroxine (SYNTHROID, LEVOTHROID) 150 MCG tablet Take 150 mcg daily before breakfast by mouth.    Marland Kitchen lisinopril (PRINIVIL,ZESTRIL) 40 MG tablet Take 40 mg by mouth daily.    Marland Kitchen omeprazole (PRILOSEC) 20 MG capsule Take 20 mg by mouth daily.    Marland Kitchen oxyCODONE (OXY IR/ROXICODONE) 5 MG immediate release tablet Take 5 mg 2 (two) times daily as needed by mouth for severe pain.     No current facility-administered medications for this visit.  Facility-Administered Medications Ordered in Other Visits  Medication Dose Route Frequency Provider Last Rate Last Dose  . clopidogrel (PLAVIX) tablet 75 mg  75 mg Oral 60 min Pre-Op Monia Sabal, PA-C            Past Medical History:  Diagnosis Date  . Anxiety 10/31/2014   due to upcoming procedure  . Arrhythmia   . Arthritis   . Cancer ([place])    Melonoma on nose- removed  . Chronic kidney disease   . Dyspnea    with exertion; chronic.  "One night a week I get short of breath during the night- I talk to Dr Raliegh Ip on at last visit- 10/26/16  . Dysrhythmia    afib  . Enlarged prostate   . GERD  (gastroesophageal reflux disease)   . Headache   . Hyperlipidemia   . Hypertension   . Hypothyroidism   . Overactive bladder   . Stroke [facility])    TIA - "nothing taste right anymore, right eye pain  . TIA (transient ischemic attack)   . Wears glasses          Past Surgical History:  Procedure Laterality Date  . BACK SURGERY  08/2013  . CATARACT EXTRACTION    . COLONOSCOPY    . EYE SURGERY Bilateral    cataract  . IR ANGIO INTRA EXTRACRAN SEL COM CAROTID INNOMINATE BILAT MOD SED  08/04/2017  . IR ANGIO VERTEBRAL SEL VERTEBRAL BILAT MOD SED  08/04/2017  . IR ANGIO VERTEBRAL SEL VERTEBRAL UNI R MOD SED  11/01/2017  . IR RADIOLOGIST EVAL & MGMT  07/28/2017  . IR RADIOLOGIST EVAL & MGMT  08/22/2017  . IR RADIOLOGIST EVAL & MGMT  11/29/2017  . JOINT REPLACEMENT Left 10/2015   hip  . KNEE ARTHROPLASTY Left 1999  . KNEE ARTHROPLASTY Right 2008  . LUMBAR FUSION  06/2014  . RADIOLOGY WITH ANESTHESIA N/A 11/01/2017   Procedure: STENTING;  Surgeon: Luanne Bras, MD;  Location: [facility];  Service: Radiology;  Laterality: N/A;  . TRANSURETHRAL RESECTION OF PROSTATE  03/2015  . TRIGGER FINGER RELEASE Right    3rd and 4th    Social History        Socioeconomic History  . Marital status: Married    Spouse name: Not on file  . Number of children: Not on file  . Years of education: Not on file  . Highest education level: Not on file  Social Needs  . Financial resource strain: Not on file  . Food insecurity - worry: Not on file  . Food insecurity - inability: Not on file  . Transportation needs - medical: Not on file  . Transportation needs - non-medical: Not on file  Occupational History  . Not on file  Tobacco Use  . Smoking status: Former Smoker    Years: 40.00    Types: Cigarettes    Start date: 10/17/1965    Last attempt to quit: 10/17/1978    Years since quitting: 39.2  . Smokeless tobacco: Never Used  Substance and Sexual  Activity  . Alcohol use: No    Alcohol/week: 0.0 oz  . Drug use: No  . Sexual activity: Not on file  Other Topics Concern  . Not on file  Social History Narrative  . Not on file        Vitals:   01/02/18 0945  BP: 99/61  Pulse: 66  SpO2: 99%  Weight: 183 lb (83 kg)  Height: 5\' 6"  (1.676 m)  Wt Readings from Last 3 Encounters:  01/02/18 183 lb (83 kg)  11/24/17 182 lb 12.8 oz (82.9 kg)  10/26/17 184 lb (83.5 kg)     PHYSICAL EXAM General: NAD HEENT: Normal. Neck: No JVD, no thyromegaly. Lungs: Clear to auscultation bilaterally with normal respiratory effort. CV: Regular rate and irregularrhythm, normal S1/S2, no S3, no murmur. No pretibial or periankle edema.    Abdomen: Soft, nontender, no distention.  Neurologic: Alert and oriented.  Psych: Normal affect. Skin: Normal. Musculoskeletal: No gross deformities.    ECG: Most recent ECG reviewed.   Labs: Labs(Brief)       Lab Results  Component Value Date/Time   K 3.9 11/01/2017 06:22 AM   BUN 17 11/01/2017 06:22 AM   CREATININE 1.44 (H) 11/01/2017 06:22 AM   ALT 14 (L) 11/01/2017 06:22 AM   HGB 12.2 (L) 11/01/2017 06:22 AM       Lipids: Labs(Brief)  No results found for: LDLCALC, LDLDIRECT, CHOL, TRIG, HDL       ASSESSMENT AND PLAN: 1.  Atrial fibrillation with exertional dyspnea: Heart rate is controlled.  He is systemically anticoagulated with Eliquis 5 mg twice daily but has only been taking it for a week.  All symptoms may indeed be due to cardiopulmonary deconditioning, I wonder if restoration of sinus rhythm we give him a better quality of life overall.  I will arrange for direct current cardioversion with me in a month.  I emphasized the importance of not missing a dose of Eliquis.  I will hold both diltiazem and labetalol on the morning of his procedure.  I will obtain a CBC and BMET.  2.  Chronic hypertension: Blood pressure is controlled.  3. Right  vertebrobasilar junction stenosis of 89%: He is on aspirin 81 mg daily and Lipitor 10 mg.

## 2018-01-31 NOTE — CV Procedure (Signed)
Procedure: electrical cardioversion Physician: Dr Carlyle Dolly MD Indication: afib   Patient was brought to the procedure suite after appropriate consent was obtained. Defib pads were placed in the anterior and posterior positions. Sedation was achieved with the assistance of anesthesiology, for details please refer to there note. The patient was succesfully cardioverted to normal sinus rhythm with a synchronized 200j shock. Cardiopulmonary was performed throughout the procedure, he tolerated well without complications.    Carlyle Dolly MD

## 2018-01-31 NOTE — Progress Notes (Signed)
Electrical Cardioversion Procedure Note KORDEL LEAVY [id number] 02/10/1928  Procedure: Electrical Cardioversion Indications:  Persistent A-Fib Procedure Details Consent: Yes Time Out: Verified patient identification, verified procedure, site/side was marked, verified correct patient position, special equipment/implants available, medications/allergies/relevent history reviewed, required imaging and test results available.  Time Out: 0934 Patient placed on cardiac monitor, pulse oximetry, supplemental oxygen as necessary.  Sedation given: Yes Pacer pads placedYes Cardioverted  1 time(s).  Cardioverted at 200 J  Evaluation Findings: Post procedure EKG shows:  Normal Sinus Rhythm Complications: none Patient did tolerate procedure well.   Hillery Jacks 01/31/2018, 10:05 AM

## 2018-01-31 NOTE — Anesthesia Postprocedure Evaluation (Signed)
Anesthesia Post Note  Patient: Scott [name]  Procedure(s) Performed: CARDIOVERSION (N/A )  Patient location during evaluation: PACU Anesthesia Type: MAC Level of consciousness: awake and alert Pain management: pain level controlled Vital Signs Assessment: post-procedure vital signs reviewed and stable Respiratory status: spontaneous breathing, nonlabored ventilation, respiratory function stable and patient connected to nasal cannula oxygen Cardiovascular status: stable and blood pressure returned to baseline Postop Assessment: no apparent nausea or vomiting Anesthetic complications: no     Last Vitals:  Vitals:   01/31/18 0815  BP: (!) 157/102  Pulse: 93  Resp: 18  Temp: 36.8 C  SpO2: 98%    Last Pain:  Vitals:   01/31/18 0815  TempSrc: Oral  PainSc: [name]

## 2018-01-31 NOTE — Telephone Encounter (Signed)
Notes recorded by Laurine Blazer, LPN on 10/19/1115 at 3:56 PM EDT Wife [name]) notified. Copy to pmd. Follow up scheduled with Stormy Fabian, PA 02/14/2018 in [place] office. ------  Notes recorded by Herminio Commons, MD on 01/30/2018 at 5:16 PM EDT CKD stage III. Values are slightly elevated when compared to January 2019. Please forward a copy to his PCP.

## 2018-02-06 ENCOUNTER — Encounter (HOSPITAL_COMMUNITY): Payer: Self-pay | Admitting: Cardiology

## 2018-02-08 NOTE — Addendum Note (Signed)
Addendum  created 02/08/18 0852 by Vista Deck, CRNA   Charge Capture section accepted

## 2018-02-13 NOTE — Progress Notes (Signed)
Cardiology Office Note    Date:  02/14/2018   ID:  [name], DOB 1928-03-13, MRN [id number]  PCP:  Curlene Labrum, MD  Cardiologist: Kate Sable, MD    Chief Complaint  Patient presents with  . Follow-up    recent DCCV    History of Present Illness:    Scott [name] is a 84 y.o. male with past medical history of persistent atrial fibrillation (on Eliquis), HTN, HLD, Stage III CKD, and prior TIA who presents to the office today for follow-up from his recent cardioversion.  He was recently evaluated by Dr. Bronson Ing in 12/2017 and reported episodes of continual exertional dyspnea and fatigue. It was thought that his symptoms were possibly secondary to atrial fibrillation and it was recommended to pursue a cardioversion following 1 month of anticoagulation. This was performed by Dr. Harl Bowie on 01/31/2018 and he was successfully cardioverted to normal sinus rhythm with a synchronized 200J shock.  In talking with the patient today, he reports overall feeling significantly improved since his cardioversion. He reports breathing has been at baseline and denies any recent palpitations, chest discomfort, lightheadedness, dizziness, or presyncope. No recent orthopnea, PND, or lower extremity edema.  He reports good compliance with Eliquis and denies any evidence of active bleeding. He is being followed by Dr. Estanislado Pandy for a right vertebrobasilar junction stenosis with planned surgery in the near future but not yet scheduled.    Past Medical History:  Diagnosis Date  . Anxiety 10/31/2014   due to upcoming procedure  . Arrhythmia   . Arthritis   . Cancer ([place])    Melonoma on nose- removed  . Chronic kidney disease   . Dyspnea   . Dysrhythmia    afib  . Enlarged prostate   . GERD (gastroesophageal reflux disease)   . Headache   . Hyperlipidemia   . Hypertension   . Hypothyroidism   . Overactive bladder   . Stroke [facility])    TIA - "nothing taste right anymore, right  eye pain  . TIA (transient ischemic attack)   . Wears glasses     Past Surgical History:  Procedure Laterality Date  . BACK SURGERY  08/2013   x2  . CARDIOVERSION N/A 01/31/2018   Procedure: CARDIOVERSION;  Surgeon: Arnoldo Lenis, MD;  [facility];  Service: Endoscopy;  Laterality: N/A;  . CATARACT EXTRACTION    . COLONOSCOPY    . EYE SURGERY Bilateral    cataract  . IR ANGIO INTRA EXTRACRAN SEL COM CAROTID INNOMINATE BILAT MOD SED  08/04/2017  . IR ANGIO VERTEBRAL SEL VERTEBRAL BILAT MOD SED  08/04/2017  . IR ANGIO VERTEBRAL SEL VERTEBRAL UNI R MOD SED  11/01/2017  . IR RADIOLOGIST EVAL & MGMT  07/28/2017  . IR RADIOLOGIST EVAL & MGMT  08/22/2017  . IR RADIOLOGIST EVAL & MGMT  11/29/2017  . JOINT REPLACEMENT Left 10/2015   hip  . KNEE ARTHROPLASTY Left 1999  . KNEE ARTHROPLASTY Right 2008  . LUMBAR FUSION  06/2014  . RADIOLOGY WITH ANESTHESIA N/A 11/01/2017   Procedure: STENTING;  Surgeon: Luanne Bras, MD;  Location: [facility];  Service: Radiology;  Laterality: N/A;  . TRANSURETHRAL RESECTION OF PROSTATE  03/2015  . TRIGGER FINGER RELEASE Right    3rd and 4th    Current Medications: Outpatient Medications Prior to Visit  Medication Sig Dispense Refill  . acetaminophen (TYLENOL) 500 MG tablet Take 1,000 mg by mouth 2 (two) times daily.     Marland Kitchen  apixaban (ELIQUIS) 5 MG TABS tablet Take 1 tablet (5 mg total) by mouth 2 (two) times daily. 180 tablet 3  . aspirin EC 81 MG tablet Take 81 mg by mouth at bedtime.     Marland Kitchen atorvastatin (LIPITOR) 10 MG tablet Take 10 mg by mouth every evening.     . Carboxymethylcellul-Glycerin (LUBRICATING EYE DROPS OP) Place 1-2 drops into both eyes 2 (two) times daily as needed (for dry eyes).     Marland Kitchen diltiazem (CARDIZEM) 60 MG tablet Take 1 tablet (60 mg total) by mouth 2 (two) times daily. (May take an extra 30mg  in the evening as needed)  Med increased 10/26/2017.   PLACE ON FILE 75 tablet 6  . doxazosin (CARDURA) 8 MG tablet Take 8 mg  by mouth at bedtime.    . fluticasone (FLONASE) 50 MCG/ACT nasal spray Place 2 sprays into both nostrils daily as needed for allergies or rhinitis.     Marland Kitchen labetalol (NORMODYNE) 200 MG tablet Take 200 mg by mouth 2 (two) times daily.    Marland Kitchen levothyroxine (SYNTHROID, LEVOTHROID) 150 MCG tablet Take 150 mcg daily before breakfast by mouth.    Marland Kitchen lisinopril (PRINIVIL,ZESTRIL) 40 MG tablet Take 40 mg by mouth daily.    . Menthol, Topical Analgesic, (BLUE-EMU MAXIMUM STRENGTH EX) Apply 1 application topically 3 (three) times daily as needed (for back pain.).    Marland Kitchen omeprazole (PRILOSEC) 20 MG capsule Take 20 mg by mouth daily.    Marland Kitchen oxyCODONE (OXY IR/ROXICODONE) 5 MG immediate release tablet Take 5 mg 2 (two) times daily as needed by mouth for severe pain.    Marland Kitchen labetalol (NORMODYNE) 200 MG tablet Take 1.5 tablets (300 mg total) by mouth 2 (two) times daily. (Patient taking differently: Take 200 mg by mouth 2 (two) times daily. ) 270 tablet 0   Facility-Administered Medications Prior to Visit  Medication Dose Route Frequency Provider Last Rate Last Dose  . clopidogrel (PLAVIX) tablet 75 mg  75 mg Oral 60 min Pre-Op Monia Sabal, PA-C         Allergies:   Tramadol   Social History   Socioeconomic History  . Marital status: Married    Spouse name: Not on file  . Number of children: Not on file  . Years of education: Not on file  . Highest education level: Not on file  Occupational History  . Not on file  Social Needs  . Financial resource strain: Not on file  . Food insecurity:    Worry: Not on file    Inability: Not on file  . Transportation needs:    Medical: Not on file    Non-medical: Not on file  Tobacco Use  . Smoking status: Former Smoker    Years: 40.00    Types: Cigarettes    Start date: 10/17/1965    Last attempt to quit: 10/17/1978    Years since quitting: 39.3  . Smokeless tobacco: Never Used  Substance and Sexual Activity  . Alcohol use: No    Alcohol/week: 0.0 oz  . Drug  use: No  . Sexual activity: Yes    Birth control/protection: None  Lifestyle  . Physical activity:    Days per week: Not on file    Minutes per session: Not on file  . Stress: Not on file  Relationships  . Social connections:    Talks on phone: Not on file    Gets together: Not on file    Attends religious service: Not on file  Active member of club or organization: Not on file    Attends meetings of clubs or organizations: Not on file    Relationship status: Not on file  Other Topics Concern  . Not on file  Social History Narrative  . Not on file     Family History:  The patient's family history includes Coronary artery disease in his mother.   Review of Systems:   Please see the history of present illness.     General:  No chills, fever, night sweats or weight changes.  Cardiovascular:  No chest pain, dyspnea on exertion, edema, orthopnea, palpitations, paroxysmal nocturnal dyspnea. Dermatological: No rash, lesions/masses Respiratory: No cough, dyspnea Urologic: No hematuria, dysuria Abdominal:   No nausea, vomiting, diarrhea, bright red blood per rectum, melena, or hematemesis Neurologic:  No visual changes, wkns, changes in mental status. Positive for headaches.   All other systems reviewed and are otherwise negative except as noted above.   Physical Exam:    VS:  BP 124/70   Pulse 76   Ht 5\' 6"  (1.676 m)   Wt 179 lb (81.2 kg)   SpO2 97%   BMI 28.89 kg/m    General: Well developed, elderly Caucasian male appearing in no acute distress. Head: Normocephalic, atraumatic, sclera non-icteric, no xanthomas, nares are without discharge.  Neck: No carotid bruits. JVD not elevated.  Lungs: Respirations regular and unlabored, without wheezes or rales.  Heart: Irregularly irregular. No S3 or S4.  No murmur, no rubs, or gallops appreciated. Abdomen: Soft, non-tender, non-distended with normoactive bowel sounds. No hepatomegaly. No rebound/guarding. No obvious abdominal  masses. Msk:  Strength and tone appear normal for age. No joint deformities or effusions. Extremities: No clubbing or cyanosis. No lower extremity edema.  Distal pedal pulses are 2+ bilaterally. Neuro: Alert and oriented X 3. Moves all extremities spontaneously. No focal deficits noted. Psych:  Responds to questions appropriately with a normal affect. Skin: No rashes or lesions noted  Wt Readings from Last 3 Encounters:  02/14/18 179 lb (81.2 kg)  01/25/18 180 lb (81.6 kg)  01/02/18 183 lb (83 kg)     Studies/Labs Reviewed:   EKG:  EKG is ordered today.  The ekg ordered today demonstrates atrial fibrillation, heart rate 74, with no acute ST or T wave changes when compared to prior tracings.  Recent Labs: 11/01/2017: ALT 14 01/25/2018: BUN 27; Creatinine, Ser 1.62; Hemoglobin 11.9; Platelets 203; Potassium 4.4; Sodium 138   Lipid Panel No results found for: CHOL, TRIG, HDL, CHOLHDL, VLDL, LDLCALC, LDLDIRECT  Additional studies/ records that were reviewed today include:   Echocardiogram: 11/30/2017 Study Conclusions  - Left ventricle: The cavity size was normal. Wall thickness was   increased increased in a pattern of mild to moderate LVH.   Systolic function was normal. The estimated ejection fraction was   in the range of 55% to 60%. Wall motion was normal; there were no   regional wall motion abnormalities. The study is not technically   sufficient to allow evaluation of LV diastolic function in the   setting of atrial fibrillation/flutter. - Aortic valve: Mildly calcified annulus. Trileaflet. - Mitral valve: Mildly calcified annulus. There was trivial   regurgitation. - Left atrium: The atrium was mildly dilated. - Right atrium: The atrium was mildly dilated. Central venous   pressure (est): 3 mm Hg. - Atrial septum: No defect or patent foramen ovale was identified. - Tricuspid valve: There was mild regurgitation. - Pulmonary arteries: PA peak pressure: 25 mm Hg (  S). -  Pericardium, extracardiac: There was no pericardial effusion.  Assessment:    1. Persistent atrial fibrillation ([place])   2. Current use of long term anticoagulation   3. Essential hypertension   4. Mixed hyperlipidemia   5. Vertebrobasilar artery stenosis      Plan:   In order of problems listed above:  1. Persistent Atrial Fibrillation/ Use of Long-Term Anticoagulation - the patient has a known history of atrial fibrillation and recently underwent DCCV on 01/31/2018 and he was successfully cardioverted to normal sinus rhythm with a synchronized 200J shock. In the interim, he has went back into atrial fibrillation but is overall unaware of the arrhythmia as he reports still feeling "significantly improved" since his last visit.  - we discussed options including starting an antiarrhythmic and the possibility of a repeat DCCV versus a rate-control strategy. He does not wish to be on additional medications and in his asymptomatic state, would agree with a rate-control strategy at this time. Continue Cardizem and Labetalol at current dosing.  - he denies any evidence of active bleeding. Remains on Eliquis for anticoagulation.   2. HTN - BP is well-controlled at 124/70 during today's visit. - continue Cardizem 60mg  BID, Labetalol 200mg  BID, and Lisinopril 40mg  daily.   3. HLD - followed by PCP. Remains on Atorvastatin 10mg  daily.   4. Vertebrobasilar Artery Stenosis - known right-sided stenosis. Being followed by Dr. Estanislado Pandy with anticipated surgery in the near future. I infomred the patient this would have to be at least 4 weeks after his recent DCCV.    Medication Adjustments/Labs and Tests Ordered: Current medicines are reviewed at length with the patient today.  Concerns regarding medicines are outlined above.  Medication changes, Labs and Tests ordered today are listed in the Patient Instructions below. Patient Instructions  Medication Instructions:  Your physician recommends that  you continue on your current medications as directed. Please refer to the Current Medication list given to you today.  Labwork: NONE   Testing/Procedures: NONE   Follow-Up: Your physician recommends that you schedule a follow-up appointment in: 2-3 Months with Dr. Dwana Curd  Any Other Special Instructions Will Be Listed Below (If Applicable).  If you need a refill on your cardiac medications before your next appointment, please call your pharmacy.  Thank you for choosing [facility]!    Signed, Erma Heritage, PA-C  02/14/2018 7:24 PM    [facility] [address] [place], [place] [address] Phone: [phone]

## 2018-02-14 ENCOUNTER — Ambulatory Visit (INDEPENDENT_AMBULATORY_CARE_PROVIDER_SITE_OTHER): Payer: Medicare Other | Admitting: Student

## 2018-02-14 ENCOUNTER — Encounter: Payer: Self-pay | Admitting: Student

## 2018-02-14 VITALS — BP 124/70 | HR 76 | Ht 66.0 in | Wt 179.0 lb

## 2018-02-14 DIAGNOSIS — Z7901 Long term (current) use of anticoagulants: Secondary | ICD-10-CM

## 2018-02-14 DIAGNOSIS — I6509 Occlusion and stenosis of unspecified vertebral artery: Secondary | ICD-10-CM | POA: Diagnosis not present

## 2018-02-14 DIAGNOSIS — E782 Mixed hyperlipidemia: Secondary | ICD-10-CM | POA: Diagnosis not present

## 2018-02-14 DIAGNOSIS — I4819 Other persistent atrial fibrillation: Secondary | ICD-10-CM

## 2018-02-14 DIAGNOSIS — I651 Occlusion and stenosis of basilar artery: Secondary | ICD-10-CM | POA: Diagnosis not present

## 2018-02-14 DIAGNOSIS — I481 Persistent atrial fibrillation: Secondary | ICD-10-CM | POA: Diagnosis not present

## 2018-02-14 DIAGNOSIS — I10 Essential (primary) hypertension: Secondary | ICD-10-CM | POA: Diagnosis not present

## 2018-02-14 NOTE — Patient Instructions (Signed)
Medication Instructions:  Your physician recommends that you continue on your current medications as directed. Please refer to the Current Medication list given to you today.   Labwork: NONE   Testing/Procedures: NONE   Follow-Up: Your physician recommends that you schedule a follow-up appointment in: 2-3 Months with Dr. Dwana Curd   Any Other Special Instructions Will Be Listed Below (If Applicable).     If you need a refill on your cardiac medications before your next appointment, please call your pharmacy.  Thank you for choosing [facility]!

## 2018-02-16 ENCOUNTER — Telehealth: Payer: Self-pay | Admitting: Student

## 2018-02-16 DIAGNOSIS — M48061 Spinal stenosis, lumbar region without neurogenic claudication: Secondary | ICD-10-CM | POA: Diagnosis not present

## 2018-02-16 DIAGNOSIS — M47816 Spondylosis without myelopathy or radiculopathy, lumbar region: Secondary | ICD-10-CM | POA: Diagnosis not present

## 2018-02-16 NOTE — Telephone Encounter (Signed)
Patient says last night his pulse ox read 115 bpm, he felt SOB with his a-fib, feels ok now.Statesthere may be a medicine he can take ?

## 2018-02-16 NOTE — Telephone Encounter (Signed)
   The medications that we briefly discussed were antiarrhythmics. If this was his first episode since last month, would recommend continuing on his current medications for now. If he has a recurrent episode of palpitations or dyspnea, can take an additional Cardizem tablet.   If these episodes continue to occur, would plan to further adjust his regularly scheduled Cardizem dosing prior to starting additional medications.   Signed, Erma Heritage, PA-C 02/16/2018, 12:05 PM

## 2018-02-16 NOTE — Telephone Encounter (Signed)
Patient states that he is having the same symptoms as he did last time he went in to A-fib. Just saw BS on 5/1/tg

## 2018-02-16 NOTE — Telephone Encounter (Signed)
Pt will take extra dose of diltiazem when his heart rate increases.he will watch and call back if sx's continue

## 2018-02-21 NOTE — Progress Notes (Signed)
Established Carotid Patient   History of Present Illness   Scott [name] is a 84 y.o. (07-Nov-1927) male who presents with chief complaint: stable dizziness.  Pt notes no change in sx: gets a little dizzy after taking AM drugs.  Pt doesn't think this affects his ADL though.  He feels his chronic back pain limits his ADL more.  He has had multilevel fusion completed previously.  Previous carotid studies demonstrated: RICA <40% stenosis, LICA <97% stenosis.  Patient has a prior history of dizziness and gait instability.  The patient has never had amaurosis fugax or monocular blindness.  The patient has never had facial drooping or hemiplegia.  The patient has never had receptive or expressive aphasia.  The patient's known vertebral artery disease has been chronically managed by Dr. Estanislado Pandy.  Reported MRA is being arranged for such.  The patient's PMH, PSH, SH, and FamHx were reviewed on 02/28/18 are unchanged from 08/25/17.  Current Outpatient Medications  Medication Sig Dispense Refill  . acetaminophen (TYLENOL) 500 MG tablet Take 1,000 mg by mouth 2 (two) times daily.     Marland Kitchen apixaban (ELIQUIS) 5 MG TABS tablet Take 1 tablet (5 mg total) by mouth 2 (two) times daily. 180 tablet 3  . aspirin EC 81 MG tablet Take 81 mg by mouth at bedtime.     Marland Kitchen atorvastatin (LIPITOR) 10 MG tablet Take 10 mg by mouth every evening.     . Carboxymethylcellul-Glycerin (LUBRICATING EYE DROPS OP) Place 1-2 drops into both eyes 2 (two) times daily as needed (for dry eyes).     Marland Kitchen diltiazem (CARDIZEM) 60 MG tablet Take 1 tablet (60 mg total) by mouth 2 (two) times daily. (May take an extra 30mg  in the evening as needed)  Med increased 10/26/2017.   PLACE ON FILE 75 tablet 6  . doxazosin (CARDURA) 8 MG tablet Take 8 mg by mouth at bedtime.    . fluticasone (FLONASE) 50 MCG/ACT nasal spray Place 2 sprays into both nostrils daily as needed for allergies or rhinitis.     Marland Kitchen labetalol (NORMODYNE) 200 MG tablet Take 200  mg by mouth 2 (two) times daily.    Marland Kitchen levothyroxine (SYNTHROID, LEVOTHROID) 150 MCG tablet Take 150 mcg daily before breakfast by mouth.    Marland Kitchen lisinopril (PRINIVIL,ZESTRIL) 40 MG tablet Take 40 mg by mouth daily.    . Menthol, Topical Analgesic, (BLUE-EMU MAXIMUM STRENGTH EX) Apply 1 application topically 3 (three) times daily as needed (for back pain.).    Marland Kitchen omeprazole (PRILOSEC) 20 MG capsule Take 20 mg by mouth daily.    Marland Kitchen oxyCODONE (OXY IR/ROXICODONE) 5 MG immediate release tablet Take 5 mg 2 (two) times daily as needed by mouth for severe pain.     No current facility-administered medications for this visit.    Facility-Administered Medications Ordered in Other Visits  Medication Dose Route Frequency Provider Last Rate Last Dose  . clopidogrel (PLAVIX) tablet 75 mg  75 mg Oral 60 min Pre-Op Monia Sabal, PA-C        On ROS today: +dizziness, no TIA or CVA sx   Physical Examination   Vitals:   02/28/18 1037 02/28/18 1041  BP: 135/74 123/73  Pulse: 70 67  Resp: 16   Temp: (!) 97.1 F (36.2 C)   TempSrc: Oral   SpO2: 100%   Weight: 177 lb (80.3 kg)   Height: 5\' 6"  (1.676 m)    Body mass index is 28.57 kg/m.  General Alert, O x  3, WD, NAD  Neck Supple, mid-line trachea,    Pulmonary Sym exp, good B air movt, CTA B  Cardiac RRR, Nl S1, S2, no Murmurs, No rubs, No S3,S4  Vascular Vessel Right Left  Radial Palpable Palpable  Brachial Palpable Palpable  Carotid Palpable, No Bruit Palpable, No Bruit  Aorta Not palpable N/A  Femoral Palpable Palpable  Popliteal Not palpable Not palpable  PT Not palpable Not palpable  DP Not palpable Not palpable    Gastro- intestinal soft, non-distended, non-tender to palpation, No guarding or rebound, no HSM, no masses, no CVAT B, No palpable prominent aortic pulse,    Musculo- skeletal M/S 5/5 throughout  , Extremities without ischemic changes, B 1+ edema  Neurologic Cranial nerves 2-12 intact, Pain and light touch intact in  extremities, Motor exam as listed above    Non-Invasive Vascular Imaging   B Carotid Duplex (02/28/2018):   R ICA stenosis:  1-39%  R VA: patent and antegrade  L ICA stenosis:  1-39%  L VA: patent and antegrade   Medical Decision Making   Scott [name] is a 84 y.o. male who presents with: sx R vertebrobasilar stenosis >90%, B CVA, asx ICA stenosis <50%.   Patient is currently under Dr. Arlean Hopping care for the R vertebral artery stenosis.  They elected to manage this medically.    It appears Dr. Estanislado Pandy has taken over surveillance on his vertebral artery, so I will defer to him as I don't do vertebral artery interventions.  Both carotid artery appear to be stable, so will switch to q2 year surveillance.  I discussed in depth with the patient the nature of atherosclerosis, and emphasized the importance of maximal medical management including strict control of blood pressure, blood glucose, and lipid levels, antiplatelet agents, obtaining regular exercise, and cessation of smoking.    The patient is aware that without maximal medical management the underlying atherosclerotic disease process will progress, limiting the benefit of any interventions.  The patient is currently on a statin: Lipitor.   The patient is currently on an anti-platelet: ASA.   Pt is also on Eliquis.  Thank you for allowing [place] to participate in this patient's care.   Adele Barthel, MD, FACS [facility] Office: [phone] Pager: [phone]

## 2018-02-28 ENCOUNTER — Ambulatory Visit (INDEPENDENT_AMBULATORY_CARE_PROVIDER_SITE_OTHER): Payer: Medicare Other | Admitting: Vascular Surgery

## 2018-02-28 ENCOUNTER — Encounter: Payer: Self-pay | Admitting: Vascular Surgery

## 2018-02-28 ENCOUNTER — Other Ambulatory Visit: Payer: Self-pay

## 2018-02-28 ENCOUNTER — Ambulatory Visit (HOSPITAL_COMMUNITY)
Admission: RE | Admit: 2018-02-28 | Discharge: 2018-02-28 | Disposition: A | Discharge status: Home / Self Care | Payer: Medicare Other | Source: Ambulatory Visit | Attending: Vascular Surgery | Admitting: Vascular Surgery

## 2018-02-28 VITALS — BP 123/73 | HR 67 | Temp 97.1°F | Resp 16 | Ht 66.0 in | Wt 177.0 lb

## 2018-02-28 DIAGNOSIS — I739 Peripheral vascular disease, unspecified: Secondary | ICD-10-CM

## 2018-02-28 DIAGNOSIS — I6523 Occlusion and stenosis of bilateral carotid arteries: Secondary | ICD-10-CM

## 2018-02-28 DIAGNOSIS — I779 Disorder of arteries and arterioles, unspecified: Secondary | ICD-10-CM

## 2018-03-22 ENCOUNTER — Telehealth (HOSPITAL_COMMUNITY): Payer: Self-pay

## 2018-03-22 NOTE — Telephone Encounter (Signed)
Called to have pt f/u in 6 months with CTA head/neck, no answer, left vm. AW

## 2018-04-24 ENCOUNTER — Ambulatory Visit: Payer: Medicare Other | Admitting: Cardiovascular Disease

## 2018-04-26 ENCOUNTER — Encounter: Payer: Self-pay | Admitting: Cardiovascular Disease

## 2018-05-18 DIAGNOSIS — M48061 Spinal stenosis, lumbar region without neurogenic claudication: Secondary | ICD-10-CM | POA: Diagnosis not present

## 2018-05-18 DIAGNOSIS — M47816 Spondylosis without myelopathy or radiculopathy, lumbar region: Secondary | ICD-10-CM | POA: Diagnosis not present

## 2018-05-31 DIAGNOSIS — H43813 Vitreous degeneration, bilateral: Secondary | ICD-10-CM | POA: Diagnosis not present

## 2018-05-31 DIAGNOSIS — H353231 Exudative age-related macular degeneration, bilateral, with active choroidal neovascularization: Secondary | ICD-10-CM | POA: Diagnosis not present

## 2018-05-31 DIAGNOSIS — H04123 Dry eye syndrome of bilateral lacrimal glands: Secondary | ICD-10-CM | POA: Diagnosis not present

## 2018-05-31 DIAGNOSIS — H0289 Other specified disorders of eyelid: Secondary | ICD-10-CM | POA: Diagnosis not present

## 2018-06-01 ENCOUNTER — Encounter (INDEPENDENT_AMBULATORY_CARE_PROVIDER_SITE_OTHER): Payer: Medicare Other | Admitting: Ophthalmology

## 2018-06-01 DIAGNOSIS — H43813 Vitreous degeneration, bilateral: Secondary | ICD-10-CM | POA: Diagnosis not present

## 2018-06-01 DIAGNOSIS — I10 Essential (primary) hypertension: Secondary | ICD-10-CM | POA: Diagnosis not present

## 2018-06-01 DIAGNOSIS — H353132 Nonexudative age-related macular degeneration, bilateral, intermediate dry stage: Secondary | ICD-10-CM | POA: Diagnosis not present

## 2018-06-01 DIAGNOSIS — H35033 Hypertensive retinopathy, bilateral: Secondary | ICD-10-CM | POA: Diagnosis not present

## 2018-06-01 DIAGNOSIS — D3131 Benign neoplasm of right choroid: Secondary | ICD-10-CM | POA: Diagnosis not present

## 2018-07-04 ENCOUNTER — Ambulatory Visit (INDEPENDENT_AMBULATORY_CARE_PROVIDER_SITE_OTHER): Payer: Medicare Other | Admitting: Urology

## 2018-07-04 DIAGNOSIS — R351 Nocturia: Secondary | ICD-10-CM

## 2018-07-04 DIAGNOSIS — R3915 Urgency of urination: Secondary | ICD-10-CM | POA: Diagnosis not present

## 2018-07-04 DIAGNOSIS — C61 Malignant neoplasm of prostate: Secondary | ICD-10-CM | POA: Diagnosis not present

## 2018-07-11 DIAGNOSIS — M47816 Spondylosis without myelopathy or radiculopathy, lumbar region: Secondary | ICD-10-CM | POA: Diagnosis not present

## 2018-07-11 DIAGNOSIS — M5417 Radiculopathy, lumbosacral region: Secondary | ICD-10-CM | POA: Diagnosis not present

## 2018-07-11 DIAGNOSIS — M48061 Spinal stenosis, lumbar region without neurogenic claudication: Secondary | ICD-10-CM | POA: Diagnosis not present

## 2018-07-18 ENCOUNTER — Encounter: Payer: Self-pay | Admitting: Cardiovascular Disease

## 2018-07-18 ENCOUNTER — Ambulatory Visit (INDEPENDENT_AMBULATORY_CARE_PROVIDER_SITE_OTHER): Payer: Medicare Other | Admitting: Cardiovascular Disease

## 2018-07-18 VITALS — BP 100/64 | HR 85 | Ht 66.0 in | Wt 177.0 lb

## 2018-07-18 DIAGNOSIS — I10 Essential (primary) hypertension: Secondary | ICD-10-CM

## 2018-07-18 DIAGNOSIS — Z7901 Long term (current) use of anticoagulants: Secondary | ICD-10-CM

## 2018-07-18 DIAGNOSIS — I651 Occlusion and stenosis of basilar artery: Secondary | ICD-10-CM

## 2018-07-18 DIAGNOSIS — I6509 Occlusion and stenosis of unspecified vertebral artery: Secondary | ICD-10-CM

## 2018-07-18 DIAGNOSIS — E782 Mixed hyperlipidemia: Secondary | ICD-10-CM

## 2018-07-18 DIAGNOSIS — I4819 Other persistent atrial fibrillation: Secondary | ICD-10-CM | POA: Diagnosis not present

## 2018-07-18 DIAGNOSIS — Z23 Encounter for immunization: Secondary | ICD-10-CM

## 2018-07-18 DIAGNOSIS — I6523 Occlusion and stenosis of bilateral carotid arteries: Secondary | ICD-10-CM

## 2018-07-18 NOTE — Addendum Note (Signed)
Addended by: Barbarann Ehlers A on: 07/18/2018 12:53 PM   Modules accepted: Orders

## 2018-07-18 NOTE — Addendum Note (Signed)
Addended by: Barbarann Ehlers A on: 07/18/2018 12:55 PM   Modules accepted: Orders

## 2018-07-18 NOTE — Progress Notes (Signed)
SUBJECTIVE: The patient presents for follow-up of persistent atrial fibrillation.  He underwent direct-current cardioversion earlier this year.  He subsequently went back into atrial fibrillation.  A discussion was previously held with him about initiating antiarrhythmic therapy and the possibility of repeat direct-current cardioversion but the patient preferred a rate control strategy.  He is doing fairly well overall from a cardiac perspective.  He seldom has palpitations.  He denies shortness of breath and chest pain.  His primary complaints relate to chronic back pain.  He is soon to see a pain management specialist and receive an injection.  If he takes diltiazem shortly after waking up he feels drowsy but if he takes it at around 9 or 10 AM it does not seem to bother him.  He checks his blood pressure and heart rate routinely and overall vital signs appear to be stable.    Review of Systems: As per "subjective", otherwise negative.  Allergies  Allergen Reactions  . Tramadol Nausea Only    Current Outpatient Medications  Medication Sig Dispense Refill  . acetaminophen (TYLENOL) 500 MG tablet Take 1,000 mg by mouth 2 (two) times daily.     Marland Kitchen apixaban (ELIQUIS) 5 MG TABS tablet Take 1 tablet (5 mg total) by mouth 2 (two) times daily. 180 tablet 3  . aspirin EC 81 MG tablet Take 81 mg by mouth at bedtime.     Marland Kitchen atorvastatin (LIPITOR) 10 MG tablet Take 10 mg by mouth every evening.     . Carboxymethylcellul-Glycerin (LUBRICATING EYE DROPS OP) Place 1-2 drops into both eyes 2 (two) times daily as needed (for dry eyes).     Marland Kitchen diltiazem (CARDIZEM) 60 MG tablet Take 1 tablet (60 mg total) by mouth 2 (two) times daily. (May take an extra 30mg  in the evening as needed)  Med increased 10/26/2017.   PLACE ON FILE 75 tablet 6  . doxazosin (CARDURA) 8 MG tablet Take 8 mg by mouth at bedtime.    . fluticasone (FLONASE) 50 MCG/ACT nasal spray Place 2 sprays into both nostrils daily as needed  for allergies or rhinitis.     Marland Kitchen labetalol (NORMODYNE) 200 MG tablet Take 200 mg by mouth 2 (two) times daily.    Marland Kitchen levothyroxine (SYNTHROID, LEVOTHROID) 150 MCG tablet Take 150 mcg daily before breakfast by mouth.    Marland Kitchen lisinopril (PRINIVIL,ZESTRIL) 40 MG tablet Take 40 mg by mouth daily.    . Menthol, Topical Analgesic, (BLUE-EMU MAXIMUM STRENGTH EX) Apply 1 application topically 3 (three) times daily as needed (for back pain.).    Marland Kitchen omeprazole (PRILOSEC) 20 MG capsule Take 20 mg by mouth daily.    Marland Kitchen oxyCODONE (OXY IR/ROXICODONE) 5 MG immediate release tablet Take 5 mg 2 (two) times daily as needed by mouth for severe pain.     No current facility-administered medications for this visit.    Facility-Administered Medications Ordered in Other Visits  Medication Dose Route Frequency Provider Last Rate Last Dose  . clopidogrel (PLAVIX) tablet 75 mg  75 mg Oral 60 min Pre-Op Monia Sabal, PA-C        Past Medical History:  Diagnosis Date  . Anxiety 10/31/2014   due to upcoming procedure  . Arrhythmia   . Arthritis   . Cancer ([place])    Melonoma on nose- removed  . Chronic kidney disease   . Dyspnea   . Dysrhythmia    afib  . Enlarged prostate   . GERD (gastroesophageal reflux disease)   .  Headache   . Hyperlipidemia   . Hypertension   . Hypothyroidism   . Overactive bladder   . Stroke [facility])    TIA - "nothing taste right anymore, right eye pain  . TIA (transient ischemic attack)   . Wears glasses     Past Surgical History:  Procedure Laterality Date  . BACK SURGERY  08/2013   x2  . CARDIOVERSION N/A 01/31/2018   Procedure: CARDIOVERSION;  Surgeon: Arnoldo Lenis, MD;  Location[facility];  Service: Endoscopy;  Laterality: N/A;  . CATARACT EXTRACTION    . COLONOSCOPY    . EYE SURGERY Bilateral    cataract  . IR ANGIO INTRA EXTRACRAN SEL COM CAROTID INNOMINATE BILAT MOD SED  08/04/2017  . IR ANGIO VERTEBRAL SEL VERTEBRAL BILAT MOD SED  08/04/2017  . IR ANGIO  VERTEBRAL SEL VERTEBRAL UNI R MOD SED  11/01/2017  . IR RADIOLOGIST EVAL & MGMT  07/28/2017  . IR RADIOLOGIST EVAL & MGMT  08/22/2017  . IR RADIOLOGIST EVAL & MGMT  11/29/2017  . JOINT REPLACEMENT Left 10/2015   hip  . KNEE ARTHROPLASTY Left 1999  . KNEE ARTHROPLASTY Right 2008  . LUMBAR FUSION  06/2014  . RADIOLOGY WITH ANESTHESIA N/A 11/01/2017   Procedure: STENTING;  Surgeon: Luanne Bras, MD;  Location: [place] [facility];  Service: Radiology;  Laterality: N/A;  . TRANSURETHRAL RESECTION OF PROSTATE  03/2015  . TRIGGER FINGER RELEASE Right    3rd and 4th    Social History   Socioeconomic History  . Marital status: Married    Spouse name: Not on file  . Number of children: Not on file  . Years of education: Not on file  . Highest education level: Not on file  Occupational History  . Not on file  Social Needs  . Financial resource strain: Not on file  . Food insecurity:    Worry: Not on file    Inability: Not on file  . Transportation needs:    Medical: Not on file    Non-medical: Not on file  Tobacco Use  . Smoking status: Former Smoker    Years: 40.00    Types: Cigarettes    Start date: 10/17/1965    Last attempt to quit: 10/17/1978    Years since quitting: 39.7  . Smokeless tobacco: Never Used  Substance and Sexual Activity  . Alcohol use: No    Alcohol/week: 0.0 standard drinks  . Drug use: No  . Sexual activity: Yes    Birth control/protection: None  Lifestyle  . Physical activity:    Days per week: Not on file    Minutes per session: Not on file  . Stress: Not on file  Relationships  . Social connections:    Talks on phone: Not on file    Gets together: Not on file    Attends religious service: Not on file    Active member of club or organization: Not on file    Attends meetings of clubs or organizations: Not on file    Relationship status: Not on file  . Intimate partner violence:    Fear of current or ex partner: Not on file    Emotionally abused: Not on file     Physically abused: Not on file    Forced sexual activity: Not on file  Other Topics Concern  . Not on file  Social History Narrative  . Not on file     Vitals:   07/18/18 1121  BP: 100/64  Pulse: 85  SpO2: 93%  Weight: 177 lb (80.3 kg)  Height: 5\' 6"  (1.676 m)    Wt Readings from Last 3 Encounters:  07/18/18 177 lb (80.3 kg)  02/28/18 177 lb (80.3 kg)  02/14/18 179 lb (81.2 kg)     PHYSICAL EXAM General: NAD HEENT: Normal. Neck: No JVD, no thyromegaly. Lungs: Clear to auscultation bilaterally with normal respiratory effort. CV: Regular rate and irregular rhythm, normal S1/S2, no S3, no murmur. No pretibial or periankle edema.  No carotid bruit.   Abdomen: Soft, nontender, no distention.  Neurologic: Alert and oriented.  Psych: Normal affect. Skin: Normal. Musculoskeletal: No gross deformities.    ECG: Reviewed above under Subjective   Labs: Lab Results  Component Value Date/Time   K 4.4 01/25/2018 01:50 PM   BUN 27 (H) 01/25/2018 01:50 PM   CREATININE 1.62 (H) 01/25/2018 01:50 PM   ALT 14 (L) 11/01/2017 06:22 AM   HGB 11.9 (L) 01/25/2018 01:50 PM     Lipids: No results found for: LDLCALC, LDLDIRECT, CHOL, TRIG, HDL     ASSESSMENT AND PLAN:  1.  Persistent atrial fibrillation: Symptomatically stable.  He underwent direct-current cardioversion on 01/31/2018.  He subsequently went back into atrial fibrillation.  A discussion was previously held with him about initiating antiarrhythmic therapy and the possibility of repeat direct-current cardioversion but the patient preferred a rate control strategy which I feel is prudent.  Continue diltiazem and labetalol at current dosing.  He remains on apixaban for systemic anticoagulation.  2.  Hypertension: Blood pressure is normal.  No change to therapy.  3.  Hyperlipidemia: Remains on atorvastatin 10 mg daily.  No changes.  4.  Vertebrobasilar artery stenosis: He has known right-sided stenosis and this is  followed by Dr. Estanislado Pandy.   Disposition: Follow up 6 months   Kate Sable, M.D., F.A.C.C.

## 2018-07-18 NOTE — Patient Instructions (Addendum)
Your physician wants you to follow-up in: 6 months  with [place] PA-C You will receive a reminder letter in the mail two months in advance. If you don't receive a letter, please call our office to schedule the follow-up appointment.     No labs or tests ordered today.     Your physician recommends that you continue on your current medications as directed. Please refer to the Current Medication list given to you today.    If you need a refill on your cardiac medications before your next appointment, please call your pharmacy.         Thank you for choosing [facility] !

## 2018-07-24 DIAGNOSIS — M47817 Spondylosis without myelopathy or radiculopathy, lumbosacral region: Secondary | ICD-10-CM | POA: Diagnosis not present

## 2018-07-24 DIAGNOSIS — M5417 Radiculopathy, lumbosacral region: Secondary | ICD-10-CM | POA: Diagnosis not present

## 2018-07-25 IMAGING — US US PELVIS LIMITED
1 series · 7 of 7 positions shown · non-contrast
Comparison: None.

CLINICAL DATA: 89-year-old male with palpable abnormality in the
right groin. Evaluate for potential hematoma or arteriovenous
fistula.

EXAM:
LIMITED ULTRASOUND OF PELVIS
TECHNIQUE: Limited transabdominal ultrasound examination of the pelvis was
performed.

[Series 1: us pelvis limited · 0.07mm/px · 7 of 7 slices shown]
[im 1/7]
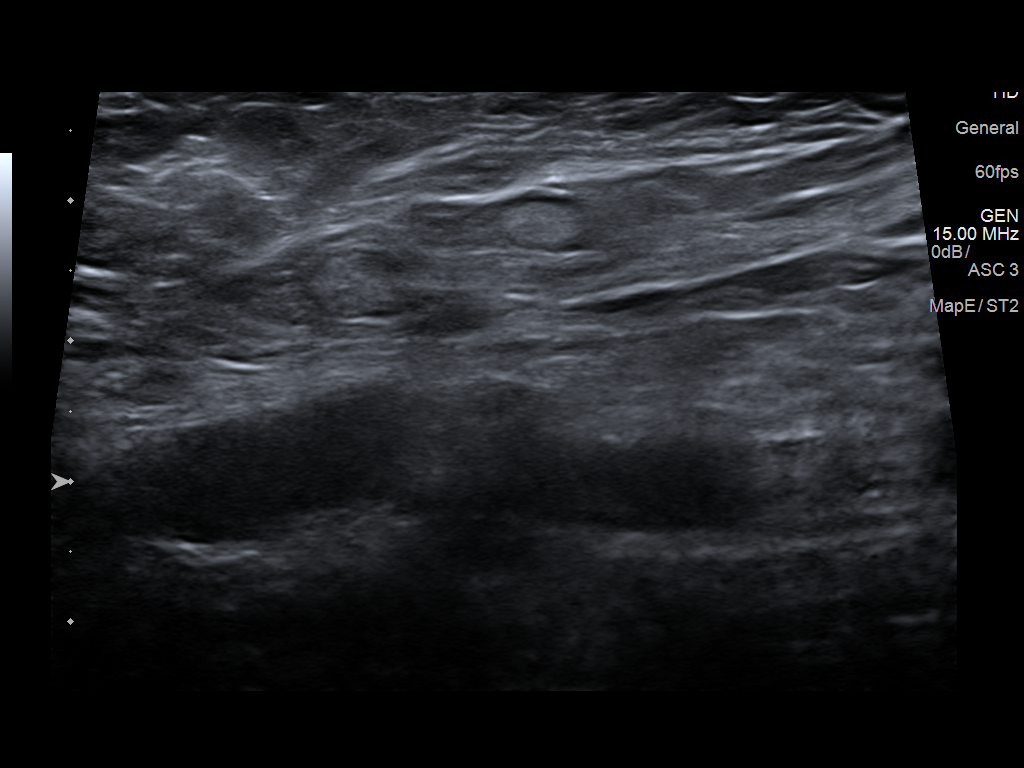
[im 2/7]
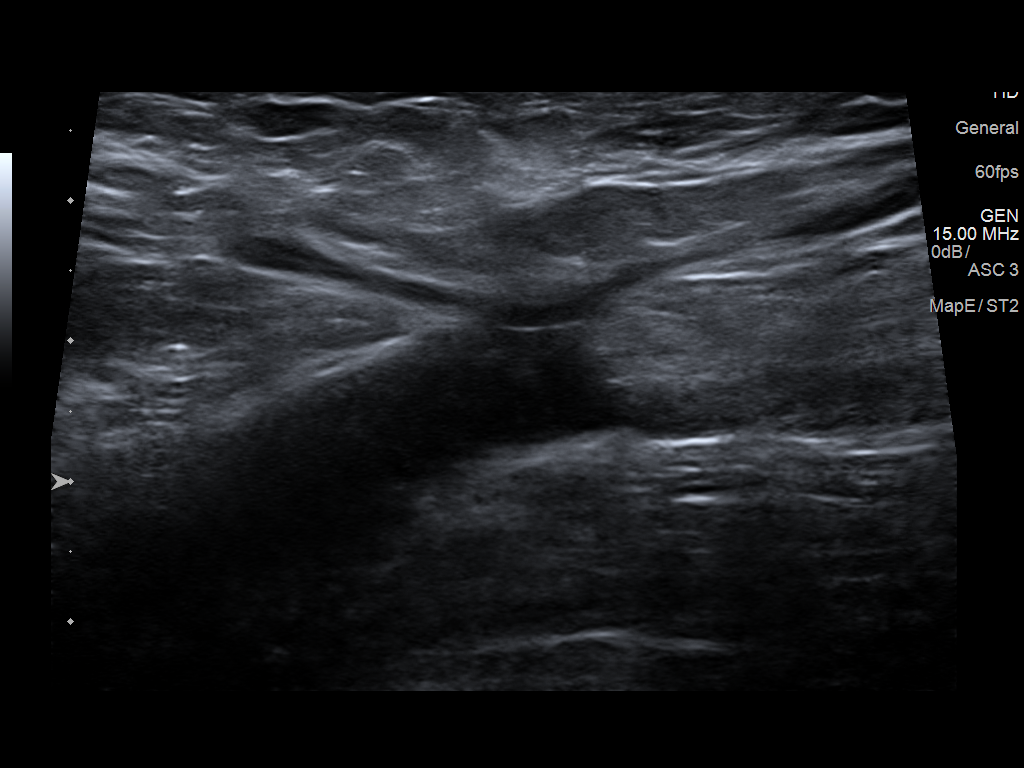
[im 3/7]
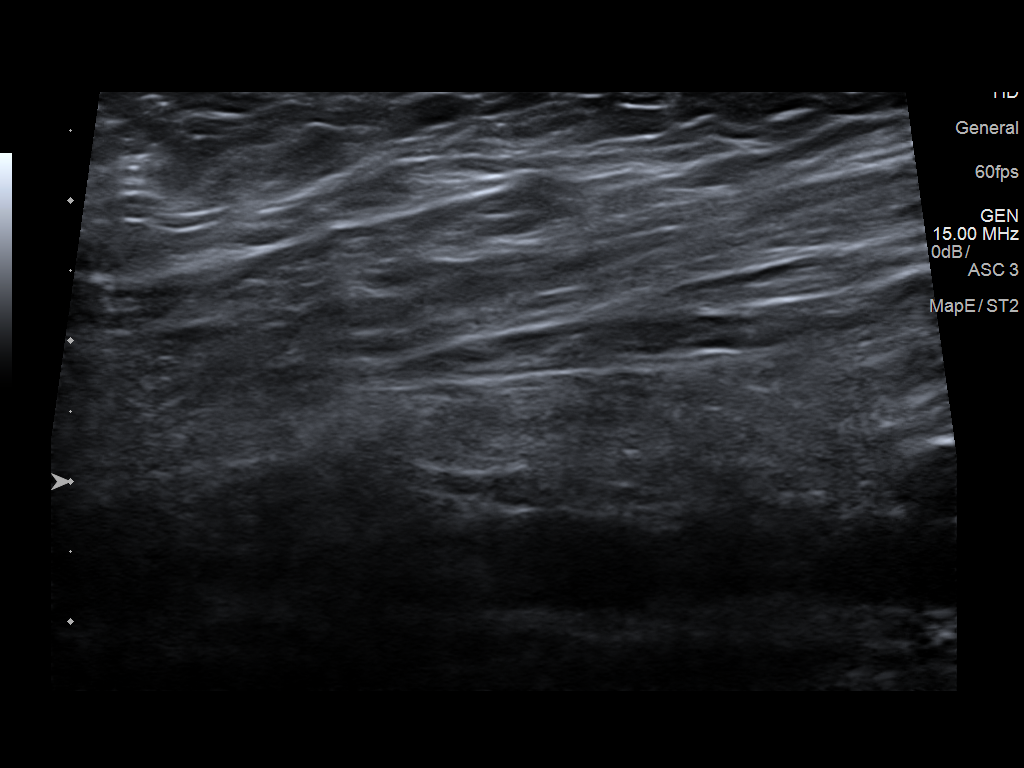
[im 4/7]
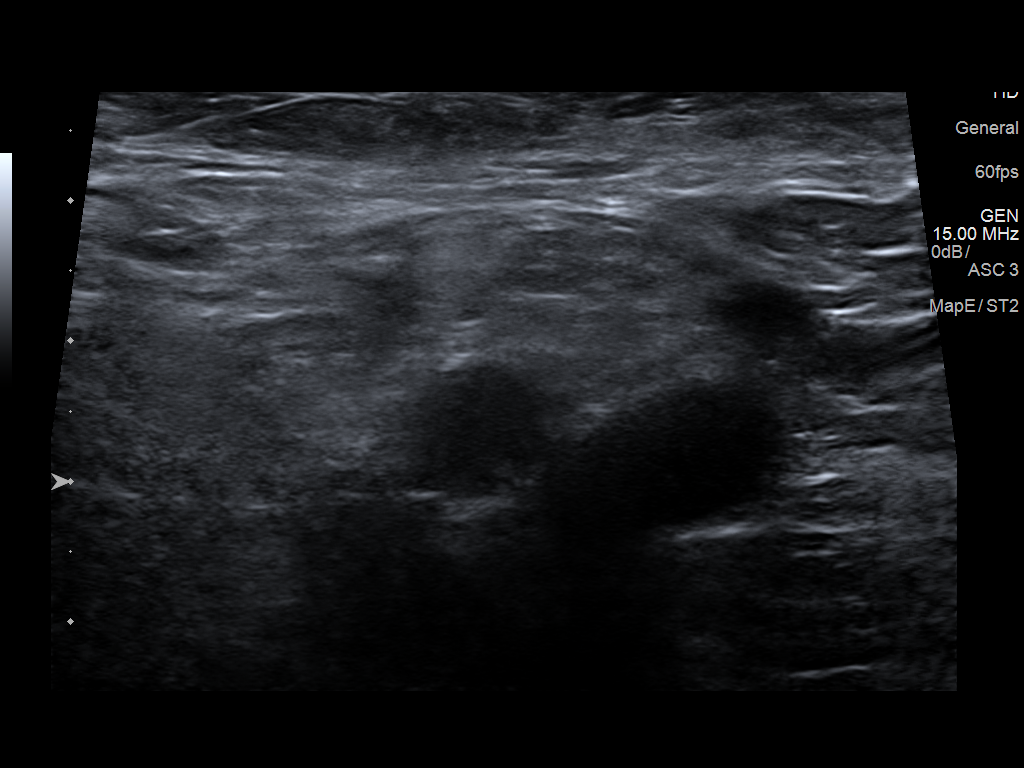
[im 5/7]
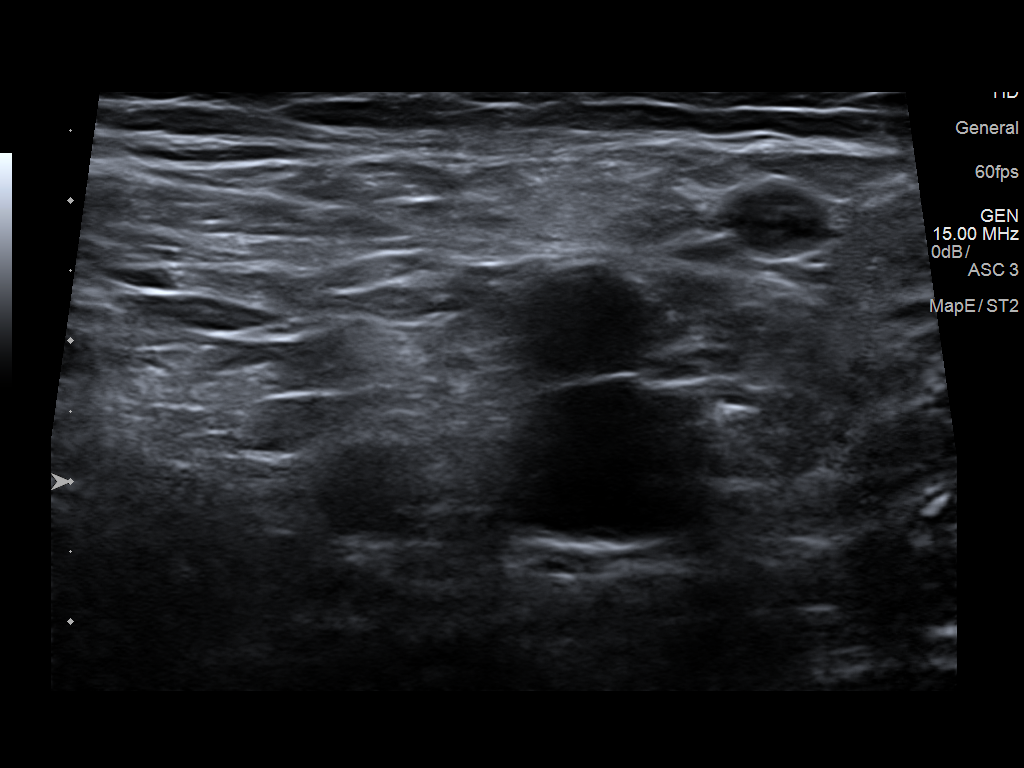
[im 6/7]
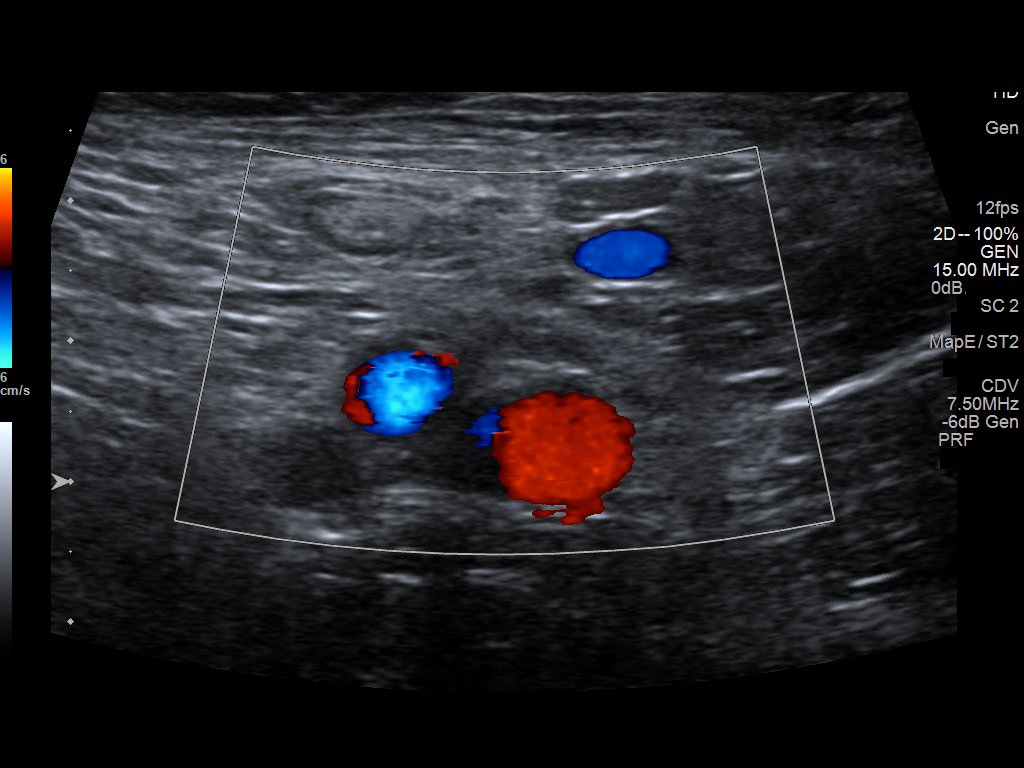
[im 7/7]
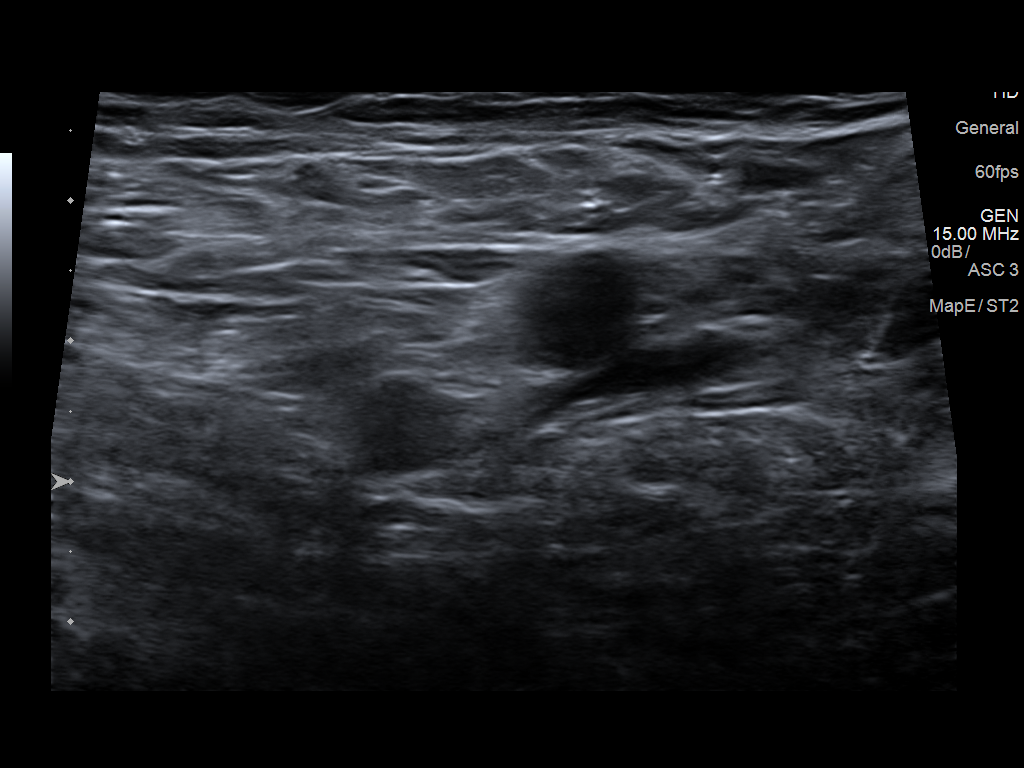

[7 of 7 positions shown; findings below may reference images not displayed]

FINDINGS: No ultrasound correlate to correspond to the palpable abnormality in
the right groin. Specifically, no evidence of hematoma,
pseudoaneurysm or arteriovenous fistula. Several normal-appearing
lymph nodes are incidentally noted.
IMPRESSION: 1. Unremarkable right groin ultrasound. Specifically, no hematoma,
pseudoaneurysm or arteriovenous fistula noted.

## 2018-08-24 ENCOUNTER — Telehealth: Payer: Self-pay | Admitting: Cardiovascular Disease

## 2018-08-24 NOTE — Telephone Encounter (Signed)
Correct

## 2018-08-24 NOTE — Telephone Encounter (Signed)
Detailed message regarding Eliquis left on dentist office VM

## 2018-08-24 NOTE — Telephone Encounter (Signed)
Patient walked into the office stating that he is suppose to have his teeth cleaned on Tuesday  08-28-18. He is on Eliquis. [phone] (DENTIST)

## 2018-08-24 NOTE — Telephone Encounter (Signed)
Pt doesn't need to hold eliquis to have routine teeth cleaning correct?

## 2018-08-24 NOTE — Telephone Encounter (Signed)
LM on dentist office VM

## 2018-08-27 DIAGNOSIS — M47816 Spondylosis without myelopathy or radiculopathy, lumbar region: Secondary | ICD-10-CM | POA: Diagnosis not present

## 2018-08-27 DIAGNOSIS — M5417 Radiculopathy, lumbosacral region: Secondary | ICD-10-CM | POA: Diagnosis not present

## 2018-08-31 ENCOUNTER — Other Ambulatory Visit: Payer: Self-pay | Admitting: Cardiovascular Disease

## 2018-08-31 MED ORDER — APIXABAN 5 MG PO TABS: 5.0000 mg | ORAL_TABLET | Freq: Two times a day (BID) | ORAL | 0 refills | Status: DC

## 2018-08-31 NOTE — Telephone Encounter (Signed)
Patient called stating that he needs to have his apixaban (ELIQUIS) 5 MG TABS filled. He states that he is waiting on a mail order. He is requesting 4 pills to be [place], [place] [place]

## 2018-09-04 ENCOUNTER — Other Ambulatory Visit: Payer: Self-pay

## 2018-09-04 DIAGNOSIS — R262 Difficulty in walking, not elsewhere classified: Secondary | ICD-10-CM | POA: Diagnosis not present

## 2018-09-04 DIAGNOSIS — Z96653 Presence of artificial knee joint, bilateral: Secondary | ICD-10-CM | POA: Diagnosis not present

## 2018-09-04 DIAGNOSIS — M25662 Stiffness of left knee, not elsewhere classified: Secondary | ICD-10-CM | POA: Diagnosis not present

## 2018-09-04 DIAGNOSIS — S330XXS Traumatic rupture of lumbar intervertebral disc, sequela: Secondary | ICD-10-CM | POA: Diagnosis not present

## 2018-09-04 DIAGNOSIS — M25661 Stiffness of right knee, not elsewhere classified: Secondary | ICD-10-CM | POA: Diagnosis not present

## 2018-09-04 DIAGNOSIS — M6281 Muscle weakness (generalized): Secondary | ICD-10-CM | POA: Diagnosis not present

## 2018-09-05 ENCOUNTER — Telehealth (HOSPITAL_COMMUNITY): Payer: Self-pay

## 2018-09-05 NOTE — Telephone Encounter (Signed)
Called to schedule f/u cta, no answer, left vm. AW  

## 2018-09-06 ENCOUNTER — Other Ambulatory Visit (HOSPITAL_COMMUNITY): Payer: Self-pay | Admitting: Interventional Radiology

## 2018-09-06 DIAGNOSIS — I771 Stricture of artery: Secondary | ICD-10-CM

## 2018-09-07 DIAGNOSIS — S330XXS Traumatic rupture of lumbar intervertebral disc, sequela: Secondary | ICD-10-CM | POA: Diagnosis not present

## 2018-09-07 DIAGNOSIS — Z96653 Presence of artificial knee joint, bilateral: Secondary | ICD-10-CM | POA: Diagnosis not present

## 2018-09-07 DIAGNOSIS — R262 Difficulty in walking, not elsewhere classified: Secondary | ICD-10-CM | POA: Diagnosis not present

## 2018-09-07 DIAGNOSIS — M25662 Stiffness of left knee, not elsewhere classified: Secondary | ICD-10-CM | POA: Diagnosis not present

## 2018-09-07 DIAGNOSIS — M25661 Stiffness of right knee, not elsewhere classified: Secondary | ICD-10-CM | POA: Diagnosis not present

## 2018-09-07 DIAGNOSIS — M6281 Muscle weakness (generalized): Secondary | ICD-10-CM | POA: Diagnosis not present

## 2018-09-14 DIAGNOSIS — R262 Difficulty in walking, not elsewhere classified: Secondary | ICD-10-CM | POA: Diagnosis not present

## 2018-09-14 DIAGNOSIS — Z96653 Presence of artificial knee joint, bilateral: Secondary | ICD-10-CM | POA: Diagnosis not present

## 2018-09-14 DIAGNOSIS — M25661 Stiffness of right knee, not elsewhere classified: Secondary | ICD-10-CM | POA: Diagnosis not present

## 2018-09-14 DIAGNOSIS — M25662 Stiffness of left knee, not elsewhere classified: Secondary | ICD-10-CM | POA: Diagnosis not present

## 2018-09-14 DIAGNOSIS — M6281 Muscle weakness (generalized): Secondary | ICD-10-CM | POA: Diagnosis not present

## 2018-09-14 DIAGNOSIS — S330XXS Traumatic rupture of lumbar intervertebral disc, sequela: Secondary | ICD-10-CM | POA: Diagnosis not present

## 2018-09-18 ENCOUNTER — Ambulatory Visit (HOSPITAL_COMMUNITY)
Admission: RE | Admit: 2018-09-18 | Discharge: 2018-09-18 | Disposition: A | Discharge status: Home / Self Care | Payer: Medicare Other | Source: Ambulatory Visit | Attending: Interventional Radiology | Admitting: Interventional Radiology

## 2018-09-18 ENCOUNTER — Ambulatory Visit (HOSPITAL_COMMUNITY): Payer: Medicare Other

## 2018-09-18 DIAGNOSIS — I6523 Occlusion and stenosis of bilateral carotid arteries: Secondary | ICD-10-CM | POA: Diagnosis not present

## 2018-09-18 DIAGNOSIS — I771 Stricture of artery: Secondary | ICD-10-CM | POA: Diagnosis not present

## 2018-09-18 DIAGNOSIS — I6501 Occlusion and stenosis of right vertebral artery: Secondary | ICD-10-CM | POA: Diagnosis not present

## 2018-09-18 LAB — POCT I-STAT CREATININE: CREATININE: 1.4 mg/dL — AB (ref 0.61–1.24)

## 2018-09-18 IMAGING — CT CT ANGIO NECK
1 of 13 series · 6 of 46 positions shown, 11 images · IV contrast (OMNI)
Comparison: Angiography [DATE].

CLINICAL DATA: History of posterior circulation ischemic events.
Right vertebrobasilar junction stenosis.

EXAM:
CT ANGIOGRAPHY HEAD AND NECK
TECHNIQUE: Multidetector CT imaging of the head and neck was performed using
the standard protocol during bolus administration of intravenous
contrast. Multiplanar CT image reconstructions and MIPs were
obtained to evaluate the vascular anatomy. Carotid stenosis
measurements (when applicable) are obtained utilizing NASCET
criteria, using the distal internal carotid diameter as the
denominator.
CONTRAST:  50mL [69] IOPAMIDOL ([69]) INJECTION 76%

[Series 10: carotid/brain 2.0 i30f 3 · axial · 0.50mm/px · z∈[-263,+17]mm · 6 of 197 slices shown, 11 images]
[im 29/197  soft-tissue]
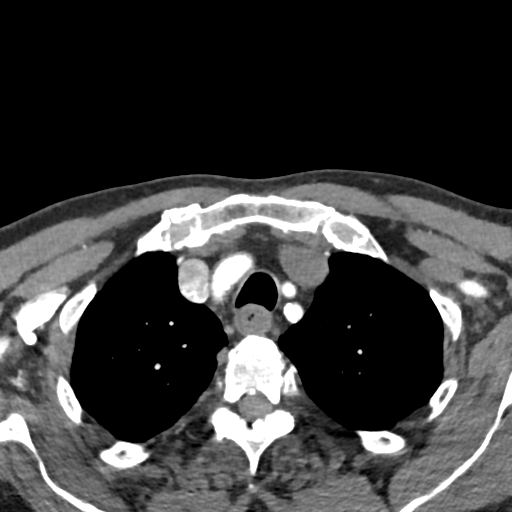
[im 29/197  bone]
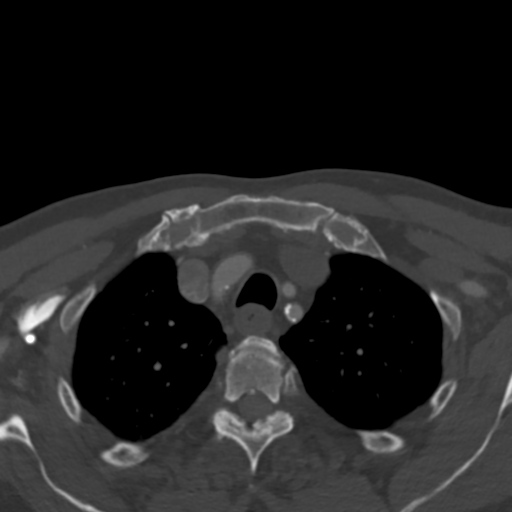
[im 57/197  soft-tissue]
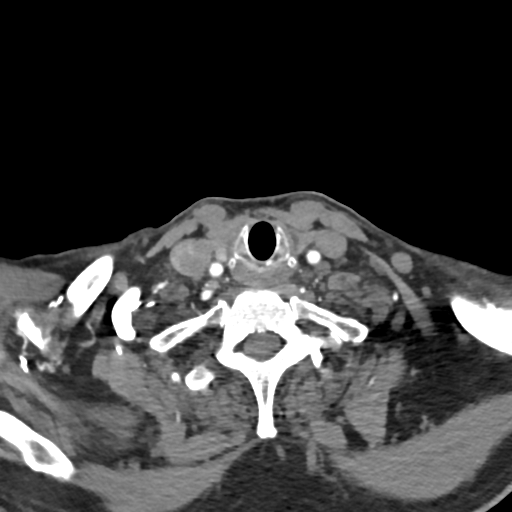
[im 85/197  soft-tissue]
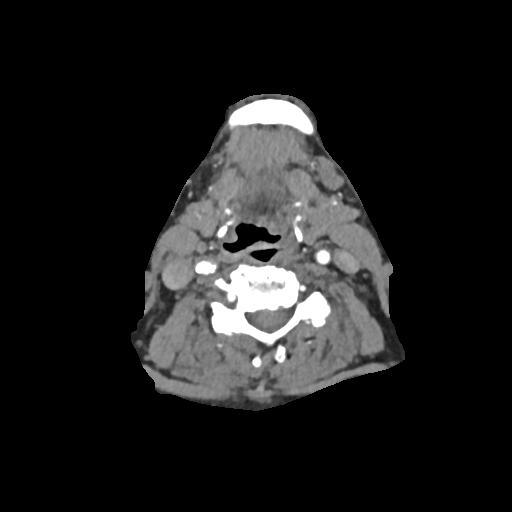
[im 85/197  lung]
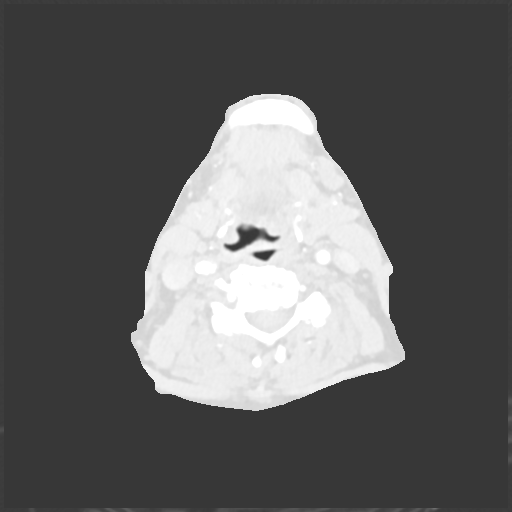
[im 113/197  soft-tissue]
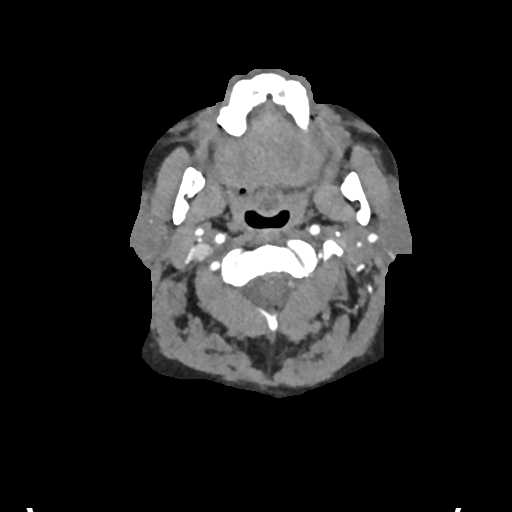
[im 113/197  lung]
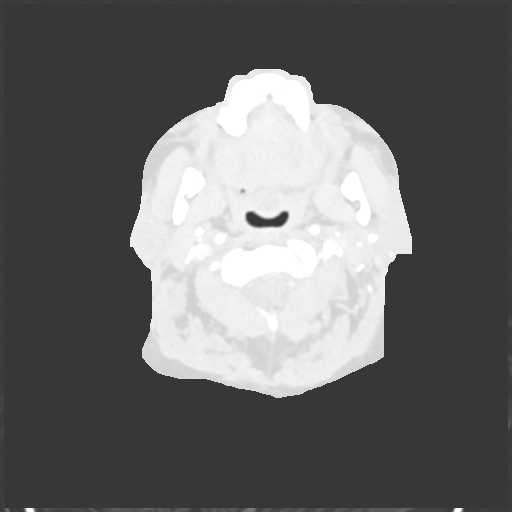
[im 141/197  soft-tissue]
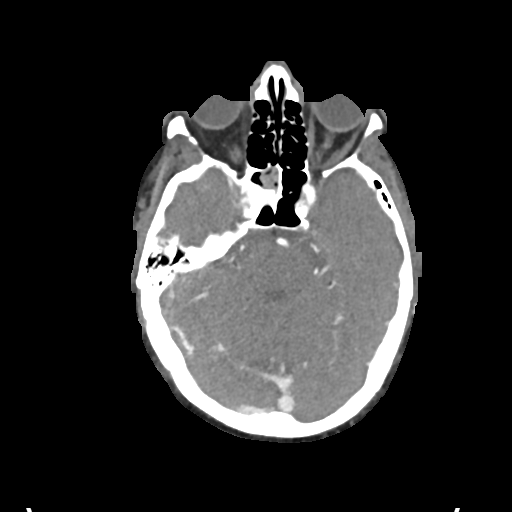
[im 141/197  lung]
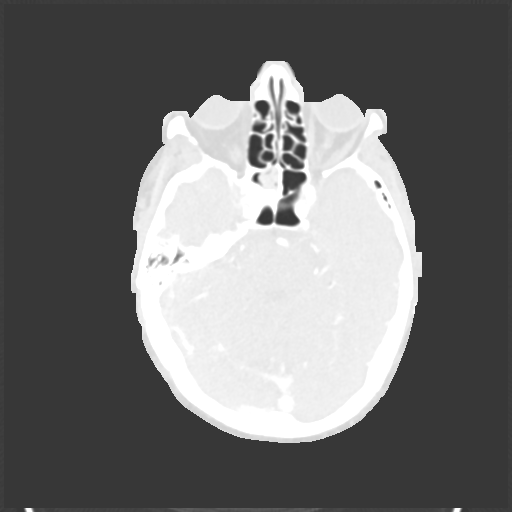
[im 169/197  soft-tissue]
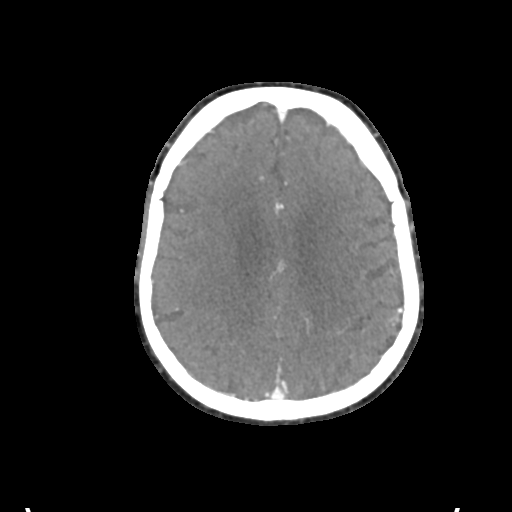
[im 169/197  lung]
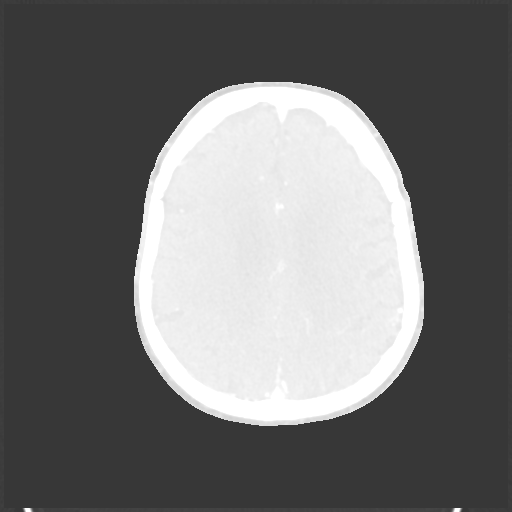

[6 of 46 positions shown; findings below may reference images not displayed]

FINDINGS: CT HEAD FINDINGS

Brain: Chronic atrophy. Chronic small-vessel ischemic changes
throughout the cerebral hemispheric white matter. Old left thalamic
infarction. Scattered small vessel cerebellar infarctions. No sign
of acute infarction, mass lesion, hemorrhage, hydrocephalus or
extra-axial collection.

Vascular: There is atherosclerotic calcification of the major
vessels at the base of the brain.

Skull: Negative

Sinuses: Mucosal thickening or polyp in the right sphenoid sinus.
Other sinuses clear.

Orbits: Negative

Review of the MIP images confirms the above findings

CTA NECK FINDINGS

Aortic arch: Extensive aortic atherosclerosis. No aneurysm or
dissection. Branching pattern of the brachiocephalic vessels is
normal without origin stenosis.

Right carotid system: Common carotid artery shows some
atherosclerotic plaque. Narrowing 1 cm proximal to the bifurcation
shows stenosis of 40%. At the carotid bifurcation, there is
extensive calcified and soft plaque. Minimal measured diameter is
1.5 mm at the proximal ICA. Compared to a more distal cervical ICA
diameter of 5 mm, this indicates a 70% stenosis.

Left carotid system: Common carotid artery shows some scattered
plaque but is widely patent to the bifurcation region. There is soft
and calcified plaque at the carotid bifurcation and proximal ICA.
Minimal diameter in the ICA bulb is 3 mm. Compared to a more distal
cervical ICA diameter of 5 mm, this indicates a 40% stenosis.

Vertebral arteries: Right vertebral artery is dominant. No
subclavian stenosis proximal to the vertebral artery origin. Severe
stenosis at the right vertebral artery origin, estimated at 70% or
greater. Beyond that, the vessel is widely patent through the
cervical region to the foramen magnum. On the left, subclavian
artery shows atherosclerotic disease proximal to the vertebral
artery origin with stenosis of 50%. There is calcified plaque at the
vertebral artery origin with stenosis estimated at 50%. Beyond that,
the small vessel is patent through the cervical region to the
foramen magnum.

Skeleton: Ordinary cervical spondylosis.  No acute bone finding.

Other neck: No soft tissue mass or lymphadenopathy.

Upper chest: Negative

Review of the MIP images confirms the above findings

CTA HEAD FINDINGS

Anterior circulation: Both internal carotid arteries are patent
through the skull base and siphon regions. There is calcification
within the carotid siphon regions but without stenosis of more than
30% suspected. The anterior and middle cerebral vessels are patent
but there is no correctable or flow limiting proximal stenosis. More
distal branch vessels show some atherosclerotic irregularity.

Posterior circulation: The right vertebral artery shows some
narrowing and irregularity as it passes through the dura. Stenosis
of 50% estimated at that level. In the V4 segment, there is
calcified plaque with severe stenosis. Difficult to accurately
evaluate the vessel lumen, but it is probably less than 1 mm. Beyond
that, the distal vertebral artery gives the majority of the supply
to the basilar. The basilar is tortuous and show some
atherosclerotic irregularity but no measurable stenosis. The small
left vertebral artery supplies PICA and then gives a tiny component
to the basilar. Superior cerebellar and posterior cerebral arteries
are patent, showing some atherosclerotic irregularity of the more
distal branch vessels.

Venous sinuses: Patent and normal.

Anatomic variants: None significant.

Delayed phase: No abnormal enhancement.

Review of the MIP images confirms the above findings
IMPRESSION: Advanced atherosclerosis of the abdominal aorta.

Severe stenosis of the right vertebral artery origin estimated at
70% or greater. Irregular stenosis of the right vertebral artery as
it pierces the dura, estimated at 50%. Severe stenosis in the V4
segment with luminal diameter of less than 1 mm. This is difficult
to compare precisely to the catheter angiogram, but I wonder if the
stenosis is more severe. 50% stenosis estimated at the tiny left
vertebral artery origin.

Extensive atherosclerotic disease at both carotid bifurcations.
Proximal ICA stenosis on the right of 70%. Stenosis in the ICA bulb
on the left of 40%.

Atherosclerotic disease in both carotid siphon regions but without
stenosis greater than 30% suspected.

Intracranial medium to small vessel atherosclerotic irregularity.

## 2018-09-18 MED ORDER — IOPAMIDOL (ISOVUE-370) INJECTION 76%
50.0000 mL | Freq: Once | INTRAVENOUS | Status: AC | PRN
  Administered 2018-09-18: 50 mL via INTRAVENOUS

## 2018-09-18 MED ORDER — IOPAMIDOL (ISOVUE-370) INJECTION 76%
INTRAVENOUS | Status: AC
  Filled 2018-09-18: qty 50

## 2018-09-19 ENCOUNTER — Telehealth (HOSPITAL_COMMUNITY): Payer: Self-pay

## 2018-09-19 NOTE — Telephone Encounter (Signed)
Pt agreed to f/u in 6 months with cta head/neck. AW 

## 2018-09-25 DIAGNOSIS — R262 Difficulty in walking, not elsewhere classified: Secondary | ICD-10-CM | POA: Diagnosis not present

## 2018-09-25 DIAGNOSIS — M25661 Stiffness of right knee, not elsewhere classified: Secondary | ICD-10-CM | POA: Diagnosis not present

## 2018-09-25 DIAGNOSIS — Z96653 Presence of artificial knee joint, bilateral: Secondary | ICD-10-CM | POA: Diagnosis not present

## 2018-09-25 DIAGNOSIS — M6281 Muscle weakness (generalized): Secondary | ICD-10-CM | POA: Diagnosis not present

## 2018-09-25 DIAGNOSIS — M25662 Stiffness of left knee, not elsewhere classified: Secondary | ICD-10-CM | POA: Diagnosis not present

## 2018-09-25 DIAGNOSIS — S330XXS Traumatic rupture of lumbar intervertebral disc, sequela: Secondary | ICD-10-CM | POA: Diagnosis not present

## 2018-09-27 DIAGNOSIS — R262 Difficulty in walking, not elsewhere classified: Secondary | ICD-10-CM | POA: Diagnosis not present

## 2018-09-27 DIAGNOSIS — S330XXS Traumatic rupture of lumbar intervertebral disc, sequela: Secondary | ICD-10-CM | POA: Diagnosis not present

## 2018-09-27 DIAGNOSIS — M25661 Stiffness of right knee, not elsewhere classified: Secondary | ICD-10-CM | POA: Diagnosis not present

## 2018-09-27 DIAGNOSIS — M6281 Muscle weakness (generalized): Secondary | ICD-10-CM | POA: Diagnosis not present

## 2018-09-27 DIAGNOSIS — Z96653 Presence of artificial knee joint, bilateral: Secondary | ICD-10-CM | POA: Diagnosis not present

## 2018-09-27 DIAGNOSIS — M25662 Stiffness of left knee, not elsewhere classified: Secondary | ICD-10-CM | POA: Diagnosis not present

## 2018-10-02 DIAGNOSIS — Z85820 Personal history of malignant melanoma of skin: Secondary | ICD-10-CM | POA: Diagnosis not present

## 2018-10-02 DIAGNOSIS — L570 Actinic keratosis: Secondary | ICD-10-CM | POA: Diagnosis not present

## 2018-10-02 DIAGNOSIS — D1801 Hemangioma of skin and subcutaneous tissue: Secondary | ICD-10-CM | POA: Diagnosis not present

## 2018-10-02 DIAGNOSIS — L821 Other seborrheic keratosis: Secondary | ICD-10-CM | POA: Diagnosis not present

## 2018-10-02 DIAGNOSIS — L720 Epidermal cyst: Secondary | ICD-10-CM | POA: Diagnosis not present

## 2018-10-02 DIAGNOSIS — Z85828 Personal history of other malignant neoplasm of skin: Secondary | ICD-10-CM | POA: Diagnosis not present

## 2018-10-03 DIAGNOSIS — C61 Malignant neoplasm of prostate: Secondary | ICD-10-CM | POA: Diagnosis not present

## 2018-10-03 DIAGNOSIS — Z96653 Presence of artificial knee joint, bilateral: Secondary | ICD-10-CM | POA: Diagnosis not present

## 2018-10-03 DIAGNOSIS — M6281 Muscle weakness (generalized): Secondary | ICD-10-CM | POA: Diagnosis not present

## 2018-10-03 DIAGNOSIS — R351 Nocturia: Secondary | ICD-10-CM | POA: Diagnosis not present

## 2018-10-03 DIAGNOSIS — S330XXS Traumatic rupture of lumbar intervertebral disc, sequela: Secondary | ICD-10-CM | POA: Diagnosis not present

## 2018-10-03 DIAGNOSIS — I6509 Occlusion and stenosis of unspecified vertebral artery: Secondary | ICD-10-CM | POA: Diagnosis not present

## 2018-10-03 DIAGNOSIS — Z6827 Body mass index (BMI) 27.0-27.9, adult: Secondary | ICD-10-CM | POA: Diagnosis not present

## 2018-10-03 DIAGNOSIS — I4819 Other persistent atrial fibrillation: Secondary | ICD-10-CM | POA: Diagnosis not present

## 2018-10-03 DIAGNOSIS — M25662 Stiffness of left knee, not elsewhere classified: Secondary | ICD-10-CM | POA: Diagnosis not present

## 2018-10-03 DIAGNOSIS — N401 Enlarged prostate with lower urinary tract symptoms: Secondary | ICD-10-CM | POA: Diagnosis not present

## 2018-10-03 DIAGNOSIS — M545 Low back pain: Secondary | ICD-10-CM | POA: Diagnosis not present

## 2018-10-03 DIAGNOSIS — R262 Difficulty in walking, not elsewhere classified: Secondary | ICD-10-CM | POA: Diagnosis not present

## 2018-10-03 DIAGNOSIS — M25661 Stiffness of right knee, not elsewhere classified: Secondary | ICD-10-CM | POA: Diagnosis not present

## 2018-10-03 DIAGNOSIS — N182 Chronic kidney disease, stage 2 (mild): Secondary | ICD-10-CM | POA: Diagnosis not present

## 2018-10-04 DIAGNOSIS — M25662 Stiffness of left knee, not elsewhere classified: Secondary | ICD-10-CM | POA: Diagnosis not present

## 2018-10-04 DIAGNOSIS — S330XXS Traumatic rupture of lumbar intervertebral disc, sequela: Secondary | ICD-10-CM | POA: Diagnosis not present

## 2018-10-04 DIAGNOSIS — M25661 Stiffness of right knee, not elsewhere classified: Secondary | ICD-10-CM | POA: Diagnosis not present

## 2018-10-04 DIAGNOSIS — R262 Difficulty in walking, not elsewhere classified: Secondary | ICD-10-CM | POA: Diagnosis not present

## 2018-10-04 DIAGNOSIS — Z96653 Presence of artificial knee joint, bilateral: Secondary | ICD-10-CM | POA: Diagnosis not present

## 2018-10-04 DIAGNOSIS — M6281 Muscle weakness (generalized): Secondary | ICD-10-CM | POA: Diagnosis not present

## 2018-10-16 DIAGNOSIS — M25661 Stiffness of right knee, not elsewhere classified: Secondary | ICD-10-CM | POA: Diagnosis not present

## 2018-10-16 DIAGNOSIS — R262 Difficulty in walking, not elsewhere classified: Secondary | ICD-10-CM | POA: Diagnosis not present

## 2018-10-16 DIAGNOSIS — M25662 Stiffness of left knee, not elsewhere classified: Secondary | ICD-10-CM | POA: Diagnosis not present

## 2018-10-16 DIAGNOSIS — Z96653 Presence of artificial knee joint, bilateral: Secondary | ICD-10-CM | POA: Diagnosis not present

## 2018-10-16 DIAGNOSIS — M6281 Muscle weakness (generalized): Secondary | ICD-10-CM | POA: Diagnosis not present

## 2018-10-16 DIAGNOSIS — S330XXS Traumatic rupture of lumbar intervertebral disc, sequela: Secondary | ICD-10-CM | POA: Diagnosis not present

## 2018-10-18 DIAGNOSIS — Z96653 Presence of artificial knee joint, bilateral: Secondary | ICD-10-CM | POA: Diagnosis not present

## 2018-10-18 DIAGNOSIS — S330XXS Traumatic rupture of lumbar intervertebral disc, sequela: Secondary | ICD-10-CM | POA: Diagnosis not present

## 2018-10-18 DIAGNOSIS — M25662 Stiffness of left knee, not elsewhere classified: Secondary | ICD-10-CM | POA: Diagnosis not present

## 2018-10-18 DIAGNOSIS — R262 Difficulty in walking, not elsewhere classified: Secondary | ICD-10-CM | POA: Diagnosis not present

## 2018-10-18 DIAGNOSIS — M6281 Muscle weakness (generalized): Secondary | ICD-10-CM | POA: Diagnosis not present

## 2018-10-18 DIAGNOSIS — M25661 Stiffness of right knee, not elsewhere classified: Secondary | ICD-10-CM | POA: Diagnosis not present

## 2018-10-23 DIAGNOSIS — M25662 Stiffness of left knee, not elsewhere classified: Secondary | ICD-10-CM | POA: Diagnosis not present

## 2018-10-23 DIAGNOSIS — M25661 Stiffness of right knee, not elsewhere classified: Secondary | ICD-10-CM | POA: Diagnosis not present

## 2018-10-23 DIAGNOSIS — R262 Difficulty in walking, not elsewhere classified: Secondary | ICD-10-CM | POA: Diagnosis not present

## 2018-10-23 DIAGNOSIS — S330XXS Traumatic rupture of lumbar intervertebral disc, sequela: Secondary | ICD-10-CM | POA: Diagnosis not present

## 2018-10-23 DIAGNOSIS — Z96653 Presence of artificial knee joint, bilateral: Secondary | ICD-10-CM | POA: Diagnosis not present

## 2018-10-23 DIAGNOSIS — M6281 Muscle weakness (generalized): Secondary | ICD-10-CM | POA: Diagnosis not present

## 2018-10-26 DIAGNOSIS — M25661 Stiffness of right knee, not elsewhere classified: Secondary | ICD-10-CM | POA: Diagnosis not present

## 2018-10-26 DIAGNOSIS — R262 Difficulty in walking, not elsewhere classified: Secondary | ICD-10-CM | POA: Diagnosis not present

## 2018-10-26 DIAGNOSIS — M6281 Muscle weakness (generalized): Secondary | ICD-10-CM | POA: Diagnosis not present

## 2018-10-26 DIAGNOSIS — S330XXS Traumatic rupture of lumbar intervertebral disc, sequela: Secondary | ICD-10-CM | POA: Diagnosis not present

## 2018-10-26 DIAGNOSIS — M25662 Stiffness of left knee, not elsewhere classified: Secondary | ICD-10-CM | POA: Diagnosis not present

## 2018-10-26 DIAGNOSIS — Z96653 Presence of artificial knee joint, bilateral: Secondary | ICD-10-CM | POA: Diagnosis not present

## 2018-10-29 DIAGNOSIS — Z87891 Personal history of nicotine dependence: Secondary | ICD-10-CM | POA: Diagnosis not present

## 2018-10-29 DIAGNOSIS — I639 Cerebral infarction, unspecified: Secondary | ICD-10-CM | POA: Diagnosis not present

## 2018-10-29 DIAGNOSIS — Z8673 Personal history of transient ischemic attack (TIA), and cerebral infarction without residual deficits: Secondary | ICD-10-CM | POA: Diagnosis not present

## 2018-10-29 DIAGNOSIS — R531 Weakness: Secondary | ICD-10-CM | POA: Diagnosis not present

## 2018-10-29 DIAGNOSIS — R4781 Slurred speech: Secondary | ICD-10-CM | POA: Diagnosis not present

## 2018-10-29 DIAGNOSIS — Z79899 Other long term (current) drug therapy: Secondary | ICD-10-CM | POA: Diagnosis not present

## 2018-10-29 DIAGNOSIS — E7800 Pure hypercholesterolemia, unspecified: Secondary | ICD-10-CM | POA: Diagnosis not present

## 2018-10-29 DIAGNOSIS — Z7982 Long term (current) use of aspirin: Secondary | ICD-10-CM | POA: Diagnosis not present

## 2018-10-29 DIAGNOSIS — K219 Gastro-esophageal reflux disease without esophagitis: Secondary | ICD-10-CM | POA: Diagnosis not present

## 2018-10-29 DIAGNOSIS — I6509 Occlusion and stenosis of unspecified vertebral artery: Secondary | ICD-10-CM | POA: Diagnosis not present

## 2018-10-29 DIAGNOSIS — J449 Chronic obstructive pulmonary disease, unspecified: Secondary | ICD-10-CM | POA: Diagnosis not present

## 2018-10-29 DIAGNOSIS — I10 Essential (primary) hypertension: Secondary | ICD-10-CM | POA: Diagnosis not present

## 2018-10-29 DIAGNOSIS — R42 Dizziness and giddiness: Secondary | ICD-10-CM | POA: Diagnosis not present

## 2018-10-29 DIAGNOSIS — Z7901 Long term (current) use of anticoagulants: Secondary | ICD-10-CM | POA: Diagnosis not present

## 2018-10-29 DIAGNOSIS — E039 Hypothyroidism, unspecified: Secondary | ICD-10-CM | POA: Diagnosis not present

## 2018-10-29 DIAGNOSIS — M1990 Unspecified osteoarthritis, unspecified site: Secondary | ICD-10-CM | POA: Diagnosis not present

## 2018-10-29 DIAGNOSIS — I6529 Occlusion and stenosis of unspecified carotid artery: Secondary | ICD-10-CM | POA: Diagnosis not present

## 2018-10-30 DIAGNOSIS — M48061 Spinal stenosis, lumbar region without neurogenic claudication: Secondary | ICD-10-CM | POA: Diagnosis not present

## 2018-10-30 DIAGNOSIS — M5417 Radiculopathy, lumbosacral region: Secondary | ICD-10-CM | POA: Diagnosis not present

## 2018-11-02 DIAGNOSIS — S330XXS Traumatic rupture of lumbar intervertebral disc, sequela: Secondary | ICD-10-CM | POA: Diagnosis not present

## 2018-11-02 DIAGNOSIS — M6281 Muscle weakness (generalized): Secondary | ICD-10-CM | POA: Diagnosis not present

## 2018-11-02 DIAGNOSIS — M25662 Stiffness of left knee, not elsewhere classified: Secondary | ICD-10-CM | POA: Diagnosis not present

## 2018-11-02 DIAGNOSIS — Z96653 Presence of artificial knee joint, bilateral: Secondary | ICD-10-CM | POA: Diagnosis not present

## 2018-11-02 DIAGNOSIS — M25661 Stiffness of right knee, not elsewhere classified: Secondary | ICD-10-CM | POA: Diagnosis not present

## 2018-11-02 DIAGNOSIS — R262 Difficulty in walking, not elsewhere classified: Secondary | ICD-10-CM | POA: Diagnosis not present

## 2018-11-05 ENCOUNTER — Encounter: Payer: Self-pay | Admitting: Cardiovascular Disease

## 2018-11-05 ENCOUNTER — Ambulatory Visit (INDEPENDENT_AMBULATORY_CARE_PROVIDER_SITE_OTHER): Payer: Medicare Other | Admitting: Cardiovascular Disease

## 2018-11-05 VITALS — BP 100/64 | HR 58 | Ht 66.0 in | Wt 186.0 lb

## 2018-11-05 DIAGNOSIS — I10 Essential (primary) hypertension: Secondary | ICD-10-CM | POA: Diagnosis not present

## 2018-11-05 DIAGNOSIS — E782 Mixed hyperlipidemia: Secondary | ICD-10-CM | POA: Diagnosis not present

## 2018-11-05 DIAGNOSIS — I651 Occlusion and stenosis of basilar artery: Secondary | ICD-10-CM | POA: Diagnosis not present

## 2018-11-05 DIAGNOSIS — I4819 Other persistent atrial fibrillation: Secondary | ICD-10-CM | POA: Diagnosis not present

## 2018-11-05 DIAGNOSIS — I6509 Occlusion and stenosis of unspecified vertebral artery: Secondary | ICD-10-CM | POA: Diagnosis not present

## 2018-11-05 DIAGNOSIS — Z7901 Long term (current) use of anticoagulants: Secondary | ICD-10-CM

## 2018-11-05 NOTE — Patient Instructions (Signed)

## 2018-11-05 NOTE — Progress Notes (Signed)
SUBJECTIVE: The patient presents for follow-up of persistent atrial fibrillation.  He underwent direct-current cardioversion in 2019 and subsequently went back in atrial fibrillation.  At that time, I discussion was held with him about initiating antiarrhythmic therapy and the possibility of repeat direct-current cardioversion but the patient preferred a rate control strategy.  He is here with his wife.  He was apparently evaluated in the [facility] ED within the last several days for dizziness, left arm weakness, and left leg weakness.  He told me a head CT was done which showed no significant changes in his vertebrobasilar artery stenosis.  He had his orthopedic surgeon take a look at the films as well.  He plans to take the films to Dr. Estanislado Pandy.  He again has questions about antiplatelet therapy and systemic anticoagulation to see if any particular blood thinner will resolve his neurologic issue.  He denies chest pain and shortness of breath.  He seldom has palpitations.      Review of Systems: As per "subjective", otherwise negative.  Allergies  Allergen Reactions  . Tramadol Nausea Only    Current Outpatient Medications  Medication Sig Dispense Refill  . acetaminophen (TYLENOL) 500 MG tablet Take 1,000 mg by mouth 2 (two) times daily.     Marland Kitchen apixaban (ELIQUIS) 5 MG TABS tablet Take 1 tablet (5 mg total) by mouth 2 (two) times daily. 4 tablet 0  . aspirin EC 81 MG tablet Take 81 mg by mouth at bedtime.     Marland Kitchen atorvastatin (LIPITOR) 10 MG tablet Take 10 mg by mouth every evening.     . Carboxymethylcellul-Glycerin (LUBRICATING EYE DROPS OP) Place 1-2 drops into both eyes 2 (two) times daily as needed (for dry eyes).     Marland Kitchen diltiazem (CARDIZEM) 60 MG tablet Take 1 tablet (60 mg total) by mouth 2 (two) times daily. (May take an extra 30mg  in the evening as needed)  Med increased 10/26/2017.   PLACE ON FILE 75 tablet 6  . doxazosin (CARDURA) 8 MG tablet Take 8 mg by mouth  at bedtime.    . fluticasone (FLONASE) 50 MCG/ACT nasal spray Place 2 sprays into both nostrils daily as needed for allergies or rhinitis.     Marland Kitchen labetalol (NORMODYNE) 200 MG tablet Take 200 mg by mouth 2 (two) times daily.    Marland Kitchen levothyroxine (SYNTHROID, LEVOTHROID) 150 MCG tablet Take 150 mcg daily before breakfast by mouth.    Marland Kitchen lisinopril (PRINIVIL,ZESTRIL) 40 MG tablet Take 40 mg by mouth daily.    . Menthol, Topical Analgesic, (BLUE-EMU MAXIMUM STRENGTH EX) Apply 1 application topically 3 (three) times daily as needed (for back pain.).    Marland Kitchen omeprazole (PRILOSEC) 20 MG capsule Take 20 mg by mouth daily.    . Oxycodone HCl 10 MG TABS Take by mouth.     No current facility-administered medications for this visit.    Facility-Administered Medications Ordered in Other Visits  Medication Dose Route Frequency Provider Last Rate Last Dose  . clopidogrel (PLAVIX) tablet 75 mg  75 mg Oral 60 min Pre-Op Monia Sabal, PA-C        Past Medical History:  Diagnosis Date  . Anxiety 10/31/2014   due to upcoming procedure  . Arrhythmia   . Arthritis   . Cancer ([place])    Melonoma on nose- removed  . Chronic kidney disease   . Dyspnea   . Dysrhythmia    afib  . Enlarged prostate   . GERD (gastroesophageal  reflux disease)   . Headache   . Hyperlipidemia   . Hypertension   . Hypothyroidism   . Overactive bladder   . Stroke [place])    TIA - "nothing taste right anymore, right eye pain  . TIA (transient ischemic attack)   . Wears glasses     Past Surgical History:  Procedure Laterality Date  . BACK SURGERY  08/2013   x2  . CARDIOVERSION N/A 01/31/2018   Procedure: CARDIOVERSION;  Surgeon: Arnoldo Lenis, MD;  Location: [facility];  Service: Endoscopy;  Laterality: N/A;  . CATARACT EXTRACTION    . COLONOSCOPY    . EYE SURGERY Bilateral    cataract  . IR ANGIO INTRA EXTRACRAN SEL COM CAROTID INNOMINATE BILAT MOD SED  08/04/2017  . IR ANGIO VERTEBRAL SEL VERTEBRAL BILAT MOD SED   08/04/2017  . IR ANGIO VERTEBRAL SEL VERTEBRAL UNI R MOD SED  11/01/2017  . IR RADIOLOGIST EVAL & MGMT  07/28/2017  . IR RADIOLOGIST EVAL & MGMT  08/22/2017  . IR RADIOLOGIST EVAL & MGMT  11/29/2017  . JOINT REPLACEMENT Left 10/2015   hip  . KNEE ARTHROPLASTY Left 1999  . KNEE ARTHROPLASTY Right 2008  . LUMBAR FUSION  06/2014  . RADIOLOGY WITH ANESTHESIA N/A 11/01/2017   Procedure: STENTING;  Surgeon: Luanne Bras, MD;  Location: [place];  Service: Radiology;  Laterality: N/A;  . TRANSURETHRAL RESECTION OF PROSTATE  03/2015  . TRIGGER FINGER RELEASE Right    3rd and 4th    Social History   Socioeconomic History  . Marital status: Married    Spouse name: Not on file  . Number of children: Not on file  . Years of education: Not on file  . Highest education level: Not on file  Occupational History  . Not on file  Social Needs  . Financial resource strain: Not on file  . Food insecurity:    Worry: Not on file    Inability: Not on file  . Transportation needs:    Medical: Not on file    Non-medical: Not on file  Tobacco Use  . Smoking status: Former Smoker    Years: 40.00    Types: Cigarettes    Start date: 10/17/1965    Last attempt to quit: 10/17/1978    Years since quitting: 40.0  . Smokeless tobacco: Never Used  Substance and Sexual Activity  . Alcohol use: No    Alcohol/week: 0.0 standard drinks  . Drug use: No  . Sexual activity: Yes    Birth control/protection: None  Lifestyle  . Physical activity:    Days per week: Not on file    Minutes per session: Not on file  . Stress: Not on file  Relationships  . Social connections:    Talks on phone: Not on file    Gets together: Not on file    Attends religious service: Not on file    Active member of club or organization: Not on file    Attends meetings of clubs or organizations: Not on file    Relationship status: Not on file  . Intimate partner violence:    Fear of current or ex partner: Not on file     Emotionally abused: Not on file    Physically abused: Not on file    Forced sexual activity: Not on file  Other Topics Concern  . Not on file  Social History Narrative  . Not on file     Vitals:  11/05/18 0818  BP: 100/64  Pulse: (!) 58  SpO2: 94%  Weight: 186 lb (84.4 kg)  Height: 5\' 6"  (1.676 m)    Wt Readings from Last 3 Encounters:  11/05/18 186 lb (84.4 kg)  07/18/18 177 lb (80.3 kg)  02/28/18 177 lb (80.3 kg)     PHYSICAL EXAM General: NAD HEENT: Normal. Neck: No JVD, no thyromegaly. Lungs: Clear to auscultation bilaterally with normal respiratory effort. CV: Regular rate and irregular rhythm, normal S1/S2, no S3, no murmur. No pretibial or periankle edema.  No carotid bruit.   Abdomen: Soft, nontender, no distention.  Neurologic: Alert and oriented.  Psych: Normal affect. Skin: Normal. Musculoskeletal: No gross deformities.    ECG: Reviewed above under Subjective   Labs: Lab Results  Component Value Date/Time   K 4.4 01/25/2018 01:50 PM   BUN 27 (H) 01/25/2018 01:50 PM   CREATININE 1.40 (H) 09/18/2018 03:30 PM   ALT 14 (L) 11/01/2017 06:22 AM   HGB 11.9 (L) 01/25/2018 01:50 PM     Lipids: No results found for: LDLCALC, LDLDIRECT, CHOL, TRIG, HDL     ASSESSMENT AND PLAN: 1.  Persistent atrial fibrillation: Symptomatically stable. He underwent direct-current cardioversion on 01/31/2018.  He subsequently went back into atrial fibrillation.  A discussion was previously held with him about initiating antiarrhythmic therapy and the possibility of repeat direct-current cardioversion but the patient preferred a rate control strategy.  He is on diltiazem, labetalol, and apixaban.  We had another long discussion about the differences between systemic anticoagulation versus antiplatelet therapy.  2.  Hypertension: Blood pressure is normal.  No change to therapy.  3.  Hyperlipidemia: Remains on atorvastatin 10 mg daily.  No changes.  4.  Vertebrobasilar  artery stenosis: He has known right-sided stenosis and this is followed by Dr. Estanislado Pandy. We had another long discussion about the differences between systemic anticoagulation versus antiplatelet therapy.     Disposition: Follow up 6 months   Kate Sable, M.D., F.A.C.C.

## 2018-11-06 ENCOUNTER — Inpatient Hospital Stay
Admission: RE | Admit: 2018-11-06 | Discharge: 2018-11-06 | Disposition: A | Discharge status: Home / Self Care | Payer: Self-pay | Source: Ambulatory Visit | Attending: Interventional Radiology | Admitting: Interventional Radiology

## 2018-11-06 ENCOUNTER — Other Ambulatory Visit: Payer: Self-pay | Admitting: Interventional Radiology

## 2018-11-06 ENCOUNTER — Other Ambulatory Visit (HOSPITAL_COMMUNITY): Payer: Self-pay | Admitting: Interventional Radiology

## 2018-11-06 DIAGNOSIS — I771 Stricture of artery: Secondary | ICD-10-CM

## 2018-11-06 DIAGNOSIS — I639 Cerebral infarction, unspecified: Secondary | ICD-10-CM

## 2018-11-12 ENCOUNTER — Ambulatory Visit (HOSPITAL_COMMUNITY)
Admission: RE | Admit: 2018-11-12 | Discharge: 2018-11-12 | Disposition: A | Discharge status: Home / Self Care | Payer: Medicare Other | Source: Ambulatory Visit | Attending: Interventional Radiology | Admitting: Interventional Radiology

## 2018-11-12 DIAGNOSIS — I639 Cerebral infarction, unspecified: Secondary | ICD-10-CM

## 2018-11-12 NOTE — Progress Notes (Signed)
Supervising Physician: Luanne Bras  Patient Status: [facility] - Out-pt  Subjective: Pt here to discuss recent CTA. Scott [name] well to know to [place] with known hx of (R)ICA and (R)vertebral artery stenoses. He has been stable on ASA 81mg  and Eliquis. He takes Eliquis instead of Plavix or Brilinta to cover his Afib. About 2 weeks ago, he had 2 episodes of (L)arm and leg weakness that he says lasted a couple hours. He and his wife also reported some slurred speech at that time as well. After resting a bit, the sxs resolved. However, he was sent to the ER and had a CTA of his neck/head performed. This again showed the arterial stenosis mentioned above but no evidence of acute occlusion. He is here to discuss the results.  Objective: Physical Exam: There were no vitals taken for this visit.    Current Outpatient Medications:  .  acetaminophen (TYLENOL) 500 MG tablet, Take 1,000 mg by mouth 2 (two) times daily. , Disp: , Rfl:  .  apixaban (ELIQUIS) 5 MG TABS tablet, Take 1 tablet (5 mg total) by mouth 2 (two) times daily., Disp: 4 tablet, Rfl: 0 .  aspirin EC 81 MG tablet, Take 81 mg by mouth at bedtime. , Disp: , Rfl:  .  atorvastatin (LIPITOR) 10 MG tablet, Take 10 mg by mouth every evening. , Disp: , Rfl:  .  Carboxymethylcellul-Glycerin (LUBRICATING EYE DROPS OP), Place 1-2 drops into both eyes 2 (two) times daily as needed (for dry eyes). , Disp: , Rfl:  .  diltiazem (CARDIZEM) 60 MG tablet, Take 1 tablet (60 mg total) by mouth 2 (two) times daily. (May take an extra 30mg  in the evening as needed)  Med increased 10/26/2017.   PLACE ON FILE, Disp: 75 tablet, Rfl: 6 .  doxazosin (CARDURA) 8 MG tablet, Take 8 mg by mouth at bedtime., Disp: , Rfl:  .  fluticasone (FLONASE) 50 MCG/ACT nasal spray, Place 2 sprays into both nostrils daily as needed for allergies or rhinitis. , Disp: , Rfl:  .  labetalol (NORMODYNE) 200 MG tablet, Take 200 mg by mouth 2 (two) times daily., Disp: , Rfl:  .   levothyroxine (SYNTHROID, LEVOTHROID) 150 MCG tablet, Take 150 mcg daily before breakfast by mouth., Disp: , Rfl:  .  lisinopril (PRINIVIL,ZESTRIL) 40 MG tablet, Take 40 mg by mouth daily., Disp: , Rfl:  .  Menthol, Topical Analgesic, (BLUE-EMU MAXIMUM STRENGTH EX), Apply 1 application topically 3 (three) times daily as needed (for back pain.)., Disp: , Rfl:  .  omeprazole (PRILOSEC) 20 MG capsule, Take 20 mg by mouth daily., Disp: , Rfl:  .  Oxycodone HCl 10 MG TABS, Take by mouth., Disp: , Rfl:  No current facility-administered medications for this encounter.   Facility-Administered Medications Ordered in Other Encounters:  .  clopidogrel (PLAVIX) tablet 75 mg, 75 mg, Oral, 60 min Pre-Op, Monia Sabal, PA-C   Studies/Results: No results found.  Assessment/Plan: Known severe (R)ICA and vertebral artery stenosis. We reviewed his imaging with him and his wife. Continue current medication regimen.  If these episodes become more frequent or of longer duration, will need to discuss/plan intervention, which will involve switching off Eliquis and back onto Plavix/Brilinta prior to procedure. Pt is followed closely by his Cardiologist as well.     LOS: 0 days   I spent a total of 20 minutes in face to face in clinical consultation, greater than 50% of which was counseling/coordinating care for cerebral vascular disease  Ascencion Dike PA-C 11/12/2018 2:21 PM

## 2018-11-23 DIAGNOSIS — I480 Paroxysmal atrial fibrillation: Secondary | ICD-10-CM | POA: Diagnosis not present

## 2018-11-23 DIAGNOSIS — N182 Chronic kidney disease, stage 2 (mild): Secondary | ICD-10-CM | POA: Diagnosis not present

## 2018-11-23 DIAGNOSIS — I10 Essential (primary) hypertension: Secondary | ICD-10-CM | POA: Diagnosis not present

## 2018-11-23 DIAGNOSIS — E559 Vitamin D deficiency, unspecified: Secondary | ICD-10-CM | POA: Diagnosis not present

## 2018-11-23 DIAGNOSIS — E039 Hypothyroidism, unspecified: Secondary | ICD-10-CM | POA: Diagnosis not present

## 2018-11-28 DIAGNOSIS — Z0001 Encounter for general adult medical examination with abnormal findings: Secondary | ICD-10-CM | POA: Diagnosis not present

## 2018-11-28 DIAGNOSIS — Z6827 Body mass index (BMI) 27.0-27.9, adult: Secondary | ICD-10-CM | POA: Diagnosis not present

## 2018-11-28 DIAGNOSIS — I10 Essential (primary) hypertension: Secondary | ICD-10-CM | POA: Diagnosis not present

## 2018-12-17 ENCOUNTER — Ambulatory Visit: Payer: Medicare Other | Admitting: Cardiovascular Disease

## 2019-01-13 DIAGNOSIS — R0602 Shortness of breath: Secondary | ICD-10-CM | POA: Diagnosis not present

## 2019-01-13 DIAGNOSIS — I10 Essential (primary) hypertension: Secondary | ICD-10-CM | POA: Diagnosis not present

## 2019-01-13 DIAGNOSIS — Z9181 History of falling: Secondary | ICD-10-CM | POA: Diagnosis not present

## 2019-01-13 DIAGNOSIS — R4182 Altered mental status, unspecified: Secondary | ICD-10-CM | POA: Diagnosis not present

## 2019-01-13 DIAGNOSIS — Z791 Long term (current) use of non-steroidal anti-inflammatories (NSAID): Secondary | ICD-10-CM | POA: Diagnosis not present

## 2019-01-13 DIAGNOSIS — E785 Hyperlipidemia, unspecified: Secondary | ICD-10-CM | POA: Diagnosis not present

## 2019-01-13 DIAGNOSIS — D649 Anemia, unspecified: Secondary | ICD-10-CM | POA: Diagnosis not present

## 2019-01-13 DIAGNOSIS — R410 Disorientation, unspecified: Secondary | ICD-10-CM | POA: Diagnosis not present

## 2019-01-13 DIAGNOSIS — I959 Hypotension, unspecified: Secondary | ICD-10-CM | POA: Diagnosis not present

## 2019-01-13 DIAGNOSIS — R4020 Unspecified coma: Secondary | ICD-10-CM | POA: Diagnosis not present

## 2019-01-13 DIAGNOSIS — K4020 Bilateral inguinal hernia, without obstruction or gangrene, not specified as recurrent: Secondary | ICD-10-CM | POA: Diagnosis not present

## 2019-01-13 DIAGNOSIS — N400 Enlarged prostate without lower urinary tract symptoms: Secondary | ICD-10-CM | POA: Diagnosis not present

## 2019-01-13 DIAGNOSIS — R109 Unspecified abdominal pain: Secondary | ICD-10-CM | POA: Diagnosis not present

## 2019-01-13 DIAGNOSIS — Z7902 Long term (current) use of antithrombotics/antiplatelets: Secondary | ICD-10-CM | POA: Diagnosis not present

## 2019-01-13 DIAGNOSIS — Z981 Arthrodesis status: Secondary | ICD-10-CM | POA: Diagnosis not present

## 2019-01-13 DIAGNOSIS — Z96642 Presence of left artificial hip joint: Secondary | ICD-10-CM | POA: Diagnosis not present

## 2019-01-13 DIAGNOSIS — E876 Hypokalemia: Secondary | ICD-10-CM | POA: Diagnosis not present

## 2019-01-13 DIAGNOSIS — I952 Hypotension due to drugs: Secondary | ICD-10-CM | POA: Diagnosis not present

## 2019-01-13 DIAGNOSIS — Z87891 Personal history of nicotine dependence: Secondary | ICD-10-CM | POA: Diagnosis not present

## 2019-01-13 DIAGNOSIS — Z79899 Other long term (current) drug therapy: Secondary | ICD-10-CM | POA: Diagnosis not present

## 2019-01-13 DIAGNOSIS — R404 Transient alteration of awareness: Secondary | ICD-10-CM | POA: Diagnosis not present

## 2019-01-13 DIAGNOSIS — K219 Gastro-esophageal reflux disease without esophagitis: Secondary | ICD-10-CM | POA: Diagnosis not present

## 2019-01-13 DIAGNOSIS — R001 Bradycardia, unspecified: Secondary | ICD-10-CM | POA: Diagnosis not present

## 2019-01-13 DIAGNOSIS — I482 Chronic atrial fibrillation, unspecified: Secondary | ICD-10-CM | POA: Diagnosis not present

## 2019-01-13 DIAGNOSIS — K5900 Constipation, unspecified: Secondary | ICD-10-CM | POA: Diagnosis not present

## 2019-01-13 DIAGNOSIS — E039 Hypothyroidism, unspecified: Secondary | ICD-10-CM | POA: Diagnosis not present

## 2019-01-13 DIAGNOSIS — R55 Syncope and collapse: Secondary | ICD-10-CM | POA: Diagnosis not present

## 2019-01-14 DIAGNOSIS — I952 Hypotension due to drugs: Secondary | ICD-10-CM | POA: Diagnosis not present

## 2019-01-14 DIAGNOSIS — I959 Hypotension, unspecified: Secondary | ICD-10-CM | POA: Diagnosis not present

## 2019-01-14 DIAGNOSIS — R001 Bradycardia, unspecified: Secondary | ICD-10-CM | POA: Diagnosis not present

## 2019-01-15 DIAGNOSIS — I959 Hypotension, unspecified: Secondary | ICD-10-CM | POA: Diagnosis not present

## 2019-01-15 DIAGNOSIS — I952 Hypotension due to drugs: Secondary | ICD-10-CM | POA: Diagnosis not present

## 2019-01-15 DIAGNOSIS — R001 Bradycardia, unspecified: Secondary | ICD-10-CM | POA: Diagnosis not present

## 2019-01-18 DIAGNOSIS — M1991 Primary osteoarthritis, unspecified site: Secondary | ICD-10-CM | POA: Diagnosis not present

## 2019-01-18 DIAGNOSIS — E039 Hypothyroidism, unspecified: Secondary | ICD-10-CM | POA: Diagnosis not present

## 2019-01-18 DIAGNOSIS — Z7982 Long term (current) use of aspirin: Secondary | ICD-10-CM | POA: Diagnosis not present

## 2019-01-18 DIAGNOSIS — Z9181 History of falling: Secondary | ICD-10-CM | POA: Diagnosis not present

## 2019-01-18 DIAGNOSIS — N400 Enlarged prostate without lower urinary tract symptoms: Secondary | ICD-10-CM | POA: Diagnosis not present

## 2019-01-18 DIAGNOSIS — Z7901 Long term (current) use of anticoagulants: Secondary | ICD-10-CM | POA: Diagnosis not present

## 2019-01-18 DIAGNOSIS — Z8673 Personal history of transient ischemic attack (TIA), and cerebral infarction without residual deficits: Secondary | ICD-10-CM | POA: Diagnosis not present

## 2019-01-18 DIAGNOSIS — G8929 Other chronic pain: Secondary | ICD-10-CM | POA: Diagnosis not present

## 2019-01-18 DIAGNOSIS — M549 Dorsalgia, unspecified: Secondary | ICD-10-CM | POA: Diagnosis not present

## 2019-01-18 DIAGNOSIS — I482 Chronic atrial fibrillation, unspecified: Secondary | ICD-10-CM | POA: Diagnosis not present

## 2019-01-18 DIAGNOSIS — K219 Gastro-esophageal reflux disease without esophagitis: Secondary | ICD-10-CM | POA: Diagnosis not present

## 2019-01-18 DIAGNOSIS — Z96653 Presence of artificial knee joint, bilateral: Secondary | ICD-10-CM | POA: Diagnosis not present

## 2019-01-18 DIAGNOSIS — E785 Hyperlipidemia, unspecified: Secondary | ICD-10-CM | POA: Diagnosis not present

## 2019-01-18 DIAGNOSIS — I129 Hypertensive chronic kidney disease with stage 1 through stage 4 chronic kidney disease, or unspecified chronic kidney disease: Secondary | ICD-10-CM | POA: Diagnosis not present

## 2019-01-18 DIAGNOSIS — Z96642 Presence of left artificial hip joint: Secondary | ICD-10-CM | POA: Diagnosis not present

## 2019-01-18 DIAGNOSIS — N182 Chronic kidney disease, stage 2 (mild): Secondary | ICD-10-CM | POA: Diagnosis not present

## 2019-01-18 DIAGNOSIS — Z87891 Personal history of nicotine dependence: Secondary | ICD-10-CM | POA: Diagnosis not present

## 2019-01-24 DIAGNOSIS — I482 Chronic atrial fibrillation, unspecified: Secondary | ICD-10-CM | POA: Diagnosis not present

## 2019-01-24 DIAGNOSIS — N182 Chronic kidney disease, stage 2 (mild): Secondary | ICD-10-CM | POA: Diagnosis not present

## 2019-01-24 DIAGNOSIS — M1991 Primary osteoarthritis, unspecified site: Secondary | ICD-10-CM | POA: Diagnosis not present

## 2019-01-24 DIAGNOSIS — N400 Enlarged prostate without lower urinary tract symptoms: Secondary | ICD-10-CM | POA: Diagnosis not present

## 2019-01-24 DIAGNOSIS — K219 Gastro-esophageal reflux disease without esophagitis: Secondary | ICD-10-CM | POA: Diagnosis not present

## 2019-01-24 DIAGNOSIS — I129 Hypertensive chronic kidney disease with stage 1 through stage 4 chronic kidney disease, or unspecified chronic kidney disease: Secondary | ICD-10-CM | POA: Diagnosis not present

## 2019-01-28 DIAGNOSIS — I482 Chronic atrial fibrillation, unspecified: Secondary | ICD-10-CM | POA: Diagnosis not present

## 2019-01-28 DIAGNOSIS — K219 Gastro-esophageal reflux disease without esophagitis: Secondary | ICD-10-CM | POA: Diagnosis not present

## 2019-01-28 DIAGNOSIS — M1991 Primary osteoarthritis, unspecified site: Secondary | ICD-10-CM | POA: Diagnosis not present

## 2019-01-28 DIAGNOSIS — N182 Chronic kidney disease, stage 2 (mild): Secondary | ICD-10-CM | POA: Diagnosis not present

## 2019-01-28 DIAGNOSIS — I129 Hypertensive chronic kidney disease with stage 1 through stage 4 chronic kidney disease, or unspecified chronic kidney disease: Secondary | ICD-10-CM | POA: Diagnosis not present

## 2019-01-28 DIAGNOSIS — N400 Enlarged prostate without lower urinary tract symptoms: Secondary | ICD-10-CM | POA: Diagnosis not present

## 2019-02-04 DIAGNOSIS — N182 Chronic kidney disease, stage 2 (mild): Secondary | ICD-10-CM | POA: Diagnosis not present

## 2019-02-04 DIAGNOSIS — K219 Gastro-esophageal reflux disease without esophagitis: Secondary | ICD-10-CM | POA: Diagnosis not present

## 2019-02-04 DIAGNOSIS — I129 Hypertensive chronic kidney disease with stage 1 through stage 4 chronic kidney disease, or unspecified chronic kidney disease: Secondary | ICD-10-CM | POA: Diagnosis not present

## 2019-02-04 DIAGNOSIS — N400 Enlarged prostate without lower urinary tract symptoms: Secondary | ICD-10-CM | POA: Diagnosis not present

## 2019-02-04 DIAGNOSIS — M1991 Primary osteoarthritis, unspecified site: Secondary | ICD-10-CM | POA: Diagnosis not present

## 2019-02-04 DIAGNOSIS — I482 Chronic atrial fibrillation, unspecified: Secondary | ICD-10-CM | POA: Diagnosis not present

## 2019-02-11 DIAGNOSIS — N400 Enlarged prostate without lower urinary tract symptoms: Secondary | ICD-10-CM | POA: Diagnosis not present

## 2019-02-11 DIAGNOSIS — K219 Gastro-esophageal reflux disease without esophagitis: Secondary | ICD-10-CM | POA: Diagnosis not present

## 2019-02-11 DIAGNOSIS — N182 Chronic kidney disease, stage 2 (mild): Secondary | ICD-10-CM | POA: Diagnosis not present

## 2019-02-11 DIAGNOSIS — M1991 Primary osteoarthritis, unspecified site: Secondary | ICD-10-CM | POA: Diagnosis not present

## 2019-02-11 DIAGNOSIS — I129 Hypertensive chronic kidney disease with stage 1 through stage 4 chronic kidney disease, or unspecified chronic kidney disease: Secondary | ICD-10-CM | POA: Diagnosis not present

## 2019-02-11 DIAGNOSIS — I482 Chronic atrial fibrillation, unspecified: Secondary | ICD-10-CM | POA: Diagnosis not present

## 2019-02-13 DIAGNOSIS — N182 Chronic kidney disease, stage 2 (mild): Secondary | ICD-10-CM | POA: Diagnosis not present

## 2019-02-13 DIAGNOSIS — I69398 Other sequelae of cerebral infarction: Secondary | ICD-10-CM | POA: Diagnosis not present

## 2019-02-13 DIAGNOSIS — I6521 Occlusion and stenosis of right carotid artery: Secondary | ICD-10-CM | POA: Diagnosis not present

## 2019-02-13 DIAGNOSIS — I6509 Occlusion and stenosis of unspecified vertebral artery: Secondary | ICD-10-CM | POA: Diagnosis not present

## 2019-02-13 DIAGNOSIS — I10 Essential (primary) hypertension: Secondary | ICD-10-CM | POA: Diagnosis not present

## 2019-02-13 DIAGNOSIS — E039 Hypothyroidism, unspecified: Secondary | ICD-10-CM | POA: Diagnosis not present

## 2019-02-13 DIAGNOSIS — I480 Paroxysmal atrial fibrillation: Secondary | ICD-10-CM | POA: Diagnosis not present

## 2019-02-13 DIAGNOSIS — R55 Syncope and collapse: Secondary | ICD-10-CM | POA: Diagnosis not present

## 2019-02-17 DIAGNOSIS — Z7982 Long term (current) use of aspirin: Secondary | ICD-10-CM | POA: Diagnosis not present

## 2019-02-17 DIAGNOSIS — Z96653 Presence of artificial knee joint, bilateral: Secondary | ICD-10-CM | POA: Diagnosis not present

## 2019-02-17 DIAGNOSIS — Z96642 Presence of left artificial hip joint: Secondary | ICD-10-CM | POA: Diagnosis not present

## 2019-02-17 DIAGNOSIS — N182 Chronic kidney disease, stage 2 (mild): Secondary | ICD-10-CM | POA: Diagnosis not present

## 2019-02-17 DIAGNOSIS — Z7901 Long term (current) use of anticoagulants: Secondary | ICD-10-CM | POA: Diagnosis not present

## 2019-02-17 DIAGNOSIS — M549 Dorsalgia, unspecified: Secondary | ICD-10-CM | POA: Diagnosis not present

## 2019-02-17 DIAGNOSIS — I129 Hypertensive chronic kidney disease with stage 1 through stage 4 chronic kidney disease, or unspecified chronic kidney disease: Secondary | ICD-10-CM | POA: Diagnosis not present

## 2019-02-17 DIAGNOSIS — Z9181 History of falling: Secondary | ICD-10-CM | POA: Diagnosis not present

## 2019-02-17 DIAGNOSIS — G8929 Other chronic pain: Secondary | ICD-10-CM | POA: Diagnosis not present

## 2019-02-17 DIAGNOSIS — N400 Enlarged prostate without lower urinary tract symptoms: Secondary | ICD-10-CM | POA: Diagnosis not present

## 2019-02-17 DIAGNOSIS — Z8673 Personal history of transient ischemic attack (TIA), and cerebral infarction without residual deficits: Secondary | ICD-10-CM | POA: Diagnosis not present

## 2019-02-17 DIAGNOSIS — K219 Gastro-esophageal reflux disease without esophagitis: Secondary | ICD-10-CM | POA: Diagnosis not present

## 2019-02-17 DIAGNOSIS — E785 Hyperlipidemia, unspecified: Secondary | ICD-10-CM | POA: Diagnosis not present

## 2019-02-17 DIAGNOSIS — I482 Chronic atrial fibrillation, unspecified: Secondary | ICD-10-CM | POA: Diagnosis not present

## 2019-02-17 DIAGNOSIS — Z87891 Personal history of nicotine dependence: Secondary | ICD-10-CM | POA: Diagnosis not present

## 2019-02-17 DIAGNOSIS — E039 Hypothyroidism, unspecified: Secondary | ICD-10-CM | POA: Diagnosis not present

## 2019-02-17 DIAGNOSIS — M1991 Primary osteoarthritis, unspecified site: Secondary | ICD-10-CM | POA: Diagnosis not present

## 2019-02-19 DIAGNOSIS — N400 Enlarged prostate without lower urinary tract symptoms: Secondary | ICD-10-CM | POA: Diagnosis not present

## 2019-02-19 DIAGNOSIS — K219 Gastro-esophageal reflux disease without esophagitis: Secondary | ICD-10-CM | POA: Diagnosis not present

## 2019-02-19 DIAGNOSIS — E785 Hyperlipidemia, unspecified: Secondary | ICD-10-CM | POA: Diagnosis not present

## 2019-02-19 DIAGNOSIS — I129 Hypertensive chronic kidney disease with stage 1 through stage 4 chronic kidney disease, or unspecified chronic kidney disease: Secondary | ICD-10-CM | POA: Diagnosis not present

## 2019-02-19 DIAGNOSIS — E039 Hypothyroidism, unspecified: Secondary | ICD-10-CM | POA: Diagnosis not present

## 2019-02-19 DIAGNOSIS — N182 Chronic kidney disease, stage 2 (mild): Secondary | ICD-10-CM | POA: Diagnosis not present

## 2019-02-19 DIAGNOSIS — I482 Chronic atrial fibrillation, unspecified: Secondary | ICD-10-CM | POA: Diagnosis not present

## 2019-02-19 DIAGNOSIS — M1991 Primary osteoarthritis, unspecified site: Secondary | ICD-10-CM | POA: Diagnosis not present

## 2019-02-22 DIAGNOSIS — I129 Hypertensive chronic kidney disease with stage 1 through stage 4 chronic kidney disease, or unspecified chronic kidney disease: Secondary | ICD-10-CM | POA: Diagnosis not present

## 2019-02-22 DIAGNOSIS — I482 Chronic atrial fibrillation, unspecified: Secondary | ICD-10-CM | POA: Diagnosis not present

## 2019-02-22 DIAGNOSIS — K219 Gastro-esophageal reflux disease without esophagitis: Secondary | ICD-10-CM | POA: Diagnosis not present

## 2019-02-22 DIAGNOSIS — M1991 Primary osteoarthritis, unspecified site: Secondary | ICD-10-CM | POA: Diagnosis not present

## 2019-02-22 DIAGNOSIS — N182 Chronic kidney disease, stage 2 (mild): Secondary | ICD-10-CM | POA: Diagnosis not present

## 2019-02-22 DIAGNOSIS — N400 Enlarged prostate without lower urinary tract symptoms: Secondary | ICD-10-CM | POA: Diagnosis not present

## 2019-02-28 DIAGNOSIS — N400 Enlarged prostate without lower urinary tract symptoms: Secondary | ICD-10-CM | POA: Diagnosis not present

## 2019-02-28 DIAGNOSIS — K219 Gastro-esophageal reflux disease without esophagitis: Secondary | ICD-10-CM | POA: Diagnosis not present

## 2019-02-28 DIAGNOSIS — M1991 Primary osteoarthritis, unspecified site: Secondary | ICD-10-CM | POA: Diagnosis not present

## 2019-02-28 DIAGNOSIS — I482 Chronic atrial fibrillation, unspecified: Secondary | ICD-10-CM | POA: Diagnosis not present

## 2019-02-28 DIAGNOSIS — I129 Hypertensive chronic kidney disease with stage 1 through stage 4 chronic kidney disease, or unspecified chronic kidney disease: Secondary | ICD-10-CM | POA: Diagnosis not present

## 2019-02-28 DIAGNOSIS — N182 Chronic kidney disease, stage 2 (mild): Secondary | ICD-10-CM | POA: Diagnosis not present

## 2019-03-04 DIAGNOSIS — M1991 Primary osteoarthritis, unspecified site: Secondary | ICD-10-CM | POA: Diagnosis not present

## 2019-03-04 DIAGNOSIS — N400 Enlarged prostate without lower urinary tract symptoms: Secondary | ICD-10-CM | POA: Diagnosis not present

## 2019-03-04 DIAGNOSIS — I482 Chronic atrial fibrillation, unspecified: Secondary | ICD-10-CM | POA: Diagnosis not present

## 2019-03-04 DIAGNOSIS — I129 Hypertensive chronic kidney disease with stage 1 through stage 4 chronic kidney disease, or unspecified chronic kidney disease: Secondary | ICD-10-CM | POA: Diagnosis not present

## 2019-03-04 DIAGNOSIS — N182 Chronic kidney disease, stage 2 (mild): Secondary | ICD-10-CM | POA: Diagnosis not present

## 2019-03-04 DIAGNOSIS — K219 Gastro-esophageal reflux disease without esophagitis: Secondary | ICD-10-CM | POA: Diagnosis not present

## 2019-03-11 DIAGNOSIS — K219 Gastro-esophageal reflux disease without esophagitis: Secondary | ICD-10-CM | POA: Diagnosis not present

## 2019-03-11 DIAGNOSIS — M1991 Primary osteoarthritis, unspecified site: Secondary | ICD-10-CM | POA: Diagnosis not present

## 2019-03-11 DIAGNOSIS — I482 Chronic atrial fibrillation, unspecified: Secondary | ICD-10-CM | POA: Diagnosis not present

## 2019-03-11 DIAGNOSIS — N182 Chronic kidney disease, stage 2 (mild): Secondary | ICD-10-CM | POA: Diagnosis not present

## 2019-03-11 DIAGNOSIS — I129 Hypertensive chronic kidney disease with stage 1 through stage 4 chronic kidney disease, or unspecified chronic kidney disease: Secondary | ICD-10-CM | POA: Diagnosis not present

## 2019-03-11 DIAGNOSIS — N400 Enlarged prostate without lower urinary tract symptoms: Secondary | ICD-10-CM | POA: Diagnosis not present

## 2019-03-22 DIAGNOSIS — C61 Malignant neoplasm of prostate: Secondary | ICD-10-CM | POA: Diagnosis not present

## 2019-03-22 DIAGNOSIS — R3915 Urgency of urination: Secondary | ICD-10-CM | POA: Diagnosis not present

## 2019-03-22 DIAGNOSIS — N5201 Erectile dysfunction due to arterial insufficiency: Secondary | ICD-10-CM | POA: Diagnosis not present

## 2019-03-22 DIAGNOSIS — R351 Nocturia: Secondary | ICD-10-CM | POA: Diagnosis not present

## 2019-04-03 DIAGNOSIS — L738 Other specified follicular disorders: Secondary | ICD-10-CM | POA: Diagnosis not present

## 2019-04-03 DIAGNOSIS — D225 Melanocytic nevi of trunk: Secondary | ICD-10-CM | POA: Diagnosis not present

## 2019-04-03 DIAGNOSIS — L814 Other melanin hyperpigmentation: Secondary | ICD-10-CM | POA: Diagnosis not present

## 2019-04-03 DIAGNOSIS — D1801 Hemangioma of skin and subcutaneous tissue: Secondary | ICD-10-CM | POA: Diagnosis not present

## 2019-04-03 DIAGNOSIS — D692 Other nonthrombocytopenic purpura: Secondary | ICD-10-CM | POA: Diagnosis not present

## 2019-04-03 DIAGNOSIS — L821 Other seborrheic keratosis: Secondary | ICD-10-CM | POA: Diagnosis not present

## 2019-04-03 DIAGNOSIS — Z85828 Personal history of other malignant neoplasm of skin: Secondary | ICD-10-CM | POA: Diagnosis not present

## 2019-04-03 DIAGNOSIS — L820 Inflamed seborrheic keratosis: Secondary | ICD-10-CM | POA: Diagnosis not present

## 2019-05-08 ENCOUNTER — Telehealth: Payer: Self-pay | Admitting: Cardiovascular Disease

## 2019-05-08 NOTE — Telephone Encounter (Signed)
Virtual Visit Pre-Appointment Phone Call  "(Name), I am calling you today to discuss your upcoming appointment. We are currently trying to limit exposure to the virus that causes COVID-19 by seeing patients at home rather than in the office."  1. "What is the BEST phone number to call the day of the visit?" - include this in appointment notes  2. Do you have or have access to (through a family member/friend) a smartphone with video capability that we can use for your visit?" a. If yes - list this number in appt notes as cell (if different from BEST phone #) and list the appointment type as a VIDEO visit in appointment notes b. If no - list the appointment type as a PHONE visit in appointment notes  3. Confirm consent - "In the setting of the current Covid19 crisis, you are scheduled for a (phone or video) visit with your provider on (date) at (time).  Just as we do with many in-office visits, in order for you to participate in this visit, we must obtain consent.  If you'd like, I can send this to your mychart (if signed up) or email for you to review.  Otherwise, I can obtain your verbal consent now.  All virtual visits are billed to your insurance company just like a normal visit would be.  By agreeing to a virtual visit, we'd like you to understand that the technology does not allow for your provider to perform an examination, and thus may limit your provider's ability to fully assess your condition. If your provider identifies any concerns that need to be evaluated in person, we will make arrangements to do so.  Finally, though the technology is pretty good, we cannot assure that it will always work on either your or our end, and in the setting of a video visit, we may have to convert it to a phone-only visit.  In either situation, we cannot ensure that we have a secure connection.  Are you willing to proceed?" STAFF: Did the patient verbally acknowledge consent to telehealth visit? Document  YES/NO here: yes  4. Advise patient to be prepared - "Two hours prior to your appointment, go ahead and check your blood pressure, pulse, oxygen saturation, and your weight (if you have the equipment to check those) and write them all down. When your visit starts, your provider will ask you for this information. If you have an Apple Watch or Kardia device, please plan to have heart rate information ready on the day of your appointment. Please have a pen and paper handy nearby the day of the visit as well."  5. Give patient instructions for MyChart download to smartphone OR [email or web address] as below if video visit (depending on what platform provider is using)  6. Inform patient they will receive a phone call 15 minutes prior to their appointment time (may be from unknown caller ID) so they should be prepared to answer    TELEPHONE CALL NOTE  [name] has been deemed a candidate for a follow-up tele-health visit to limit community exposure during the Covid-19 pandemic. I spoke with the patient via phone to ensure availability of phone/video source, confirm preferred email & phone number, and discuss instructions and expectations.  I reminded Scott [name] to be prepared with any vital sign and/or heart rhythm information that could potentially be obtained via home monitoring, at the time of his visit. I reminded Scott [name] to expect a phone call prior to  his visit.  Scott [name] 05/08/2019 12:14 PM   INSTRUCTIONS FOR DOWNLOADING THE MYCHART APP TO SMARTPHONE  - The patient must first make sure to have activated MyChart and know their login information - If Apple, go to [employer] and type in MyChart in the search bar and download the app. If Android, ask patient to go to [employer] and type in [place] in the search bar and download the app. The app is free but as with any other app downloads, their phone may require them to verify saved payment information or  Apple/Android password.  - The patient will need to then log into the app with their MyChart username and password, and select [facility] as their healthcare provider to link the account. When it is time for your visit, go to the MyChart app, find appointments, and click Begin Video Visit. Be sure to Select Allow for your device to access the Microphone and Camera for your visit. You will then be connected, and your provider will be with you shortly.  **If they have any issues connecting, or need assistance please contact MyChart service desk [phone])**  **If using a computer, in order to ensure the best quality for their visit they will need to use either of the following Internet Browsers: [employer], or Google Chrome**  IF USING DOXIMITY or DOXY.ME - The patient will receive a link just prior to their visit by text.     FULL LENGTH CONSENT FOR TELE-HEALTH VISIT   I hereby voluntarily request, consent and authorize [facility] and its employed or contracted physicians, physician assistants, nurse practitioners or other licensed health care professionals (the Practitioner), to provide me with telemedicine health care services (the Services") as deemed necessary by the treating Practitioner. I acknowledge and consent to receive the Services by the Practitioner via telemedicine. I understand that the telemedicine visit will involve communicating with the Practitioner through live audiovisual communication technology and the disclosure of certain medical information by electronic transmission. I acknowledge that I have been given the opportunity to request an in-person assessment or other available alternative prior to the telemedicine visit and am voluntarily participating in the telemedicine visit.  I understand that I have the right to withhold or withdraw my consent to the use of telemedicine in the course of my care at any time, without affecting my right to future care  or treatment, and that the Practitioner or I may terminate the telemedicine visit at any time. I understand that I have the right to inspect all information obtained and/or recorded in the course of the telemedicine visit and may receive copies of available information for a reasonable fee.  I understand that some of the potential risks of receiving the Services via telemedicine include:   Delay or interruption in medical evaluation due to technological equipment failure or disruption;  Information transmitted may not be sufficient (e.g. poor resolution of images) to allow for appropriate medical decision making by the Practitioner; and/or   In rare instances, security protocols could fail, causing a breach of personal health information.  Furthermore, I acknowledge that it is my responsibility to provide information about my medical history, conditions and care that is complete and accurate to the best of my ability. I acknowledge that Practitioner's advice, recommendations, and/or decision may be based on factors not within their control, such as incomplete or inaccurate data provided by me or distortions of diagnostic images or specimens that may result from electronic transmissions. I  understand that the practice of medicine is not an exact science and that Practitioner makes no warranties or guarantees regarding treatment outcomes. I acknowledge that I will receive a copy of this consent concurrently upon execution via email to the email address I last provided but may also request a printed copy by calling the office of [facility].    I understand that my insurance will be billed for this visit.   I have read or had this consent read to me.  I understand the contents of this consent, which adequately explains the benefits and risks of the Services being provided via telemedicine.   I have been provided ample opportunity to ask questions regarding this consent and the Services and have had  my questions answered to my satisfaction.  I give my informed consent for the services to be provided through the use of telemedicine in my medical care  By participating in this telemedicine visit I agree to the above.

## 2019-05-14 ENCOUNTER — Other Ambulatory Visit (HOSPITAL_COMMUNITY): Payer: Self-pay | Admitting: Interventional Radiology

## 2019-05-14 DIAGNOSIS — I771 Stricture of artery: Secondary | ICD-10-CM

## 2019-05-15 ENCOUNTER — Telehealth (INDEPENDENT_AMBULATORY_CARE_PROVIDER_SITE_OTHER): Payer: Medicare Other | Admitting: Cardiovascular Disease

## 2019-05-15 ENCOUNTER — Encounter: Payer: Self-pay | Admitting: Cardiovascular Disease

## 2019-05-15 VITALS — BP 113/66 | HR 86 | Ht 66.0 in | Wt 179.0 lb

## 2019-05-15 DIAGNOSIS — I4819 Other persistent atrial fibrillation: Secondary | ICD-10-CM | POA: Diagnosis not present

## 2019-05-15 DIAGNOSIS — E782 Mixed hyperlipidemia: Secondary | ICD-10-CM

## 2019-05-15 DIAGNOSIS — I10 Essential (primary) hypertension: Secondary | ICD-10-CM

## 2019-05-15 DIAGNOSIS — Z7901 Long term (current) use of anticoagulants: Secondary | ICD-10-CM

## 2019-05-15 DIAGNOSIS — I6509 Occlusion and stenosis of unspecified vertebral artery: Secondary | ICD-10-CM

## 2019-05-15 NOTE — Progress Notes (Signed)
Virtual Visit via Telephone Note   This visit type was conducted due to national recommendations for restrictions regarding the COVID-19 Pandemic (e.g. social distancing) in an effort to limit this patient's exposure and mitigate transmission in our community.  Due to his co-morbid illnesses, this patient is at least at moderate risk for complications without adequate follow up.  This format is felt to be most appropriate for this patient at this time.  The patient did not have access to video technology/had technical difficulties with video requiring transitioning to audio format only (telephone).  All issues noted in this document were discussed and addressed.  No physical exam could be performed with this format.  Please refer to the patient's chart for his  consent to telehealth for [facility].   Date:  05/15/2019   ID:  Scott [name], DOB 05-01-1928, MRN [id number]  Patient Location: Home Provider Location: Office  PCP:  Curlene Labrum, MD  Cardiologist:  Kate Sable, MD  Electrophysiologist:  None   Evaluation Performed:  Follow-Up Visit  Chief Complaint: Persistent atrial fibrillation  History of Present Illness:    Scott [name] is a 85 y.o. male with persistent atrial fibrillation.  He underwent direct-current cardioversion in 2019 and subsequently went back in atrial fibrillation.  At that time, I discussion was held with him about initiating antiarrhythmic therapy and the possibility of repeat direct-current cardioversion but the patient preferred a rate control strategy.  His wife has been having some pulmonary issues. He denies palpitations. He says he has "good days and bad days". He has some shortness of breath which he attributes to being deconditioned. He has chronic back pain which prevents him from walking.  The patient does not have symptoms concerning for COVID-19 infection (fever, chills, cough, or new shortness of breath).    Past Medical  History:  Diagnosis Date  . Anxiety 10/31/2014   due to upcoming procedure  . Arrhythmia   . Arthritis   . Cancer ([place])    Melonoma on nose- removed  . Chronic kidney disease   . Dyspnea   . Dysrhythmia    afib  . Enlarged prostate   . GERD (gastroesophageal reflux disease)   . Headache   . Hyperlipidemia   . Hypertension   . Hypothyroidism   . Overactive bladder   . Stroke [facility])    TIA - "nothing taste right anymore, right eye pain  . TIA (transient ischemic attack)   . Wears glasses    Past Surgical History:  Procedure Laterality Date  . BACK SURGERY  08/2013   x2  . CARDIOVERSION N/A 01/31/2018   Procedure: CARDIOVERSION;  Surgeon: Arnoldo Lenis, MD;  Location: [facility];  Service: Endoscopy;  Laterality: N/A;  . CATARACT EXTRACTION    . COLONOSCOPY    . EYE SURGERY Bilateral    cataract  . IR ANGIO INTRA EXTRACRAN SEL COM CAROTID INNOMINATE BILAT MOD SED  08/04/2017  . IR ANGIO VERTEBRAL SEL VERTEBRAL BILAT MOD SED  08/04/2017  . IR ANGIO VERTEBRAL SEL VERTEBRAL UNI R MOD SED  11/01/2017  . IR RADIOLOGIST EVAL & MGMT  07/28/2017  . IR RADIOLOGIST EVAL & MGMT  08/22/2017  . IR RADIOLOGIST EVAL & MGMT  11/29/2017  . JOINT REPLACEMENT Left 10/2015   hip  . KNEE ARTHROPLASTY Left 1999  . KNEE ARTHROPLASTY Right 2008  . LUMBAR FUSION  06/2014  . RADIOLOGY WITH ANESTHESIA N/A 11/01/2017   Procedure: STENTING;  Surgeon: Estanislado Pandy,  Willaim Rayas, MD;  Location: [place];  Service: Radiology;  Laterality: N/A;  . TRANSURETHRAL RESECTION OF PROSTATE  03/2015  . TRIGGER FINGER RELEASE Right    3rd and 4th     Current Meds  Medication Sig  . acetaminophen (TYLENOL) 500 MG tablet Take 1,000 mg by mouth 2 (two) times daily.   Marland Kitchen apixaban (ELIQUIS) 5 MG TABS tablet Take 1 tablet (5 mg total) by mouth 2 (two) times daily.  Marland Kitchen aspirin EC 81 MG tablet Take 81 mg by mouth at bedtime.   Marland Kitchen atorvastatin (LIPITOR) 10 MG tablet Take 10 mg by mouth every evening.   .  Carboxymethylcellul-Glycerin (LUBRICATING EYE DROPS OP) Place 1-2 drops into both eyes 2 (two) times daily as needed (for dry eyes).   Marland Kitchen diltiazem (CARDIZEM) 60 MG tablet Take 1 tablet (60 mg total) by mouth 2 (two) times daily. (May take an extra 30mg  in the evening as needed)  Med increased 10/26/2017.   PLACE ON FILE  . doxazosin (CARDURA) 8 MG tablet Take 8 mg by mouth at bedtime.  . finasteride (PROSCAR) 5 MG tablet Take 5 mg by mouth daily.  . fluticasone (FLONASE) 50 MCG/ACT nasal spray Place 2 sprays into both nostrils daily as needed for allergies or rhinitis.   Marland Kitchen labetalol (NORMODYNE) 200 MG tablet Take 200 mg by mouth 2 (two) times daily.  Marland Kitchen levothyroxine (SYNTHROID, LEVOTHROID) 150 MCG tablet Take 150 mcg daily before breakfast by mouth.  Marland Kitchen lisinopril (PRINIVIL,ZESTRIL) 40 MG tablet Take 40 mg by mouth daily.  Marland Kitchen omeprazole (PRILOSEC) 20 MG capsule Take 20 mg by mouth daily.  Marland Kitchen oxyCODONE-acetaminophen (PERCOCET) 10-325 MG tablet Take 1 tablet by mouth 2 (two) times daily as needed.  . trolamine salicylate (ASPERCREME) 10 % cream Apply 1 application topically as needed for muscle pain.     Allergies:   Tramadol   Social History   Tobacco Use  . Smoking status: Former Smoker    Years: 40.00    Types: Cigarettes    Start date: 10/17/1965    Quit date: 10/17/1978    Years since quitting: 40.6  . Smokeless tobacco: Never Used  Substance Use Topics  . Alcohol use: No    Alcohol/week: 0.0 standard drinks  . Drug use: No     Family Hx: The patient's family history includes Coronary artery disease in his mother.  ROS:   Please see the history of present illness.     All other systems reviewed and are negative.   Prior CV studies:   The following studies were reviewed today:  NA  Labs/Other Tests and Data Reviewed:    EKG:  No ECG reviewed.  Recent Labs: 09/18/2018: Creatinine, Ser 1.40   Recent Lipid Panel No results found for: CHOL, TRIG, HDL, CHOLHDL, LDLCALC,  LDLDIRECT  Wt Readings from Last 3 Encounters:  05/15/19 179 lb (81.2 kg)  11/05/18 186 lb (84.4 kg)  07/18/18 177 lb (80.3 kg)     Objective:    Vital Signs:  BP 113/66   Pulse 86   Ht 5\' 6"  (1.676 m)   Wt 179 lb (81.2 kg)   SpO2 95% Comment: on room air  BMI 28.89 kg/m    VITAL SIGNS:  reviewed  ASSESSMENT & PLAN:    1.  Persistent atrial fibrillation: Symptomatically stable.He underwent direct-current cardioversion on 01/31/2018. He subsequently went back into atrial fibrillation. A discussion was previously held with him about initiating antiarrhythmic therapy and the possibility of repeat direct-current cardioversion  but the patient preferred a rate control strategy.  He is on diltiazem, labetalol, and apixaban.  We previously had a long discussion about the differences between systemic anticoagulation versus antiplatelet therapy.  2. Hypertension: Blood pressure is normal. No change to therapy.  3. Hyperlipidemia: Remains on atorvastatin 10 mg daily. No changes.  4. Vertebrobasilar artery stenosis: He has known right-sided stenosis and this is followed by Dr. Estanislado Pandy. We previously had a long discussion about the differences between systemic anticoagulation versus antiplatelet therapy.    COVID-19 Education: The signs and symptoms of COVID-19 were discussed with the patient and how to seek care for testing (follow up with PCP or arrange E-visit).  The importance of social distancing was discussed today.  Time:   Today, I have spent 10 minutes with the patient with telehealth technology discussing the above problems.     Medication Adjustments/Labs and Tests Ordered: Current medicines are reviewed at length with the patient today.  Concerns regarding medicines are outlined above.   Tests Ordered: No orders of the defined types were placed in this encounter.   Medication Changes: No orders of the defined types were placed in this encounter.   Follow  Up: In office in 6 months with APP  Signed, Kate Sable, MD  05/15/2019 1:18 PM    [place]

## 2019-05-15 NOTE — Patient Instructions (Addendum)

## 2019-05-24 DIAGNOSIS — G459 Transient cerebral ischemic attack, unspecified: Secondary | ICD-10-CM | POA: Diagnosis not present

## 2019-05-24 DIAGNOSIS — I10 Essential (primary) hypertension: Secondary | ICD-10-CM | POA: Diagnosis not present

## 2019-05-24 DIAGNOSIS — N182 Chronic kidney disease, stage 2 (mild): Secondary | ICD-10-CM | POA: Diagnosis not present

## 2019-05-24 DIAGNOSIS — I480 Paroxysmal atrial fibrillation: Secondary | ICD-10-CM | POA: Diagnosis not present

## 2019-05-24 DIAGNOSIS — E039 Hypothyroidism, unspecified: Secondary | ICD-10-CM | POA: Diagnosis not present

## 2019-05-24 DIAGNOSIS — E559 Vitamin D deficiency, unspecified: Secondary | ICD-10-CM | POA: Diagnosis not present

## 2019-05-24 DIAGNOSIS — I700 Atherosclerosis of aorta: Secondary | ICD-10-CM | POA: Diagnosis not present

## 2019-05-28 DIAGNOSIS — H52223 Regular astigmatism, bilateral: Secondary | ICD-10-CM | POA: Diagnosis not present

## 2019-05-28 DIAGNOSIS — I6521 Occlusion and stenosis of right carotid artery: Secondary | ICD-10-CM | POA: Diagnosis not present

## 2019-05-28 DIAGNOSIS — H5203 Hypermetropia, bilateral: Secondary | ICD-10-CM | POA: Diagnosis not present

## 2019-05-28 DIAGNOSIS — H3530 Unspecified macular degeneration: Secondary | ICD-10-CM | POA: Diagnosis not present

## 2019-05-28 DIAGNOSIS — N182 Chronic kidney disease, stage 2 (mild): Secondary | ICD-10-CM | POA: Diagnosis not present

## 2019-05-28 DIAGNOSIS — H353132 Nonexudative age-related macular degeneration, bilateral, intermediate dry stage: Secondary | ICD-10-CM | POA: Diagnosis not present

## 2019-05-28 DIAGNOSIS — E039 Hypothyroidism, unspecified: Secondary | ICD-10-CM | POA: Diagnosis not present

## 2019-05-28 DIAGNOSIS — Z9849 Cataract extraction status, unspecified eye: Secondary | ICD-10-CM | POA: Diagnosis not present

## 2019-05-28 DIAGNOSIS — R55 Syncope and collapse: Secondary | ICD-10-CM | POA: Diagnosis not present

## 2019-05-28 DIAGNOSIS — I480 Paroxysmal atrial fibrillation: Secondary | ICD-10-CM | POA: Diagnosis not present

## 2019-05-28 DIAGNOSIS — H43813 Vitreous degeneration, bilateral: Secondary | ICD-10-CM | POA: Diagnosis not present

## 2019-05-28 DIAGNOSIS — Z6827 Body mass index (BMI) 27.0-27.9, adult: Secondary | ICD-10-CM | POA: Diagnosis not present

## 2019-05-28 DIAGNOSIS — H524 Presbyopia: Secondary | ICD-10-CM | POA: Diagnosis not present

## 2019-05-28 DIAGNOSIS — I10 Essential (primary) hypertension: Secondary | ICD-10-CM | POA: Diagnosis not present

## 2019-05-28 DIAGNOSIS — I69398 Other sequelae of cerebral infarction: Secondary | ICD-10-CM | POA: Diagnosis not present

## 2019-05-28 DIAGNOSIS — Z961 Presence of intraocular lens: Secondary | ICD-10-CM | POA: Diagnosis not present

## 2019-05-30 ENCOUNTER — Ambulatory Visit (HOSPITAL_COMMUNITY): Payer: Medicare Other

## 2019-05-30 ENCOUNTER — Encounter (HOSPITAL_COMMUNITY): Payer: Self-pay

## 2019-05-30 ENCOUNTER — Ambulatory Visit (HOSPITAL_COMMUNITY)
Admission: RE | Admit: 2019-05-30 | Discharge: 2019-05-30 | Disposition: A | Discharge status: Home / Self Care | Payer: Medicare Other | Source: Ambulatory Visit | Attending: Interventional Radiology | Admitting: Interventional Radiology

## 2019-05-30 ENCOUNTER — Other Ambulatory Visit: Payer: Self-pay

## 2019-05-30 DIAGNOSIS — I6501 Occlusion and stenosis of right vertebral artery: Secondary | ICD-10-CM | POA: Diagnosis not present

## 2019-05-30 DIAGNOSIS — I6523 Occlusion and stenosis of bilateral carotid arteries: Secondary | ICD-10-CM | POA: Diagnosis not present

## 2019-05-30 DIAGNOSIS — I771 Stricture of artery: Secondary | ICD-10-CM

## 2019-05-30 LAB — POCT I-STAT CREATININE: Creatinine, Ser: 1.4 mg/dL — ABNORMAL HIGH (ref 0.61–1.24)

## 2019-05-30 IMAGING — CT CT ANGIOGRAPHY NECK
1 of 11 series · 5 of 33 positions shown · IV contrast (OMNI 350)
Comparison: Head CT [DATE]. Catheter angiography report
[DATE], images not available

CLINICAL DATA: Arterial stenosis.

EXAM:
CT ANGIOGRAPHY HEAD AND NECK
TECHNIQUE: Multidetector CT imaging of the head and neck was performed using
the standard protocol during bolus administration of intravenous
contrast. Multiplanar CT image reconstructions and MIPs were
obtained to evaluate the vascular anatomy. Carotid stenosis
measurements (when applicable) are obtained utilizing NASCET
criteria, using the distal internal carotid diameter as the
denominator.
CONTRAST:  100mL OMNIPAQUE IOHEXOL 350 MG/ML SOLN

[Series 12: cta neck axial · axial · 0.39mm/px · z∈[-268,-16]mm · 5 of 378 slices shown]
[im 63/378  soft-tissue]
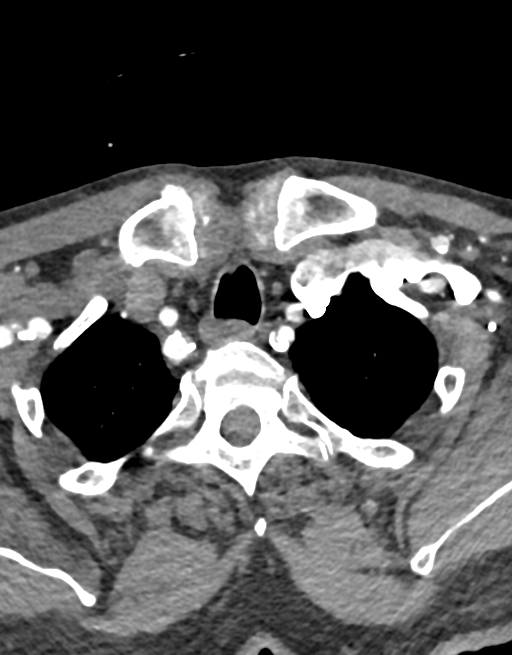
[im 126/378  bone]
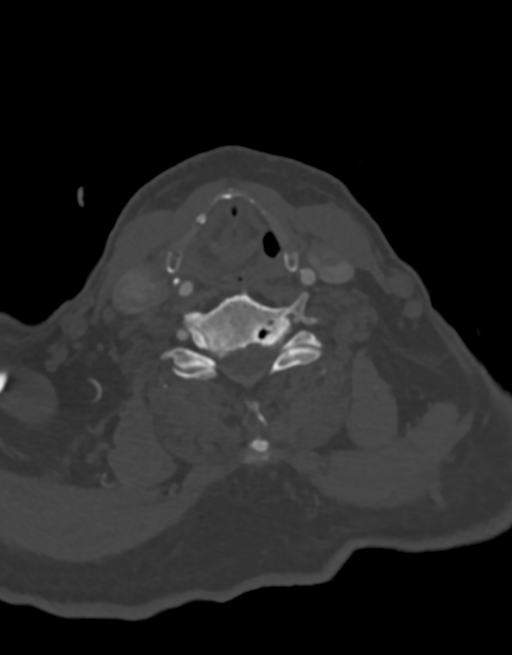
[im 189/378  soft-tissue]
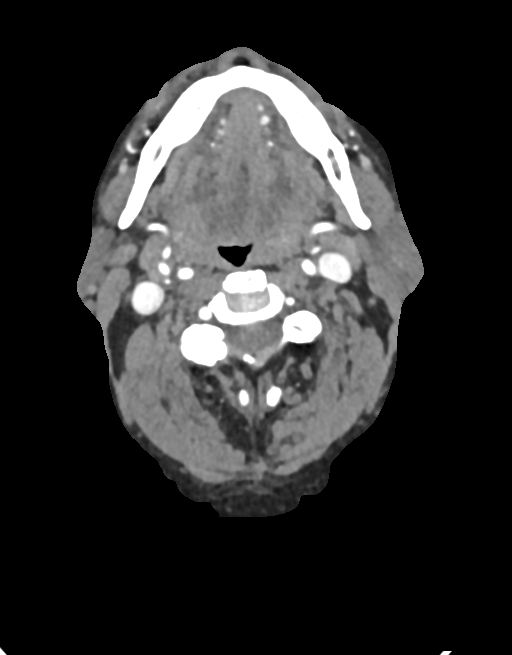
[im 252/378  bone]
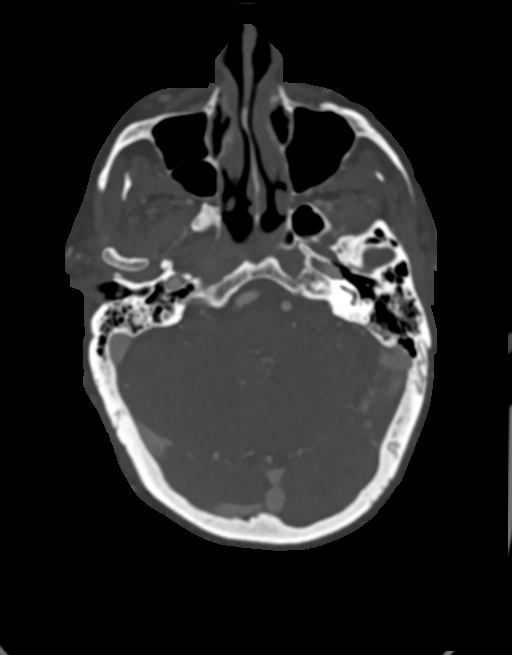
[im 315/378  soft-tissue]
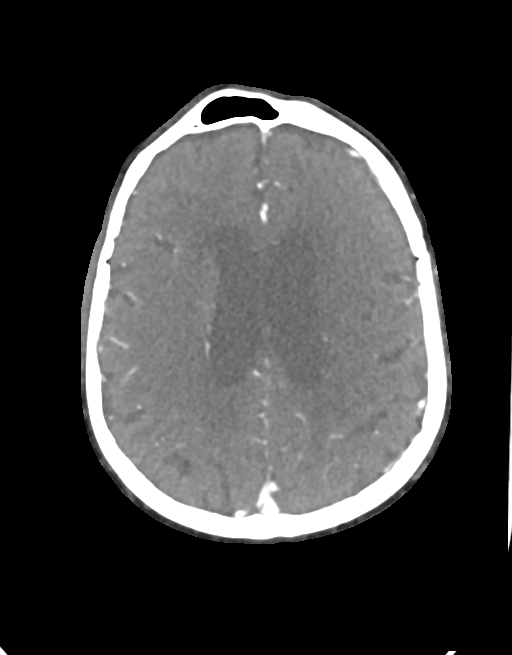

[5 of 33 positions shown; findings below may reference images not displayed]

FINDINGS: CT HEAD FINDINGS

Brain: Remote lacunar infarct in the left thalamus. Small remote
bilateral cerebellar infarcts. Confluent ischemic gliosis in the
deep cerebral white matter. Central predominant atrophy. No acute
hemorrhage, hydrocephalus, or collection.

Vascular: See below

Skull: No acute or aggressive finding.

Sinuses: Chronic right sphenoid sinusitis with high-density debris
and sclerotic wall thickening.

Orbits: Bilateral cataract resection.

Review of the MIP images confirms the above findings

CTA NECK FINDINGS

Aortic arch: Extensive atherosclerotic plaque. No visible dissection
or aneurysm.

Right carotid system: Atheromatous wall thickening of the common
carotid with mild luminal narrowing. Bulky calcified plaque at the
ICA bulb with 55% stenosis on sagittal reformats. No ulceration or
beading.

Left carotid system: Atheromatous plaque along the common carotid
proximal ICA without flow limiting stenosis or ulceration. Negative
for beading.

Vertebral arteries: Proximal subclavian plaque on both sides without
flow limiting stenosis. Calcified plaque at the origin of both
vertebral arteries with high-grade narrowing on the left more than
right.

Skeleton: No acute or aggressive finding

Other neck: No significant finding

Upper chest: Negative

Review of the MIP images confirms the above findings

CTA HEAD FINDINGS

Anterior circulation: Atherosclerotic plaque along both carotid
siphons. No branch occlusion or flow limiting stenosis. Negative for
aneurysm

Posterior circulation: 70% narrowing of the dominant right vertebral
artery at the V4 segment. Most of left vertebral flow is to the
PICA, although there is small volume left vertebral contribution to
the basilar. Small posterior communicating artery on the right and
moderate vessel size on the left. Left P1 hypoplasia.

Venous sinuses: Patent

Anatomic variants: As above

Review of the MIP images confirms the above findings
IMPRESSION: 1. 70% narrowing at the right V4 segment which is dominant.
2. Subjective high-grade narrowing at both vertebral ostia.
3. 55% atheromatous narrowing the right ICA bulb.
4. Chronic small vessel ischemia and small remote posterior
circulation infarcts.

## 2019-05-30 MED ORDER — IOHEXOL 350 MG/ML SOLN
100.0000 mL | Freq: Once | INTRAVENOUS | Status: AC | PRN
  Administered 2019-05-30: 100 mL via INTRAVENOUS

## 2019-06-05 ENCOUNTER — Telehealth (HOSPITAL_COMMUNITY): Payer: Self-pay

## 2019-06-05 NOTE — Telephone Encounter (Signed)
Called pt regarding recent cta, wife answered. She will have him call back today once he returns home. AW

## 2019-06-06 ENCOUNTER — Telehealth (HOSPITAL_COMMUNITY): Payer: Self-pay

## 2019-06-06 NOTE — Telephone Encounter (Signed)
Pt agreed to f/u in 6 months with CTA head/neck. He is not currently having any sx. He has dizziness but he has always had that. He will call if it gets worse. AW

## 2019-07-25 DIAGNOSIS — Z23 Encounter for immunization: Secondary | ICD-10-CM | POA: Diagnosis not present

## 2019-08-20 DIAGNOSIS — E039 Hypothyroidism, unspecified: Secondary | ICD-10-CM | POA: Diagnosis not present

## 2019-08-20 DIAGNOSIS — N182 Chronic kidney disease, stage 2 (mild): Secondary | ICD-10-CM | POA: Diagnosis not present

## 2019-08-28 DIAGNOSIS — N182 Chronic kidney disease, stage 2 (mild): Secondary | ICD-10-CM | POA: Diagnosis not present

## 2019-08-28 DIAGNOSIS — I480 Paroxysmal atrial fibrillation: Secondary | ICD-10-CM | POA: Diagnosis not present

## 2019-08-28 DIAGNOSIS — I6509 Occlusion and stenosis of unspecified vertebral artery: Secondary | ICD-10-CM | POA: Diagnosis not present

## 2019-08-28 DIAGNOSIS — I69398 Other sequelae of cerebral infarction: Secondary | ICD-10-CM | POA: Diagnosis not present

## 2019-08-28 DIAGNOSIS — I700 Atherosclerosis of aorta: Secondary | ICD-10-CM | POA: Diagnosis not present

## 2019-08-28 DIAGNOSIS — I10 Essential (primary) hypertension: Secondary | ICD-10-CM | POA: Diagnosis not present

## 2019-08-28 DIAGNOSIS — Z6826 Body mass index (BMI) 26.0-26.9, adult: Secondary | ICD-10-CM | POA: Diagnosis not present

## 2019-08-28 DIAGNOSIS — I6521 Occlusion and stenosis of right carotid artery: Secondary | ICD-10-CM | POA: Diagnosis not present

## 2019-09-24 ENCOUNTER — Telehealth (HOSPITAL_COMMUNITY): Payer: Self-pay

## 2019-09-24 ENCOUNTER — Other Ambulatory Visit (HOSPITAL_COMMUNITY): Payer: Self-pay | Admitting: Interventional Radiology

## 2019-09-24 DIAGNOSIS — I771 Stricture of artery: Secondary | ICD-10-CM

## 2019-09-24 DIAGNOSIS — R42 Dizziness and giddiness: Secondary | ICD-10-CM

## 2019-09-24 NOTE — Telephone Encounter (Signed)
Pt called with worsening dizziness and neck pain. No headache, nausea, weakness. I have sent a message to our PA for review and will call pt back. He agrees with this plan.

## 2019-10-02 ENCOUNTER — Other Ambulatory Visit: Payer: Self-pay

## 2019-10-02 ENCOUNTER — Ambulatory Visit (HOSPITAL_COMMUNITY)
Admission: RE | Admit: 2019-10-02 | Discharge: 2019-10-02 | Disposition: A | Discharge status: Home / Self Care | Payer: Medicare Other | Source: Ambulatory Visit | Attending: Interventional Radiology | Admitting: Interventional Radiology

## 2019-10-02 DIAGNOSIS — R42 Dizziness and giddiness: Secondary | ICD-10-CM | POA: Diagnosis not present

## 2019-10-02 DIAGNOSIS — I6503 Occlusion and stenosis of bilateral vertebral arteries: Secondary | ICD-10-CM | POA: Diagnosis not present

## 2019-10-02 DIAGNOSIS — I771 Stricture of artery: Secondary | ICD-10-CM

## 2019-10-02 DIAGNOSIS — R9082 White matter disease, unspecified: Secondary | ICD-10-CM | POA: Diagnosis not present

## 2019-10-02 IMAGING — MR MR HEAD W/O CM
5 of 10 series · 23 of 48 positions shown · non-contrast
Comparison: CTA of the head and neck [DATE]

CLINICAL DATA: Dizziness. Left arm weakness. Left leg weakness.
Vertebral artery stenosis.

EXAM:
MRI HEAD WITHOUT CONTRAST
MRA HEAD WITHOUT CONTRAST
TECHNIQUE: Multiplanar, multiecho pulse sequences of the brain and surrounding
structures were obtained without intravenous contrast. Angiographic
images of the head were obtained using MRA technique without
contrast.

[Series 2: DWI · axial · 3.0mm · 0.78mm/px · z∈[-82,+72]mm · 6 of 55 slices shown (1 of 2)]
[im 1/55]
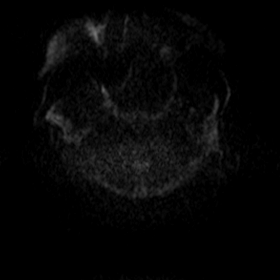
[im 11/55]
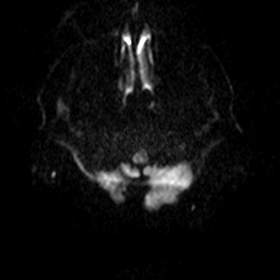
[im 22/55]
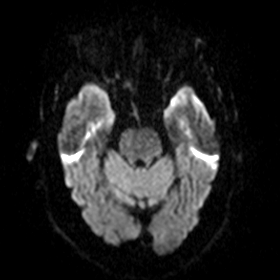
[im 33/55]
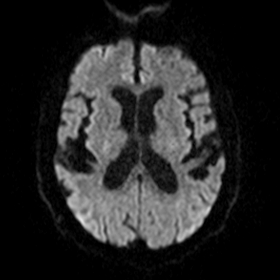
[im 44/55]
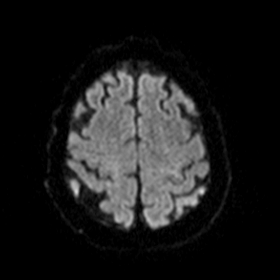
[im 55/55]
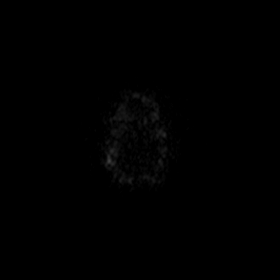

[Series 5: DWI · coronal · 5.0mm · 0.50mm/px · 4 of 38 slices shown (2 of 2)]
[im 1/38]
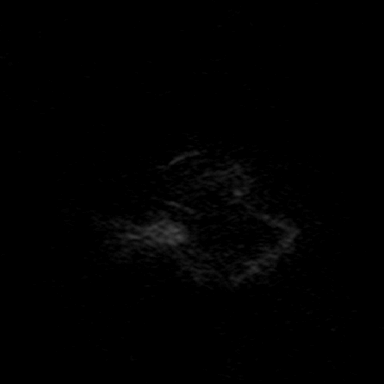
[im 13/38]
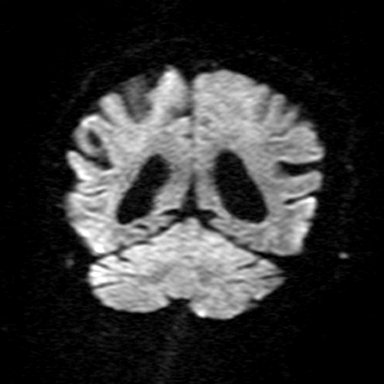
[im 25/38]
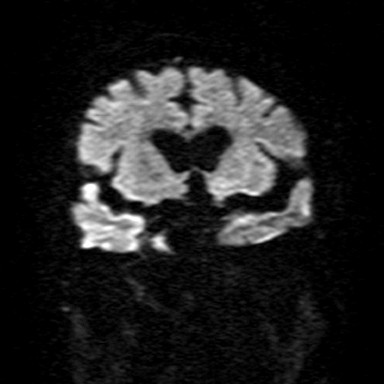
[im 38/38]
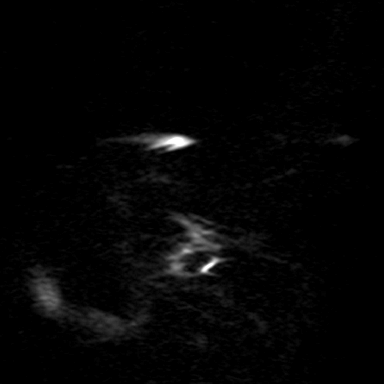

[Series 7: T2 · axial · 5.0mm · 0.50mm/px · z∈[-64,+72]mm · 3 of 23 slices shown (1 of 2)]
[im 1/23]
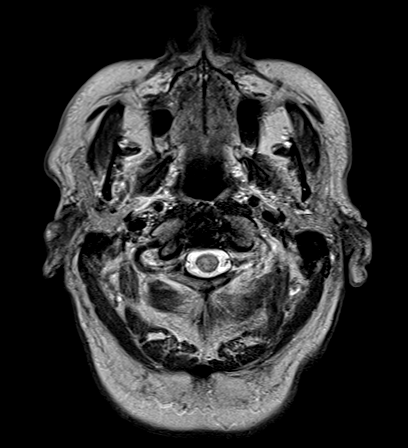
[im 12/23]
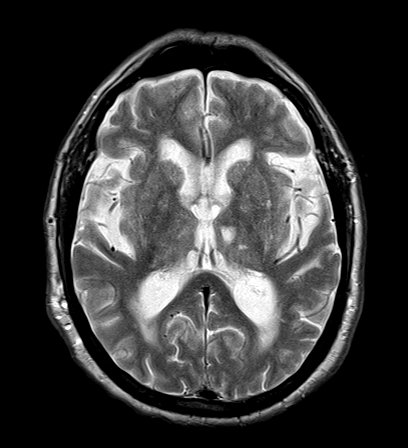
[im 23/23]
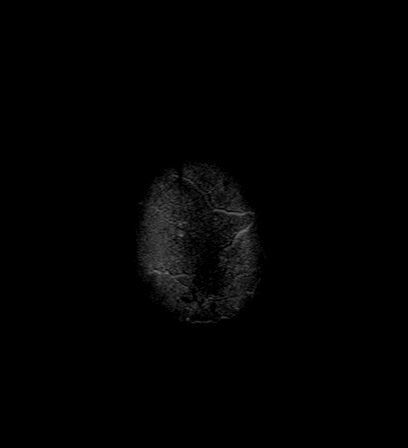

[Series 8: T2 · axial · 4.0mm · 0.44mm/px · z∈[-64,+74]mm · 4 of 30 slices shown (2 of 2)]
[im 1/30]
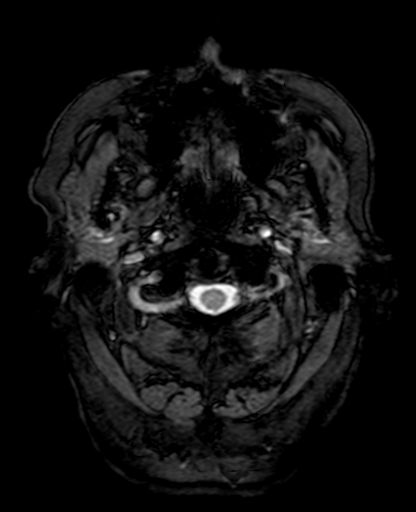
[im 10/30]
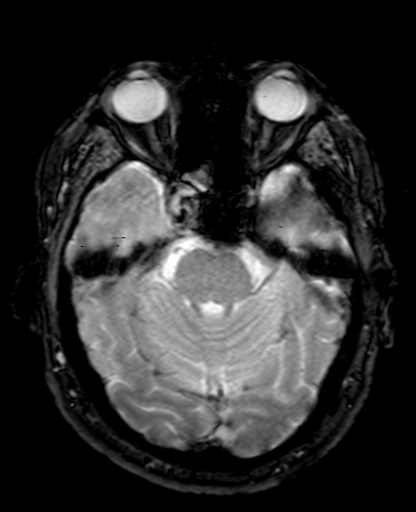
[im 20/30]
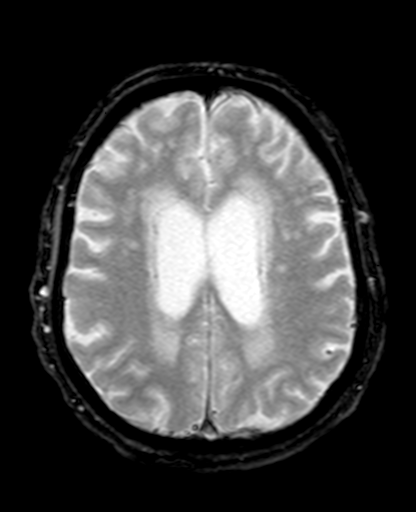
[im 30/30]
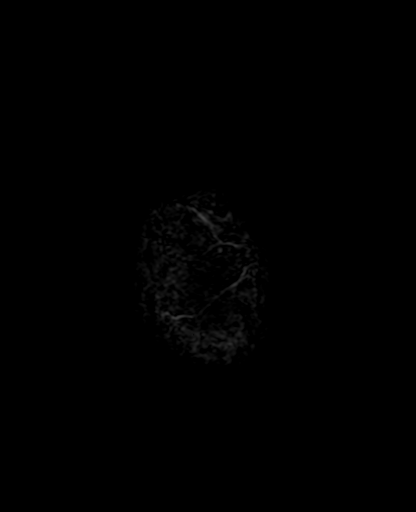

[Series 9: FLAIR · axial · 3.0mm · 0.34mm/px · z∈[-62,+70]mm · 6 of 47 slices shown]
[im 1/47]
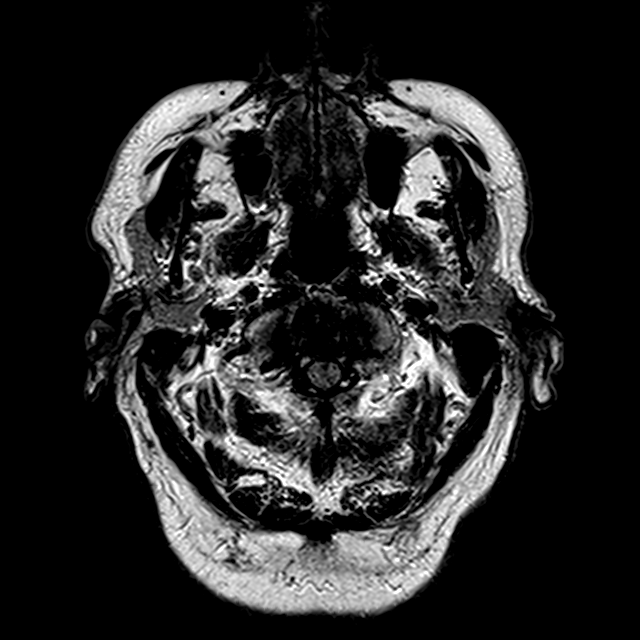
[im 10/47]
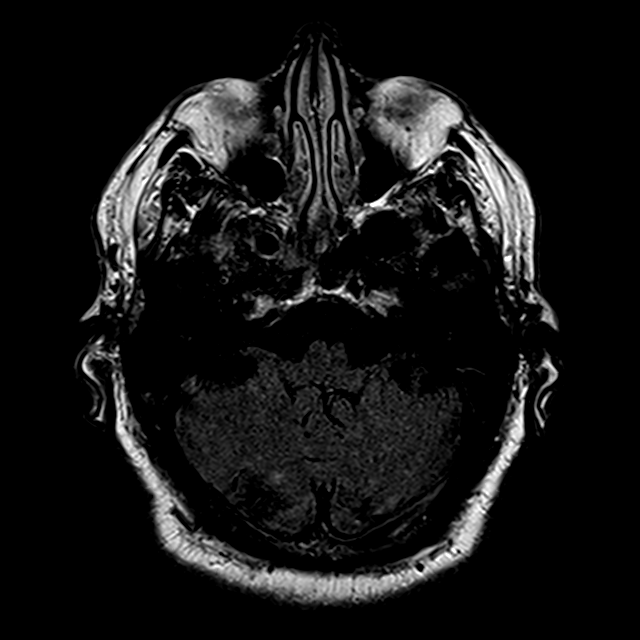
[im 19/47]
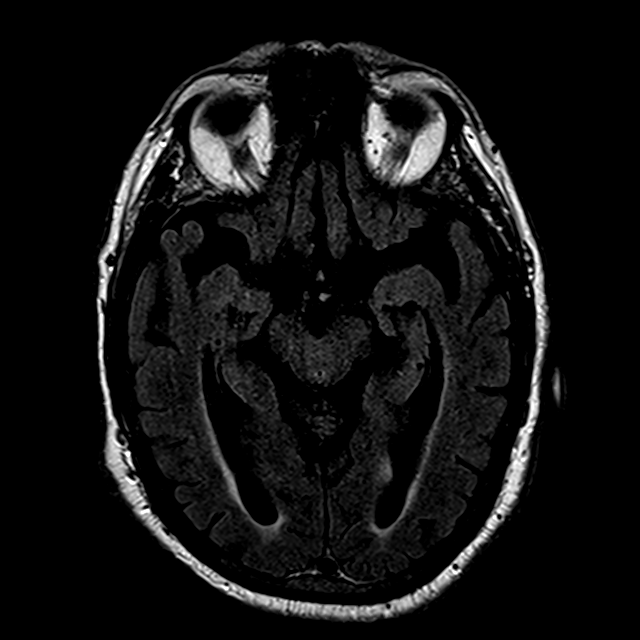
[im 28/47]
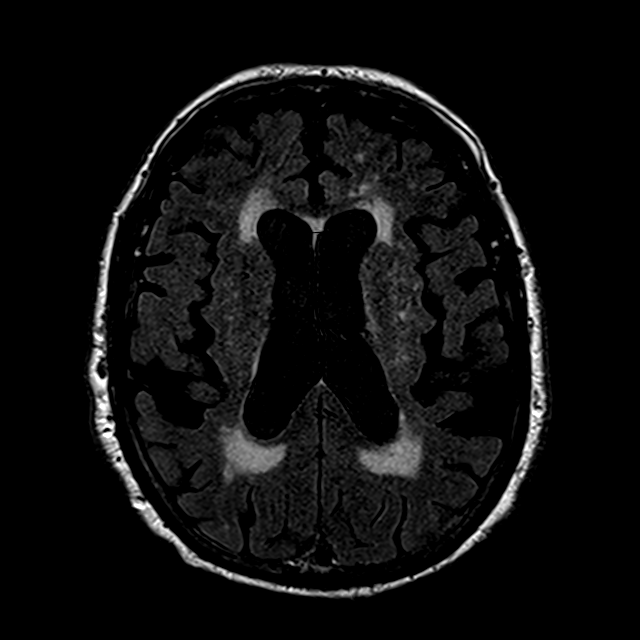
[im 37/47]
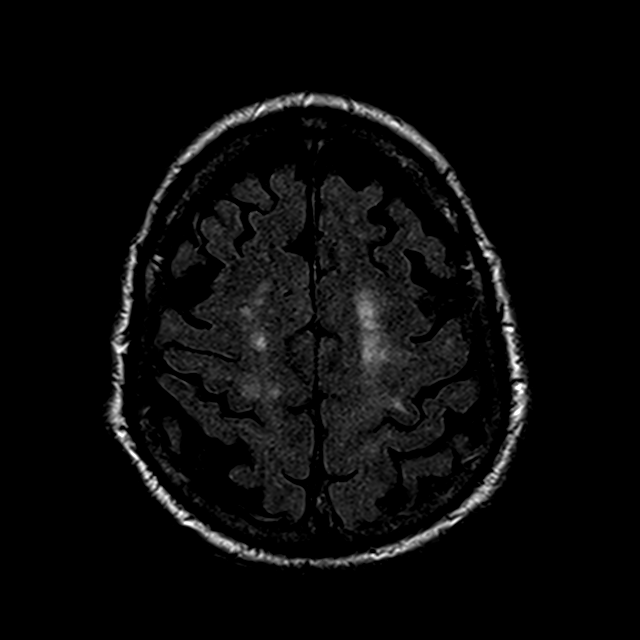
[im 47/47]
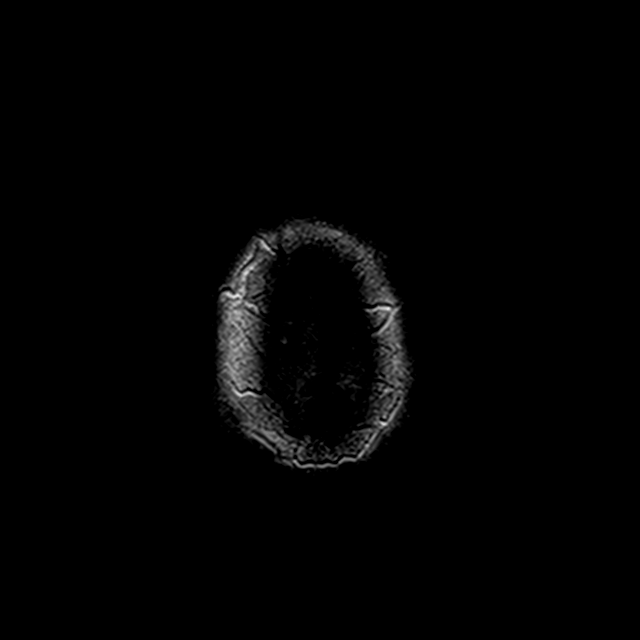

[23 of 48 positions shown; findings below may reference images not displayed]

FINDINGS: MRI HEAD FINDINGS

Brain: Remote lacunar infarcts are again noted within the left
greater than right cerebellum. Remote lacunar infarcts are also
present in the thalami, left greater than right. Moderate confluent
periventricular T2 changes are present. Diffuse subcortical white
matter hyperintensities are present as well. Dilated perivascular
spaces are present throughout the basal ganglia. The brainstem is
normal. Internal auditory canals are within normal limits.

Vascular: Flow is present in the major intracranial arteries.

Skull and upper cervical spine: The craniocervical junction is
normal. Upper cervical spine is within normal limits. Marrow signal
is unremarkable.

Sinuses/Orbits: Chronic right sphenoid sinus disease is again noted.
The paranasal sinuses and mastoid air cells are otherwise clear.
Bilateral lens replacements are noted. Globes and orbits are
otherwise unremarkable.

MRA HEAD FINDINGS

The internal carotid arteries are within normal limits from the high
cervical segments through the ICA termini bilaterally. The A1 and M1
segments are normal. MCA bifurcations are intact. Signal loss in the
proximal inferior M2 divisions is felt to be artifactual. ACA and
MCA branch vessels are within normal limits.

High-grade stenosis of the V4 segment of the dominant right
vertebral artery is again noted. There is signal loss in the distal
left vertebral artery just proximal to the vertebrobasilar junction
suggesting additional high-grade stenosis. Signal loss in the
proximal right P2 segment is felt to be artifactual. A left
posterior communicating artery is noted. Vessel irregularity of the
PCAs bilaterally is felt to be artifactual.
IMPRESSION: 1. No acute intracranial abnormality or significant interval change.
2. Remote lacunar infarcts of the cerebellum bilaterally, left
greater than right.
3. Remote lacunar infarcts of the thalami, left greater than right.
4. High-grade stenosis of the V4 segment of the dominant right
vertebral artery.
5. High-grade stenosis of the distal left vertebral artery just
proximal to the vertebrobasilar junction.
6. Probable artifactual signal loss in the inferior MCA branches in
PCA branches bilaterally. CTA of the head would be more sensitive
and specific for the evaluation of intracranial disease in this
patient.
7. Moderate diffuse subcortical white matter disease bilaterally
likely reflects the sequela of chronic microvascular ischemia.

## 2019-10-02 IMAGING — MR MR MRA HEAD W/O CM
1 series · 15 of 48 positions shown · non-contrast
Comparison: CTA of the head and neck [DATE]

CLINICAL DATA: Dizziness. Left arm weakness. Left leg weakness.
Vertebral artery stenosis.

EXAM:
MRI HEAD WITHOUT CONTRAST
MRA HEAD WITHOUT CONTRAST
TECHNIQUE: Multiplanar, multiecho pulse sequences of the brain and surrounding
structures were obtained without intravenous contrast. Angiographic
images of the head were obtained using MRA technique without
contrast.

[Series 1: TOF fat-sat · axial · 0.8mm · 0.36mm/px · z∈[-56,+37]mm · 15 of 131 slices shown]
[im 1/131]
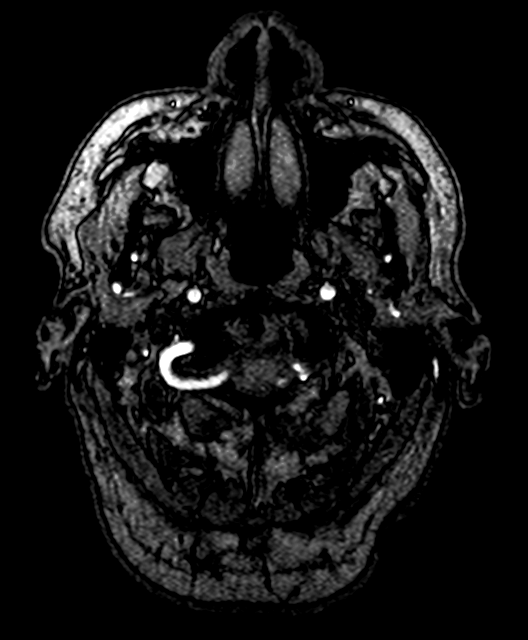
[im 3/131]
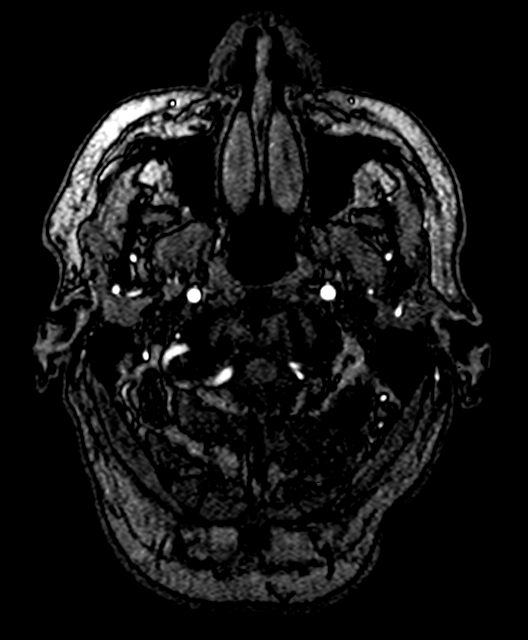
[im 6/131]
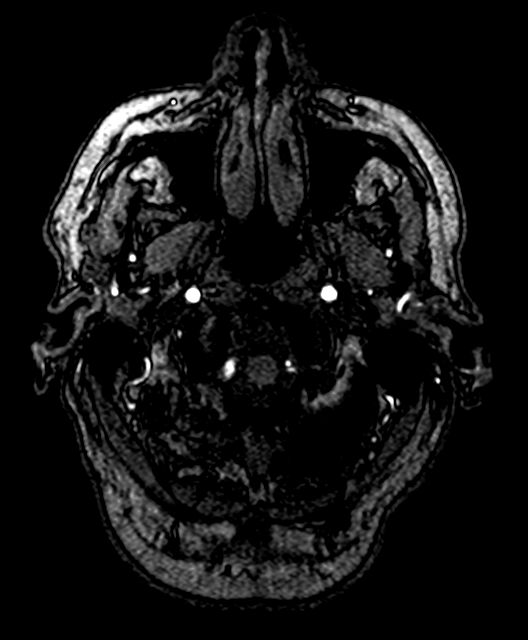
[im 9/131]
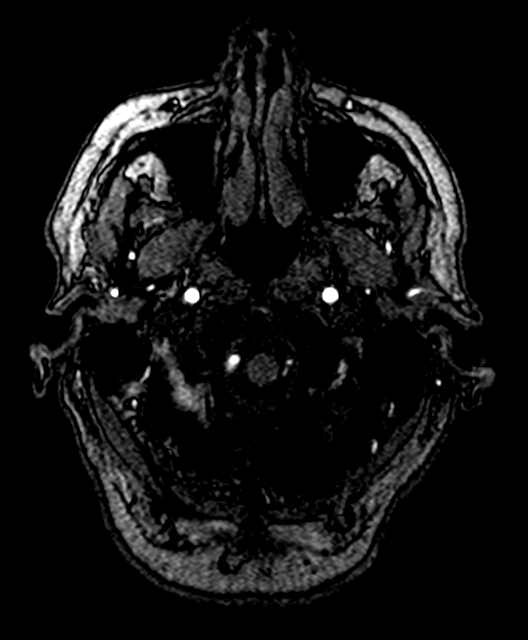
[im 12/131]
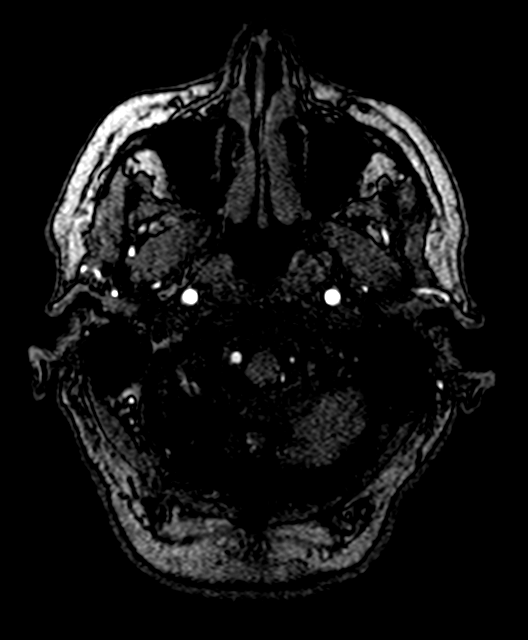
[im 23/131]
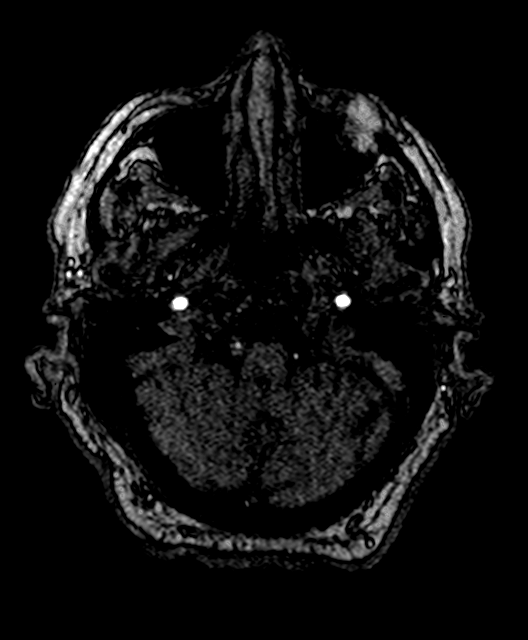
[im 25/131]
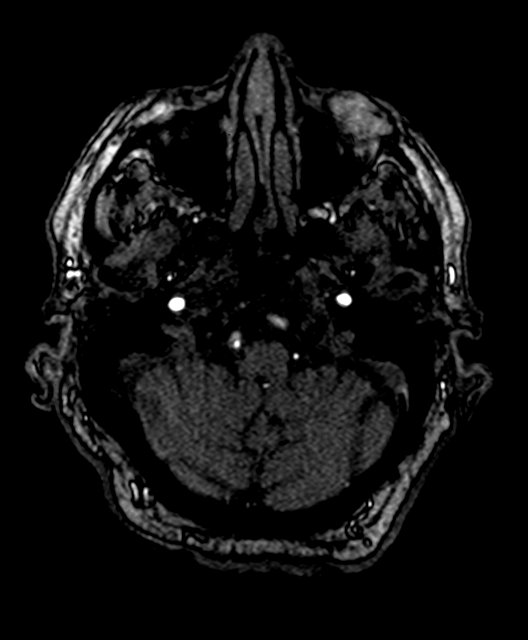
[im 42/131]
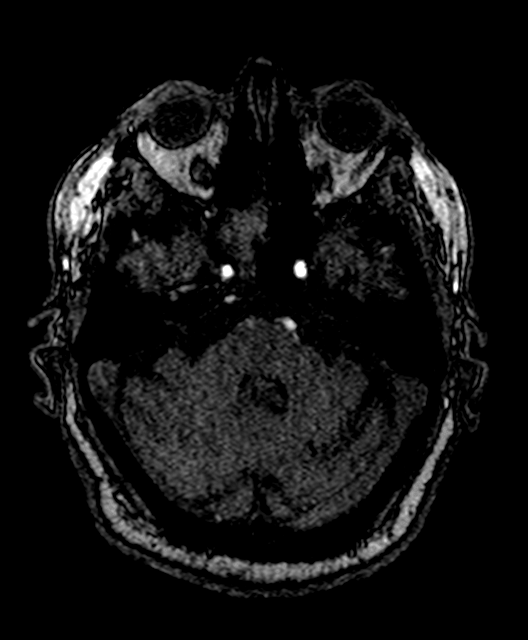
[im 59/131]
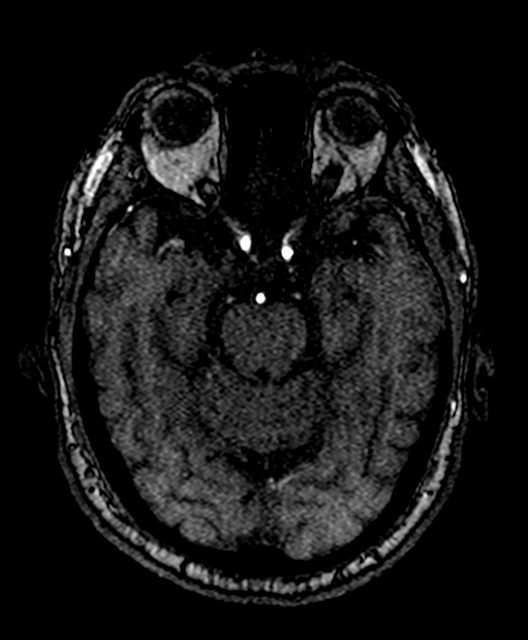
[im 67/131]
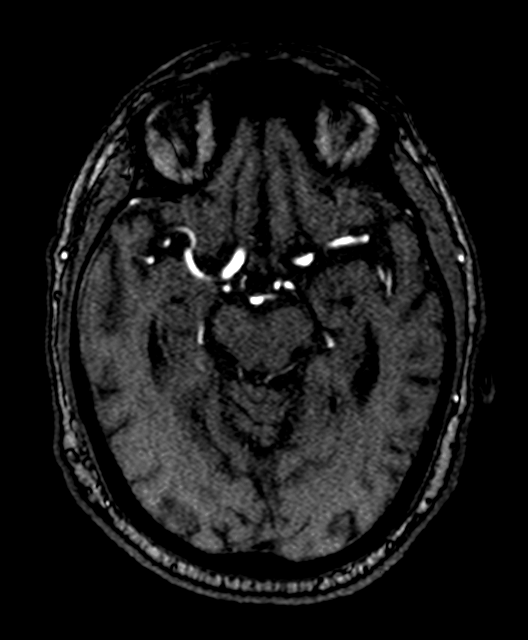
[im 75/131]
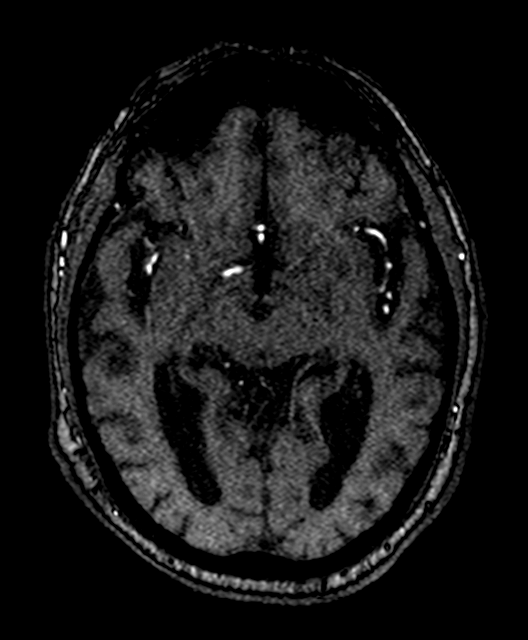
[im 92/131]
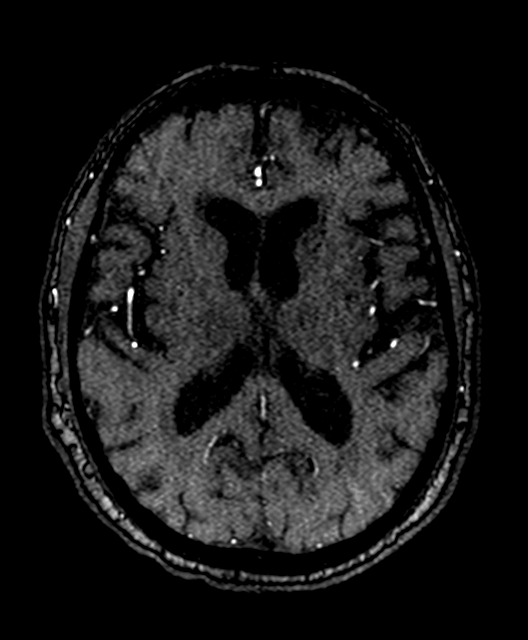
[im 108/131]
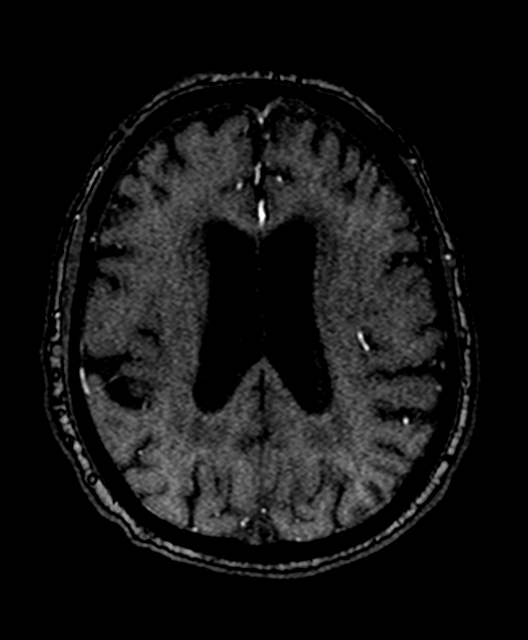
[im 111/131]
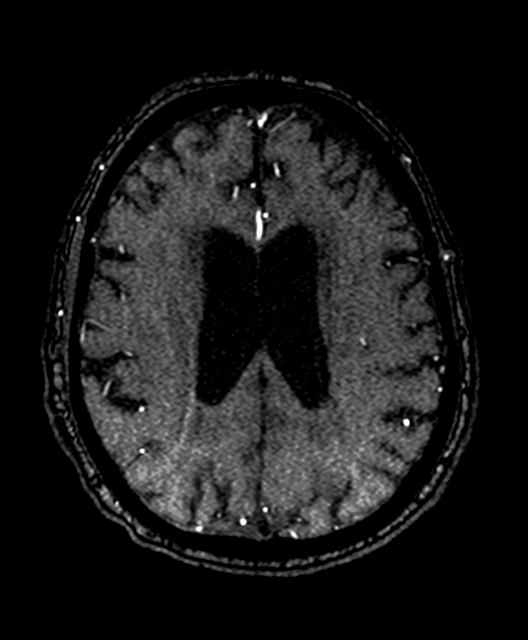
[im 125/131]
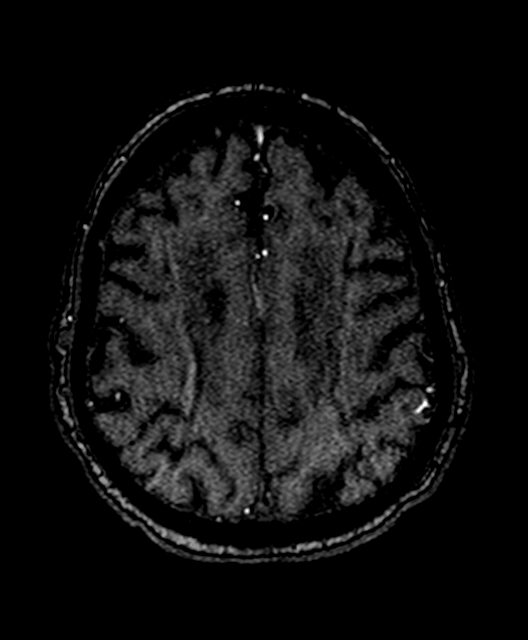

[15 of 48 positions shown; findings below may reference images not displayed]

FINDINGS: MRI HEAD FINDINGS

Brain: Remote lacunar infarcts are again noted within the left
greater than right cerebellum. Remote lacunar infarcts are also
present in the thalami, left greater than right. Moderate confluent
periventricular T2 changes are present. Diffuse subcortical white
matter hyperintensities are present as well. Dilated perivascular
spaces are present throughout the basal ganglia. The brainstem is
normal. Internal auditory canals are within normal limits.

Vascular: Flow is present in the major intracranial arteries.

Skull and upper cervical spine: The craniocervical junction is
normal. Upper cervical spine is within normal limits. Marrow signal
is unremarkable.

Sinuses/Orbits: Chronic right sphenoid sinus disease is again noted.
The paranasal sinuses and mastoid air cells are otherwise clear.
Bilateral lens replacements are noted. Globes and orbits are
otherwise unremarkable.

MRA HEAD FINDINGS

The internal carotid arteries are within normal limits from the high
cervical segments through the ICA termini bilaterally. The A1 and M1
segments are normal. MCA bifurcations are intact. Signal loss in the
proximal inferior M2 divisions is felt to be artifactual. ACA and
MCA branch vessels are within normal limits.

High-grade stenosis of the V4 segment of the dominant right
vertebral artery is again noted. There is signal loss in the distal
left vertebral artery just proximal to the vertebrobasilar junction
suggesting additional high-grade stenosis. Signal loss in the
proximal right P2 segment is felt to be artifactual. A left
posterior communicating artery is noted. Vessel irregularity of the
PCAs bilaterally is felt to be artifactual.
IMPRESSION: 1. No acute intracranial abnormality or significant interval change.
2. Remote lacunar infarcts of the cerebellum bilaterally, left
greater than right.
3. Remote lacunar infarcts of the thalami, left greater than right.
4. High-grade stenosis of the V4 segment of the dominant right
vertebral artery.
5. High-grade stenosis of the distal left vertebral artery just
proximal to the vertebrobasilar junction.
6. Probable artifactual signal loss in the inferior MCA branches in
PCA branches bilaterally. CTA of the head would be more sensitive
and specific for the evaluation of intracranial disease in this
patient.
7. Moderate diffuse subcortical white matter disease bilaterally
likely reflects the sequela of chronic microvascular ischemia.

## 2019-10-03 ENCOUNTER — Ambulatory Visit (HOSPITAL_COMMUNITY): Payer: Medicare Other

## 2019-10-03 ENCOUNTER — Encounter (HOSPITAL_COMMUNITY): Payer: Self-pay

## 2019-10-03 ENCOUNTER — Ambulatory Visit (HOSPITAL_COMMUNITY)
Admission: RE | Admit: 2019-10-03 | Discharge: 2019-10-03 | Disposition: A | Discharge status: Home / Self Care | Payer: Medicare Other | Source: Ambulatory Visit | Attending: Interventional Radiology | Admitting: Interventional Radiology

## 2019-10-03 ENCOUNTER — Ambulatory Visit (HOSPITAL_COMMUNITY): Admission: RE | Admit: 2019-10-03 | Payer: Medicare Other | Source: Ambulatory Visit

## 2019-10-03 DIAGNOSIS — I771 Stricture of artery: Secondary | ICD-10-CM

## 2019-10-03 DIAGNOSIS — R42 Dizziness and giddiness: Secondary | ICD-10-CM

## 2019-10-03 DIAGNOSIS — I6503 Occlusion and stenosis of bilateral vertebral arteries: Secondary | ICD-10-CM | POA: Diagnosis not present

## 2019-10-03 NOTE — Progress Notes (Signed)
Chief Complaint: Patient was today to discuss recent MRI results.  Supervising Physician: Luanne Bras  Patient Status: [facility] - Out-pt  History of Present Illness: Scott [name] is a 85 y.o. 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 Briefly, Scott [name] was first seen in consultation by Dr. Estanislado Pandy on 08/01/17 after Scott [name] was found to have micro infarcts in the posterior fossa on MRI/MRA brain - after thorough discussion Scott [name] elected to proceed with diagnostic cerebral angiogram which was performed on 08/21/17. Diagnostic cerebral angiogram noted 80-90% stenosis of the right vertebrobasilar junction, 30% stenosis of the right vertebral artery at its origin, 40% stenosis of the right ICA at the bulb, 50% stenosis of the proximal left subclavian artery and 50% stenosis of the origin of the superior division of the left MCA. Scott [name] was planned for endovascular intervention however his p2y12 was noted to be 268 on Plavix and Scott [name] was transitioned to [place] with the procedure being postponed. Scott [name] underwent a repeat diagnostic cerebral angiogram on 11/01/17 with plans for possible intervention which noted 67% stenosis of the dominant right vertebrobasilar junction just proximal to the right posterior-inferior cerebellar artery and no intervention was performed. Given Scott [name] was asymptomatic at that time Scott [name] elected to continue with medical management (dual platelet therapy) and regular imaging follow ups. Scott [name] underwent CTA head/neck on 09/18/18 and 05/31/19. Most recently Scott [name] underwent MRI/MRA head/brain on 12/16 due to reported worsening dizziness and neck pain, Scott [name] presents today to discuss those results.  Scott [name] presents with his wife today, Scott [name] states that Scott [name] is "unsure if I'm still having those  spells like before" but Scott [name] does not believe his symptoms have worsened, in fact they may be somewhat less frequent. Scott [name] states Scott [name] occasionally gets dizzy eating breakfast and when Scott [name] stands up the dizziness goes away, Scott [name] describes the dizziness as "being in my head." Scott [name] denies any syncope or falls but does have some issues with walking due to chronic back pain for which Scott [name] uses a cane. Scott [name] endorses occasional right sided neck pain with twisting motions, however this does not always happen with the same motion. Scott [name] denies any other neurologic symptoms and has been feeling well overall. Scott [name] continues to Eliquis however Scott [name] often does not take his baby aspirin as prescribed because Scott [name] takes so many pills and it a small dose Scott [name] figured it would not need to be taken everyday. Scott [name] takes his BP medication as prescribed and checks his BP at home sometimes, Scott [name] reports his BP is usually around 130/85 mmHg, sometimes a little higher. Scott [name] and his wife feel that Scott [name] has been about the same since we last saw him in January of this year.  Past Medical History:  Diagnosis Date  . Anxiety 10/31/2014   due to upcoming procedure  . Arrhythmia   . Arthritis   . Cancer ([place])    Melonoma on nose- removed  . Chronic kidney disease   . Dyspnea   . Dysrhythmia    afib  . Enlarged prostate   . GERD (gastroesophageal reflux disease)   . Headache   . Hyperlipidemia   . Hypertension   . Hypothyroidism   . Overactive bladder   . Stroke [facility])    TIA - "nothing taste right anymore, right eye pain  .  TIA (transient ischemic attack)   . Wears glasses     Past Surgical History:  Procedure Laterality Date  . BACK SURGERY  08/2013   x2  . CARDIOVERSION N/A 01/31/2018   Procedure: CARDIOVERSION;  Surgeon: Arnoldo Lenis, MD;  Location: [facility];  Service: Endoscopy;  Laterality: N/A;  . CATARACT EXTRACTION    . COLONOSCOPY    . EYE SURGERY Bilateral    cataract  . IR ANGIO INTRA EXTRACRAN SEL COM CAROTID INNOMINATE  BILAT MOD SED  08/04/2017  . IR ANGIO VERTEBRAL SEL VERTEBRAL BILAT MOD SED  08/04/2017  . IR ANGIO VERTEBRAL SEL VERTEBRAL UNI R MOD SED  11/01/2017  . IR RADIOLOGIST EVAL & MGMT  07/28/2017  . IR RADIOLOGIST EVAL & MGMT  08/22/2017  . IR RADIOLOGIST EVAL & MGMT  11/29/2017  . JOINT REPLACEMENT Left 10/2015   hip  . KNEE ARTHROPLASTY Left 1999  . KNEE ARTHROPLASTY Right 2008  . LUMBAR FUSION  06/2014  . RADIOLOGY WITH ANESTHESIA N/A 11/01/2017   Procedure: STENTING;  Surgeon: Luanne Bras, MD;  Location: [facility];  Service: Radiology;  Laterality: N/A;  . TRANSURETHRAL RESECTION OF PROSTATE  03/2015  . TRIGGER FINGER RELEASE Right    3rd and 4th    Allergies: Tramadol  Medications: Prior to Admission medications   Medication Sig Start Date End Date Taking? Authorizing Provider  acetaminophen (TYLENOL) 500 MG tablet Take 1,000 mg by mouth 2 (two) times daily.     [provider]  apixaban (ELIQUIS) 5 MG TABS tablet Take 1 tablet (5 mg total) by mouth 2 (two) times daily. 08/31/18   Herminio Commons, MD  aspirin EC 81 MG tablet Take 81 mg by mouth at bedtime.     [provider]  atorvastatin (LIPITOR) 10 MG tablet Take 10 mg by mouth every evening.     [provider]  Carboxymethylcellul-Glycerin (LUBRICATING EYE DROPS OP) Place 1-2 drops into both eyes 2 (two) times daily as needed (for dry eyes).     [provider]  diltiazem (CARDIZEM) 60 MG tablet Take 1 tablet (60 mg total) by mouth 2 (two) times daily. (May take an extra 30mg  in the evening as needed)  Med increased 10/26/2017.   PLACE ON FILE 10/26/17   Herminio Commons, MD  doxazosin (CARDURA) 8 MG tablet Take 8 mg by mouth at bedtime.    [provider]  finasteride (PROSCAR) 5 MG tablet Take 5 mg by mouth daily. 04/15/19   [provider]  fluticasone (FLONASE) 50 MCG/ACT nasal spray Place 2 sprays into both nostrils daily as needed for allergies or rhinitis.      [provider]  labetalol (NORMODYNE) 200 MG tablet Take 200 mg by mouth 2 (two) times daily.    [provider]  levothyroxine (SYNTHROID, LEVOTHROID) 150 MCG tablet Take 150 mcg daily before breakfast by mouth.    [provider]  lisinopril (PRINIVIL,ZESTRIL) 40 MG tablet Take 40 mg by mouth daily.    [provider]  omeprazole (PRILOSEC) 20 MG capsule Take 20 mg by mouth daily.    [provider]  oxyCODONE-acetaminophen (PERCOCET) 10-325 MG tablet Take 1 tablet by mouth 2 (two) times daily as needed. 05/07/19   [provider]  trolamine salicylate (ASPERCREME) 10 % cream Apply 1 application topically as needed for muscle pain.    [provider]     Family History  Problem Relation Age of Onset  .  Coronary artery disease Mother     Social History   Socioeconomic History  . Marital status: Married    Spouse name: Not on file  . Number of children: Not on file  . Years of education: Not on file  . Highest education level: Not on file  Occupational History  . Not on file  Tobacco Use  . Smoking status: Former Smoker    Years: 40.00    Types: Cigarettes    Start date: 10/17/1965    Quit date: 10/17/1978    Years since quitting: 40.9  . Smokeless tobacco: Never Used  Substance and Sexual Activity  . Alcohol use: No    Alcohol/week: 0.0 standard drinks  . Drug use: No  . Sexual activity: Yes    Birth control/protection: None  Other Topics Concern  . Not on file  Social History Narrative  . Not on file   Social Determinants of Health   Financial Resource Strain:   . Difficulty of Paying Living Expenses: Not on file  Food Insecurity:   . Worried About Charity fundraiser in the Last Year: Not on file  . Ran Out of Food in the Last Year: Not on file  Transportation Needs:   . Lack of Transportation (Medical): Not on file  . Lack of Transportation (Non-Medical): Not on file  Physical Activity:   . Days of  Exercise per Week: Not on file  . Minutes of Exercise per Session: Not on file  Stress:   . Feeling of Stress : Not on file  Social Connections:   . Frequency of Communication with Friends and Family: Not on file  . Frequency of Social Gatherings with Friends and Family: Not on file  . Attends Religious Services: Not on file  . Active Member of Clubs or Organizations: Not on file  . Attends Archivist Meetings: Not on file  . Marital Status: Not on file     Review of Systems: A 12 point ROS discussed and pertinent positives are indicated in the HPI above.  All other systems are negative.  Review of Systems  Constitutional: Negative for appetite change, chills, fatigue and fever.  HENT: Negative for nosebleeds, tinnitus and trouble swallowing.   Eyes: Negative for photophobia, pain, redness and visual disturbance.  Respiratory: Negative for cough and shortness of breath.   Cardiovascular: Negative for chest pain.  Gastrointestinal: Negative for abdominal pain, blood in stool, diarrhea, nausea and vomiting.  Genitourinary: Negative for hematuria.  Musculoskeletal: Positive for arthralgias, back pain (chronic; uses can for ambulation), gait problem (2/2 back pain) and neck pain (sometimes with certain motions). Negative for neck stiffness.  Skin: Negative for rash and wound.  Neurological: Positive for dizziness (occasionally; usually happens when eating breakfast and resolved with standing) and headaches (infrequent, mild). Negative for tremors, seizures, syncope, facial asymmetry, speech difficulty, weakness, light-headedness and numbness.  Hematological: Does not bruise/bleed easily.    Vital Signs: There were no vitals taken for this visit.  Physical Exam Constitutional:      General: Scott [name] is not in acute distress.    Comments: Pleasant, good historian, wife present during exam  HENT:     Head: Normocephalic.  Pulmonary:     Effort: Pulmonary effort is normal.   Skin:    General: Skin is warm and dry.  Neurological:     Mental Status: Scott [name] is alert and oriented to person, place, and time.     Motor: No weakness.  Gait: Gait abnormal (slowed; walks with cane).  Psychiatric:        Mood and Affect: Mood normal.        Behavior: Behavior normal.        Thought Content: Thought content normal.        Judgment: Judgment normal.      Imaging: MR ANGIO HEAD WO CONTRAST  Result Date: 10/02/2019 CLINICAL DATA:  Dizziness. Left arm weakness. Left leg weakness. Vertebral artery stenosis. EXAM: MRI HEAD WITHOUT CONTRAST MRA HEAD WITHOUT CONTRAST TECHNIQUE: Multiplanar, multiecho pulse sequences of the brain and surrounding structures were obtained without intravenous contrast. Angiographic images of the head were obtained using MRA technique without contrast. COMPARISON:  CTA of the head and neck 05/30/2019 FINDINGS: MRI HEAD FINDINGS Brain: Remote lacunar infarcts are again noted within the left greater than right cerebellum. Remote lacunar infarcts are also present in the thalami, left greater than right. Moderate confluent periventricular T2 changes are present. Diffuse subcortical white matter hyperintensities are present as well. Dilated perivascular spaces are present throughout the basal ganglia. The brainstem is normal. Internal auditory canals are within normal limits. Vascular: Flow is present in the major intracranial arteries. Skull and upper cervical spine: The craniocervical junction is normal. Upper cervical spine is within normal limits. Marrow signal is unremarkable. Sinuses/Orbits: Chronic right sphenoid sinus disease is again noted. The paranasal sinuses and mastoid air cells are otherwise clear. Bilateral lens replacements are noted. Globes and orbits are otherwise unremarkable. MRA HEAD FINDINGS The internal carotid arteries are within normal limits from the high cervical segments through the ICA termini bilaterally. The A1 and M1 segments are  normal. MCA bifurcations are intact. Signal loss in the proximal inferior M2 divisions is felt to be artifactual. ACA and MCA branch vessels are within normal limits. High-grade stenosis of the V4 segment of the dominant right vertebral artery is again noted. There is signal loss in the distal left vertebral artery just proximal to the vertebrobasilar junction suggesting additional high-grade stenosis. Signal loss in the proximal right P2 segment is felt to be artifactual. A left posterior communicating artery is noted. Vessel irregularity of the PCAs bilaterally is felt to be artifactual. IMPRESSION: 1. No acute intracranial abnormality or significant interval change. 2. Remote lacunar infarcts of the cerebellum bilaterally, left greater than right. 3. Remote lacunar infarcts of the thalami, left greater than right. 4. High-grade stenosis of the V4 segment of the dominant right vertebral artery. 5. High-grade stenosis of the distal left vertebral artery just proximal to the vertebrobasilar junction. 6. Probable artifactual signal loss in the inferior MCA branches in PCA branches bilaterally. CTA of the head would be more sensitive and specific for the evaluation of intracranial disease in this patient. 7. Moderate diffuse subcortical white matter disease bilaterally likely reflects the sequela of chronic microvascular ischemia. Electronically Signed   By: San Morelle M.D.   On: 10/02/2019 15:47   MR BRAIN WO CONTRAST  Result Date: 10/02/2019 CLINICAL DATA:  Dizziness. Left arm weakness. Left leg weakness. Vertebral artery stenosis. EXAM: MRI HEAD WITHOUT CONTRAST MRA HEAD WITHOUT CONTRAST TECHNIQUE: Multiplanar, multiecho pulse sequences of the brain and surrounding structures were obtained without intravenous contrast. Angiographic images of the head were obtained using MRA technique without contrast. COMPARISON:  CTA of the head and neck 05/30/2019 FINDINGS: MRI HEAD FINDINGS Brain: Remote lacunar  infarcts are again noted within the left greater than right cerebellum. Remote lacunar infarcts are also present in the thalami, left greater than right.  Moderate confluent periventricular T2 changes are present. Diffuse subcortical white matter hyperintensities are present as well. Dilated perivascular spaces are present throughout the basal ganglia. The brainstem is normal. Internal auditory canals are within normal limits. Vascular: Flow is present in the major intracranial arteries. Skull and upper cervical spine: The craniocervical junction is normal. Upper cervical spine is within normal limits. Marrow signal is unremarkable. Sinuses/Orbits: Chronic right sphenoid sinus disease is again noted. The paranasal sinuses and mastoid air cells are otherwise clear. Bilateral lens replacements are noted. Globes and orbits are otherwise unremarkable. MRA HEAD FINDINGS The internal carotid arteries are within normal limits from the high cervical segments through the ICA termini bilaterally. The A1 and M1 segments are normal. MCA bifurcations are intact. Signal loss in the proximal inferior M2 divisions is felt to be artifactual. ACA and MCA branch vessels are within normal limits. High-grade stenosis of the V4 segment of the dominant right vertebral artery is again noted. There is signal loss in the distal left vertebral artery just proximal to the vertebrobasilar junction suggesting additional high-grade stenosis. Signal loss in the proximal right P2 segment is felt to be artifactual. A left posterior communicating artery is noted. Vessel irregularity of the PCAs bilaterally is felt to be artifactual. IMPRESSION: 1. No acute intracranial abnormality or significant interval change. 2. Remote lacunar infarcts of the cerebellum bilaterally, left greater than right. 3. Remote lacunar infarcts of the thalami, left greater than right. 4. High-grade stenosis of the V4 segment of the dominant right vertebral artery. 5.  High-grade stenosis of the distal left vertebral artery just proximal to the vertebrobasilar junction. 6. Probable artifactual signal loss in the inferior MCA branches in PCA branches bilaterally. CTA of the head would be more sensitive and specific for the evaluation of intracranial disease in this patient. 7. Moderate diffuse subcortical white matter disease bilaterally likely reflects the sequela of chronic microvascular ischemia. Electronically Signed   By: San Morelle M.D.   On: 10/02/2019 15:47    Labs:  CBC: No results for input(s): WBC, HGB, HCT, PLT in the last 8760 hours.  COAGS: No results for input(s): INR, APTT in the last 8760 hours.  BMP: Recent Labs    05/30/19 1528  CREATININE 1.40*    LIVER FUNCTION TESTS: No results for input(s): BILITOT, AST, ALT, ALKPHOS, PROT, ALBUMIN in the last 8760 hours.  TUMOR MARKERS: No results for input(s): AFPTM, CEA, CA199, CHROMGRNA in the last 8760 hours.  Assessment and Plan:  85 y/o M followed by NIR since 2018 due to known stenosis of the dominant right vertebral artery. Scott [name] has undergone diagnostic cerebral angiogram x2 without intervention as Scott [name] has previously been essentially asymptomatic on current therapy of Eliquis + ASA. Scott [name] has undergone several routine imaging exams without significant change in the known areas of stenosis. Scott [name] reported earlier this month that Scott [name] was experiencing worsening dizziness and neck pain and underwent MRI/MRA head/neck yesterday to further evaluate these symptoms. Results of MRI/MRA again showed no significant changes from previous imaging exams. Scott [name] and his wife present today to discuss his symptoms and recent imaging.  Dr. Estanislado Pandy was present for consultation. Discussed that areas of known stenosis appear essentially unchanged on MRI/MRA and there are no new areas of concern. When asked today about his previously reported worsening dizziness and neck pain Scott [name] is unsure that these symptoms have  worsened and both Scott [name] and his wife actually think the symptoms occur less frequently than previously. Scott [name] has been compliant with  his anti-hypertensives and Eliquis however Scott [name] admits to poor compliance with ASA 81 mg QD, stating Scott [name] takes it infrequently.   Plan for follow-up:  1. Continue Eliquis 5 mg BID 2. Start ASA 81 mg QD - continue to take everyday along with Eliquis. Monitor for s/s of abnormal bleeding. 3. MRI/MRA head/neck in 6 months.  4. Continue all prescribed medications and maintain BP control. 5. Maintain all follow ups with other providers. 6. Call NIR or present to ED with worsening symptoms.  Informed patient that our schedulers will call him to schedule his MRI/MRA head/neck.  Return precautions reviewed with patient and wife who state understanding.   All questions answered and concerns addressed. Patient conveys understanding and agrees with plan.  Thank you for this interesting consult.  I greatly enjoyed meeting Scott [name] and look forward to participating in their care.  A copy of this report was sent to the requesting provider on this date.  Electronically Signed: Joaquim Nam, PA-C 10/03/2019, 1:20 PM   I spent a total of  40 Minutes in face to face in clinical consultation, greater than 50% of which was counseling/coordinating care for MRI/MRA follow up.

## 2019-11-19 DIAGNOSIS — N182 Chronic kidney disease, stage 2 (mild): Secondary | ICD-10-CM | POA: Diagnosis not present

## 2019-11-19 DIAGNOSIS — I4819 Other persistent atrial fibrillation: Secondary | ICD-10-CM | POA: Diagnosis not present

## 2019-11-19 DIAGNOSIS — I10 Essential (primary) hypertension: Secondary | ICD-10-CM | POA: Diagnosis not present

## 2019-11-19 DIAGNOSIS — C61 Malignant neoplasm of prostate: Secondary | ICD-10-CM | POA: Diagnosis not present

## 2019-11-19 DIAGNOSIS — Z13220 Encounter for screening for lipoid disorders: Secondary | ICD-10-CM | POA: Diagnosis not present

## 2019-11-19 DIAGNOSIS — E559 Vitamin D deficiency, unspecified: Secondary | ICD-10-CM | POA: Diagnosis not present

## 2019-11-19 DIAGNOSIS — I700 Atherosclerosis of aorta: Secondary | ICD-10-CM | POA: Diagnosis not present

## 2019-11-26 ENCOUNTER — Telehealth: Payer: Self-pay | Admitting: *Deleted

## 2019-11-26 DIAGNOSIS — N401 Enlarged prostate with lower urinary tract symptoms: Secondary | ICD-10-CM | POA: Diagnosis not present

## 2019-11-26 DIAGNOSIS — N182 Chronic kidney disease, stage 2 (mild): Secondary | ICD-10-CM | POA: Diagnosis not present

## 2019-11-26 DIAGNOSIS — I10 Essential (primary) hypertension: Secondary | ICD-10-CM | POA: Diagnosis not present

## 2019-11-26 DIAGNOSIS — Z6826 Body mass index (BMI) 26.0-26.9, adult: Secondary | ICD-10-CM | POA: Diagnosis not present

## 2019-11-26 DIAGNOSIS — I480 Paroxysmal atrial fibrillation: Secondary | ICD-10-CM | POA: Diagnosis not present

## 2019-11-26 DIAGNOSIS — Z0001 Encounter for general adult medical examination with abnormal findings: Secondary | ICD-10-CM | POA: Diagnosis not present

## 2019-11-26 DIAGNOSIS — I700 Atherosclerosis of aorta: Secondary | ICD-10-CM | POA: Diagnosis not present

## 2019-11-26 DIAGNOSIS — I6521 Occlusion and stenosis of right carotid artery: Secondary | ICD-10-CM | POA: Diagnosis not present

## 2019-11-26 MED ORDER — APIXABAN 2.5 MG PO TABS: 2.5000 mg | ORAL_TABLET | Freq: Two times a day (BID) | ORAL | 6 refills | Status: DC

## 2019-11-26 NOTE — Telephone Encounter (Signed)
Received call from Dr Burdine's office.  Pt is on Eliquis 5mg  twice daily.  Wants to know if Eliquis dose should be decreased to 2.5mg  twice daily based on age, SCr, Wt.  Age 86   SCr <1.5  Wt. 81.2kg   Pt does meet criteria to decrease dose to 2.5mg  BID.  New Rx sent to Pharmacy and pt was notified by Dr Iverson Alamin office.

## 2020-01-03 DIAGNOSIS — Z6826 Body mass index (BMI) 26.0-26.9, adult: Secondary | ICD-10-CM | POA: Diagnosis not present

## 2020-01-03 DIAGNOSIS — M48061 Spinal stenosis, lumbar region without neurogenic claudication: Secondary | ICD-10-CM | POA: Diagnosis not present

## 2020-01-03 DIAGNOSIS — M545 Low back pain: Secondary | ICD-10-CM | POA: Diagnosis not present

## 2020-01-24 ENCOUNTER — Other Ambulatory Visit: Payer: Self-pay

## 2020-01-24 ENCOUNTER — Encounter: Payer: Self-pay | Admitting: Cardiovascular Disease

## 2020-01-24 ENCOUNTER — Ambulatory Visit (INDEPENDENT_AMBULATORY_CARE_PROVIDER_SITE_OTHER): Payer: Medicare Other | Admitting: Cardiovascular Disease

## 2020-01-24 VITALS — BP 112/68 | HR 69 | Ht 66.0 in | Wt 169.0 lb

## 2020-01-24 DIAGNOSIS — I4819 Other persistent atrial fibrillation: Secondary | ICD-10-CM | POA: Diagnosis not present

## 2020-01-24 DIAGNOSIS — I651 Occlusion and stenosis of basilar artery: Secondary | ICD-10-CM | POA: Diagnosis not present

## 2020-01-24 DIAGNOSIS — E782 Mixed hyperlipidemia: Secondary | ICD-10-CM | POA: Diagnosis not present

## 2020-01-24 DIAGNOSIS — Z7901 Long term (current) use of anticoagulants: Secondary | ICD-10-CM | POA: Diagnosis not present

## 2020-01-24 DIAGNOSIS — I4821 Permanent atrial fibrillation: Secondary | ICD-10-CM

## 2020-01-24 DIAGNOSIS — I6509 Occlusion and stenosis of unspecified vertebral artery: Secondary | ICD-10-CM

## 2020-01-24 DIAGNOSIS — I10 Essential (primary) hypertension: Secondary | ICD-10-CM

## 2020-01-24 NOTE — Patient Instructions (Signed)
Medication Instructions:  Continue all current medications.   Labwork: none  Testing/Procedures: none  Follow-Up: 6 months   Any Other Special Instructions Will Be Listed Below (If Applicable).   If you need a refill on your cardiac medications before your next appointment, please call your pharmacy.  

## 2020-01-24 NOTE — Progress Notes (Signed)
SUBJECTIVE: Scott [name] is a 86 y.o. male with persistent atrial fibrillation. He underwent direct-current cardioversion in 2019 and subsequently went back into atrial fibrillation. At that time, I discussion was held with him about initiating antiarrhythmic therapy and the possibility of repeat direct-current cardioversion but the patient preferred a rate control strategy.  He is here with his wife.  He denies chest pain and shortness of breath.  He seldom has palpitations.  Primary complaints relate to lower back pain.  He denies bleeding problems.  I personally reviewed the ECG performed today which demonstrates rate controlled atrial fibrillation.    Review of Systems: As per "subjective", otherwise negative.  Allergies  Allergen Reactions  . Tramadol Nausea Only    Current Outpatient Medications  Medication Sig Dispense Refill  . acetaminophen (TYLENOL) 500 MG tablet Take 1,000 mg by mouth 2 (two) times daily.     Marland Kitchen apixaban (ELIQUIS) 2.5 MG TABS tablet Take 1 tablet (2.5 mg total) by mouth 2 (two) times daily. 60 tablet 6  . aspirin EC 81 MG tablet Take 81 mg by mouth at bedtime.     Marland Kitchen atorvastatin (LIPITOR) 10 MG tablet Take 10 mg by mouth every evening.     . Carboxymethylcellul-Glycerin (LUBRICATING EYE DROPS OP) Place 1-2 drops into both eyes 2 (two) times daily as needed (for dry eyes).     Marland Kitchen diltiazem (CARDIZEM) 60 MG tablet Take 1 tablet (60 mg total) by mouth 2 (two) times daily. (May take an extra 30mg  in the evening as needed)  Med increased 10/26/2017.   PLACE ON FILE 75 tablet 6  . doxazosin (CARDURA) 8 MG tablet Take 8 mg by mouth at bedtime.    . finasteride (PROSCAR) 5 MG tablet Take 5 mg by mouth daily.    . fluticasone (FLONASE) 50 MCG/ACT nasal spray Place 2 sprays into both nostrils daily as needed for allergies or rhinitis.     Marland Kitchen labetalol (NORMODYNE) 200 MG tablet Take 200 mg by mouth 2 (two) times daily.    Marland Kitchen levothyroxine (SYNTHROID,  LEVOTHROID) 150 MCG tablet Take 150 mcg daily before breakfast by mouth.    Marland Kitchen lisinopril (PRINIVIL,ZESTRIL) 40 MG tablet Take 40 mg by mouth daily.    Marland Kitchen omeprazole (PRILOSEC) 20 MG capsule Take 20 mg by mouth daily.    Marland Kitchen oxyCODONE-acetaminophen (PERCOCET) 10-325 MG tablet Take 1 tablet by mouth 2 (two) times daily as needed.    . trolamine salicylate (ASPERCREME) 10 % cream Apply 1 application topically as needed for muscle pain.     No current facility-administered medications for this visit.   Facility-Administered Medications Ordered in Other Visits  Medication Dose Route Frequency Provider Last Rate Last Admin  . clopidogrel (PLAVIX) tablet 75 mg  75 mg Oral 60 min Pre-Op Monia Sabal, PA-C        Past Medical History:  Diagnosis Date  . Anxiety 10/31/2014   due to upcoming procedure  . Arrhythmia   . Arthritis   . Cancer ([place])    Melonoma on nose- removed  . Chronic kidney disease   . Dyspnea   . Dysrhythmia    afib  . Enlarged prostate   . GERD (gastroesophageal reflux disease)   . Headache   . Hyperlipidemia   . Hypertension   . Hypothyroidism   . Overactive bladder   . Stroke [facility])    TIA - "nothing taste right anymore, right eye pain  . TIA (transient ischemic attack)   .  Wears glasses     Past Surgical History:  Procedure Laterality Date  . BACK SURGERY  08/2013   x2  . CARDIOVERSION N/A 01/31/2018   Procedure: CARDIOVERSION;  Surgeon: Arnoldo Lenis, MD;  [facility];  Service: Endoscopy;  Laterality: N/A;  . CATARACT EXTRACTION    . COLONOSCOPY    . EYE SURGERY Bilateral    cataract  . IR ANGIO INTRA EXTRACRAN SEL COM CAROTID INNOMINATE BILAT MOD SED  08/04/2017  . IR ANGIO VERTEBRAL SEL VERTEBRAL BILAT MOD SED  08/04/2017  . IR ANGIO VERTEBRAL SEL VERTEBRAL UNI R MOD SED  11/01/2017  . IR RADIOLOGIST EVAL & MGMT  07/28/2017  . IR RADIOLOGIST EVAL & MGMT  08/22/2017  . IR RADIOLOGIST EVAL & MGMT  11/29/2017  . JOINT REPLACEMENT Left  10/2015   hip  . KNEE ARTHROPLASTY Left 1999  . KNEE ARTHROPLASTY Right 2008  . LUMBAR FUSION  06/2014  . RADIOLOGY WITH ANESTHESIA N/A 11/01/2017   Procedure: STENTING;  Surgeon: Luanne Bras, MD;  Location: [place];  Service: Radiology;  Laterality: N/A;  . TRANSURETHRAL RESECTION OF PROSTATE  03/2015  . TRIGGER FINGER RELEASE Right    3rd and 4th    Social History   Socioeconomic History  . Marital status: Married    Spouse name: Not on file  . Number of children: Not on file  . Years of education: Not on file  . Highest education level: Not on file  Occupational History  . Not on file  Tobacco Use  . Smoking status: Former Smoker    Years: 40.00    Types: Cigarettes    Start date: 10/17/1965    Quit date: 10/17/1978    Years since quitting: 41.2  . Smokeless tobacco: Never Used  Substance and Sexual Activity  . Alcohol use: No    Alcohol/week: 0.0 standard drinks  . Drug use: No  . Sexual activity: Yes    Birth control/protection: None  Other Topics Concern  . Not on file  Social History Narrative  . Not on file   Social Determinants of Health   Financial Resource Strain:   . Difficulty of Paying Living Expenses:   Food Insecurity:   . Worried About Charity fundraiser in the Last Year:   . Arboriculturist in the Last Year:   Transportation Needs:   . Film/video editor (Medical):   Marland Kitchen Lack of Transportation (Non-Medical):   Physical Activity:   . Days of Exercise per Week:   . Minutes of Exercise per Session:   Stress:   . Feeling of Stress :   Social Connections:   . Frequency of Communication with Friends and Family:   . Frequency of Social Gatherings with Friends and Family:   . Attends Religious Services:   . Active Member of Clubs or Organizations:   . Attends Archivist Meetings:   Marland Kitchen Marital Status:   Intimate Partner Violence:   . Fear of Current or Ex-Partner:   . Emotionally Abused:   Marland Kitchen Physically Abused:   . Sexually  Abused:       Vitals:   01/24/20 1100  BP: 112/68  Pulse: 69  SpO2: 98%  Weight: 169 lb (76.7 kg)  Height: 5\' 6"  (1.676 m)    Wt Readings from Last 3 Encounters:  01/24/20 169 lb (76.7 kg)  05/15/19 179 lb (81.2 kg)  11/05/18 186 lb (84.4 kg)     PHYSICAL EXAM  General: NAD HEENT: Normal. Neck: No JVD, no thyromegaly. Lungs: Clear to auscultation bilaterally with normal respiratory effort. CV: Regular rate and irregular rhythm, normal S1/S2, no S3, no murmur. No pretibial or periankle edema.  No carotid bruit.   Abdomen: Soft, nontender, no distention.  Neurologic: Alert and oriented.  Psych: Normal affect. Skin: Normal. Musculoskeletal: No gross deformities.      Labs: Lab Results  Component Value Date/Time   K 4.4 01/25/2018 01:50 PM   BUN 27 (H) 01/25/2018 01:50 PM   CREATININE 1.40 (H) 05/30/2019 03:28 PM   ALT 14 (L) 11/01/2017 06:22 AM   HGB 11.9 (L) 01/25/2018 01:50 PM     Lipids: No results found for: LDLCALC, LDLDIRECT, CHOL, TRIG, HDL     ASSESSMENT AND PLAN:  1. Permanent atrial fibrillation: Symptomatically stable.He underwent direct-current cardioversion on 01/31/2018. He subsequently went back into atrial fibrillation. A discussion was previously held with him about initiating antiarrhythmic therapy and the possibility of repeat direct-current cardioversion but the patient preferred a rate control strategy.He is on diltiazem, labetalol, and apixaban.  2. Hypertension: Blood pressure is normal. No change to therapy.  3. Hyperlipidemia: Remains on atorvastatin 10 mg daily. No changes.  4. Vertebrobasilar artery stenosis: He has known right-sided stenosis and this is followed by Dr. Estanislado Pandy. He is on aspirin 81 mg.   Disposition: Follow up 6 months   Kate Sable, M.D., F.A.C.C.

## 2020-02-14 DIAGNOSIS — N182 Chronic kidney disease, stage 2 (mild): Secondary | ICD-10-CM | POA: Diagnosis not present

## 2020-02-14 DIAGNOSIS — I10 Essential (primary) hypertension: Secondary | ICD-10-CM | POA: Diagnosis not present

## 2020-04-01 ENCOUNTER — Telehealth (HOSPITAL_COMMUNITY): Payer: Self-pay

## 2020-04-01 NOTE — Telephone Encounter (Signed)
Called to schedule mri/mra, no answer, left vm. AW  

## 2020-04-02 ENCOUNTER — Telehealth (HOSPITAL_COMMUNITY): Payer: Self-pay

## 2020-04-02 NOTE — Telephone Encounter (Signed)
Pt called to give me updated insurance info. Will send an updated request and call back to schedule. AW

## 2020-04-07 ENCOUNTER — Other Ambulatory Visit (HOSPITAL_COMMUNITY): Payer: Self-pay | Admitting: Interventional Radiology

## 2020-04-07 ENCOUNTER — Telehealth (HOSPITAL_COMMUNITY): Payer: Self-pay

## 2020-04-07 DIAGNOSIS — I771 Stricture of artery: Secondary | ICD-10-CM

## 2020-05-04 ENCOUNTER — Ambulatory Visit (HOSPITAL_COMMUNITY)
Admission: RE | Admit: 2020-05-04 | Discharge: 2020-05-04 | Disposition: A | Discharge status: Home / Self Care | Payer: Medicare Other | Source: Ambulatory Visit | Attending: Interventional Radiology | Admitting: Interventional Radiology

## 2020-05-04 ENCOUNTER — Other Ambulatory Visit: Payer: Self-pay

## 2020-05-04 DIAGNOSIS — I771 Stricture of artery: Secondary | ICD-10-CM

## 2020-05-04 DIAGNOSIS — I672 Cerebral atherosclerosis: Secondary | ICD-10-CM | POA: Insufficient documentation

## 2020-05-04 DIAGNOSIS — I6501 Occlusion and stenosis of right vertebral artery: Secondary | ICD-10-CM | POA: Insufficient documentation

## 2020-05-04 IMAGING — MR MR HEAD W/O CM
12 of 14 series · 34 of 48 positions shown · non-contrast
Comparison: [DATE]

CLINICAL DATA: Follow-up vertebral stenosis.

EXAM:
MRI HEAD WITHOUT CONTRAST
MRA HEAD WITHOUT CONTRAST
TECHNIQUE: Multiplanar, multiecho pulse sequences of the brain and surrounding
structures were obtained without intravenous contrast. Angiographic
images of the head were obtained using MRA technique without
contrast.

[Series 5: DWI · axial · 3.0mm · 0.88mm/px · z∈[-66,+81]mm · 6 of 100 slices shown (1 of 4)]
[im 1/100]
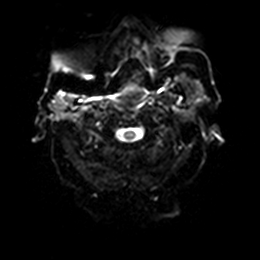
[im 20/100]
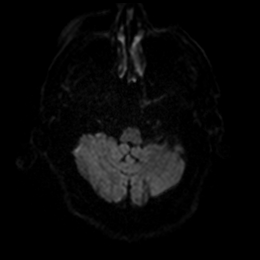
[im 40/100]
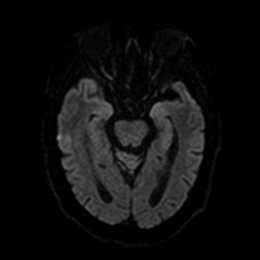
[im 60/100]
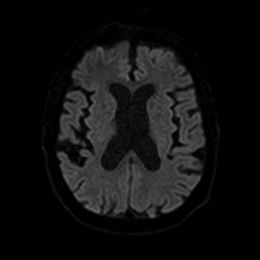
[im 80/100]
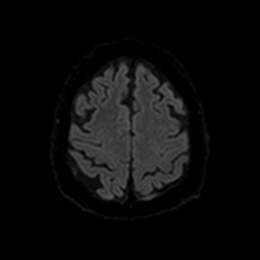
[im 100/100]
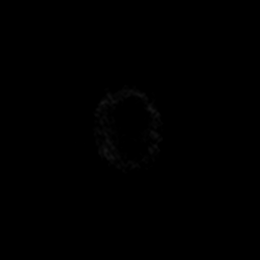

[Series 6: DWI · axial · 3.0mm · 0.88mm/px · z∈[-66,+81]mm · 3 of 50 slices shown (2 of 4)]
[im 1/50]
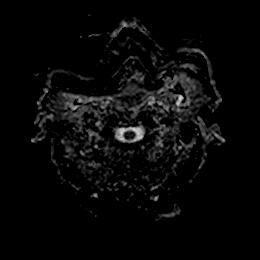
[im 25/50]
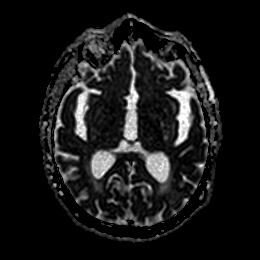
[im 50/50]
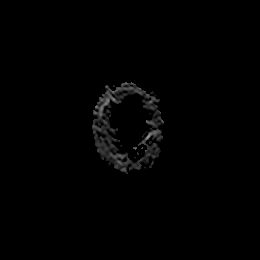

[Series 7: DWI · coronal · 4.0mm · 0.88mm/px · 4 of 64 slices shown (3 of 4)]
[im 1/64]
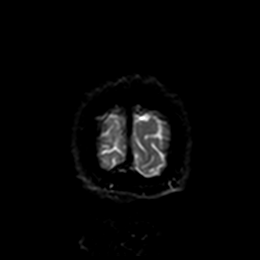
[im 22/64]
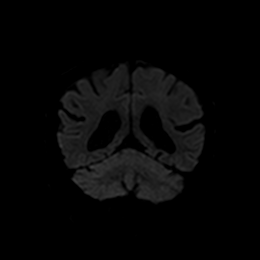
[im 43/64]
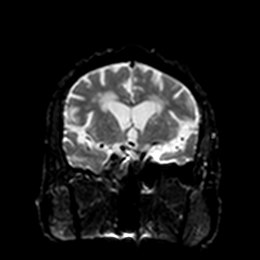
[im 64/64]
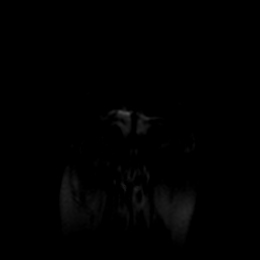

[Series 8: DWI · coronal · 4.0mm · 0.88mm/px · 2 of 32 slices shown (4 of 4)]
[im 1/32]
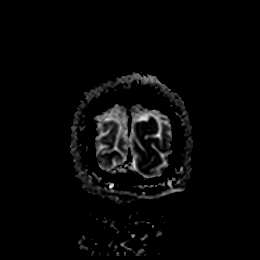
[im 32/32]
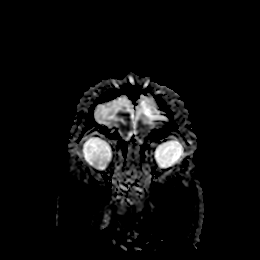

[Series 13: T1 · sagittal · 5.0mm · 0.75mm/px · 1 of 23 slices shown]
[im 1/23]
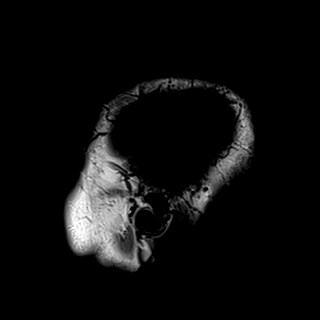

[Series 14: T2 · axial · 5.0mm · 0.72mm/px · 1 of 25 slices shown (1 of 2)]
[im 1/25]
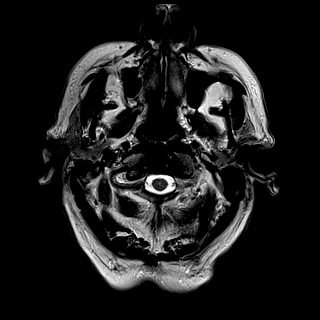

[Series 15: FLAIR · axial · 5.0mm · 0.45mm/px · 1 of 25 slices shown]
[im 1/25]
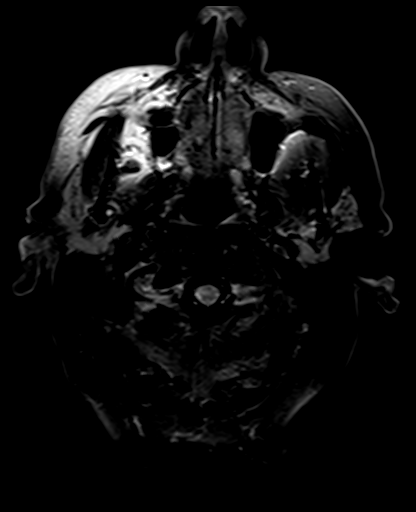

[Series 16: mag_images · axial · 3.0mm · 0.90mm/px · z∈[-61,+113]mm · 4 of 60 slices shown]
[im 1/60]
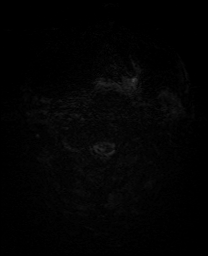
[im 20/60]
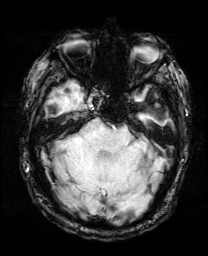
[im 40/60]
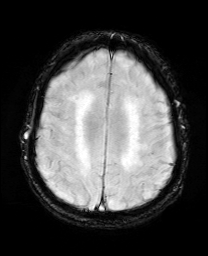
[im 60/60]
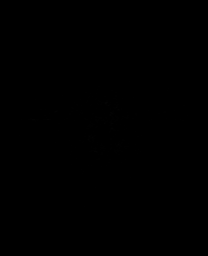

[Series 17: pha_images · axial · 3.0mm · 0.90mm/px · z∈[-59,+95]mm · 3 of 53 slices shown]
[im 1/53]
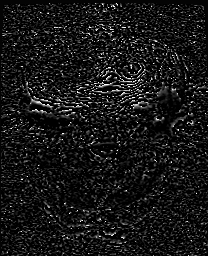
[im 27/53]
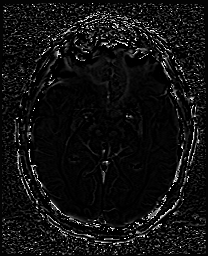
[im 53/53]
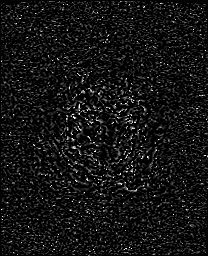

[Series 18: swi_images · axial · 3.0mm · 0.90mm/px · z∈[-61,+113]mm · 4 of 60 slices shown]
[im 1/60]
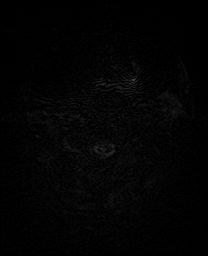
[im 20/60]
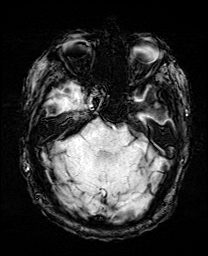
[im 40/60]
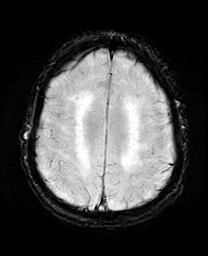
[im 60/60]
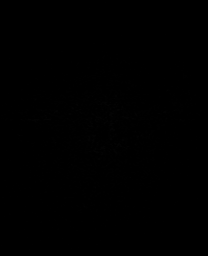

[Series 19: mip_images(sw) · axial · 24.0mm · 0.90mm/px · z∈[-51,+103]mm · 3 of 53 slices shown]
[im 1/53]
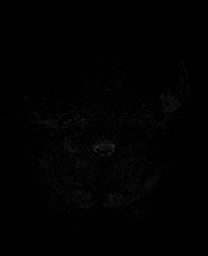
[im 27/53]
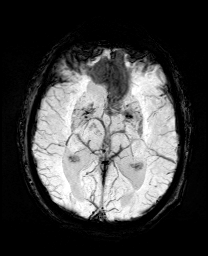
[im 53/53]
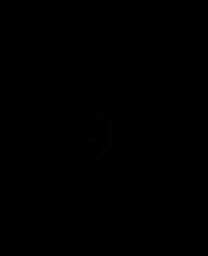

[Series 21: T2 · coronal · 5.0mm · 0.34mm/px · 2 of 29 slices shown (2 of 2)]
[im 1/29]
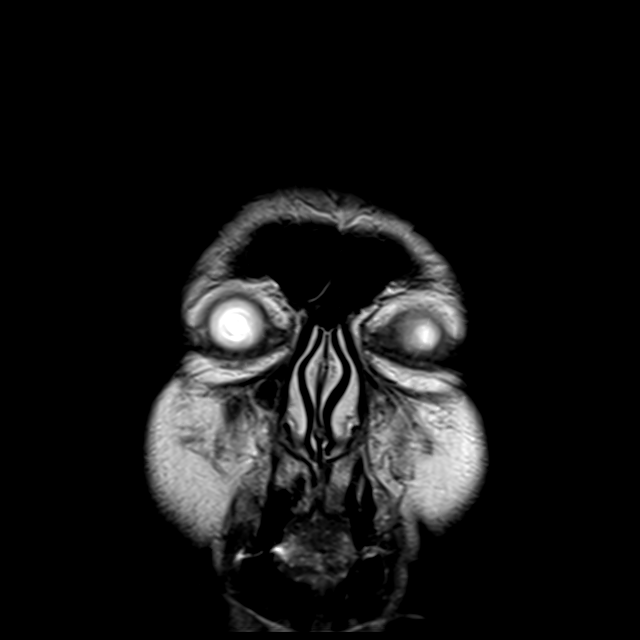
[im 29/29]
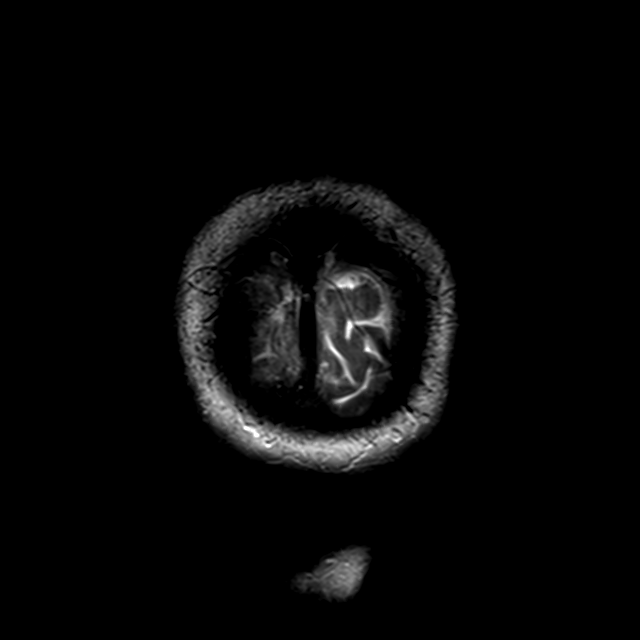

[34 of 48 positions shown; findings below may reference images not displayed]

FINDINGS: MRI HEAD FINDINGS

Brain: Diffusion imaging does not show any acute or subacute
infarction. No brainstem abnormality. Numerous old small vessel
cerebellar infarctions. Old left thalamic infarctions. Chronic
small-vessel ischemic changes throughout the cerebral hemispheric
deep and subcortical white matter. No intra-axial mass lesion,
hemorrhage, hydrocephalus or extra-axial collection. Ventricular
size is in proportion to the degree of brain atrophy.

Vascular: Major vessels at the base of the brain show flow.

Skull and upper cervical spine: Negative

Sinuses/Orbits: Inflammatory changes of the right division of the
sphenoid sinus. Other sinuses clear. Orbits negative. Previous lens
implants.

Other: None

MRA HEAD FINDINGS

Both internal carotid arteries are widely patent through the skull
base and siphon regions. The anterior and middle cerebral vessels
are patent without proximal stenosis, aneurysm or vascular
malformation. No large or medium vessel occlusion identified in the
anterior circulation.

Right vertebral artery V4 segment stenosis. Minimal luminal diameter
1.5 mm. Immediate proximal diameter 3.8 mm, consistent with 60%
stenosis. This does not appear progressive since [REDACTED]. The left
vertebral artery is a small vessel which shows atherosclerotic
irregularity in its distal 1 cm, with stenosis just proximal to the
basilar estimated at 50%. No basilar stenosis. Posterior circulation
branch vessels show flow. Primary anterior circulation supply to the
left posterior cerebral artery.
IMPRESSION: No acute brain finding. Old infarctions of the cerebellum, thalamus
and hemispheric white matter.

Stable intracranial MR angiography. 60% stenosis of the right
vertebral artery V4 segment. Atherosclerotic disease of the distal
left V4 segment with estimated 50% stenosis just proximal to the
basilar.

## 2020-05-06 ENCOUNTER — Telehealth (HOSPITAL_COMMUNITY): Payer: Self-pay

## 2020-05-06 NOTE — Telephone Encounter (Signed)
Pt agreed to f/u in 6 months with mri/mra. AW

## 2020-08-04 ENCOUNTER — Ambulatory Visit: Payer: Medicare Other | Admitting: Cardiovascular Disease

## 2020-08-09 NOTE — Progress Notes (Signed)
Cardiology Office Note  Date: 08/10/2020   ID: Scott [name], DOB 09/11/1928, MRN [id number]  PCP:  Curlene Labrum, MD  Cardiologist:  No primary care provider on file. Electrophysiologist:  None   Chief Complaint: Follow-up permanent atrial fibrillation   History of Present Illness: Scott [name] is a 86 y.o. male with a history of permanent atrial fibrillation, HTN, HLD vertebrobasilar artery stenosis.  Last encounter with Dr. Bronson Ing 01/24/2020.  He was symptomatically stable.  He had undergone DCCV and 2019.  Later went back into atrial fibrillation.  Patient was started on rate control strategy using diltiazem, labetalol and apixaban.  Blood pressure was normal and there were no changes to therapy.  He remained on atorvastatin 10 mg daily.  He has known right-sided vertebrobasilar artery stenosis and followed by Dr. Estanislado Pandy.  Here for 103-month follow-up.  He denies any issues in the interim since last visit.  He is having significant stress secondary to taking care of his wife who has Alzheimer's and recurrent cancer.  He denies any recent anginal or exertional symptoms, PND, orthopnea, bleeding, CVA or TIA-like symptoms.  Lower extremity edema, claudication, DVT or PE-like symptoms.  He has significant back issues from multiple back surgeries as well as hip surgeries and knee surgeries as well.  Past Medical History:  Diagnosis Date  . Anxiety 10/31/2014   due to upcoming procedure  . Arrhythmia   . Arthritis   . Cancer ([place])    Melonoma on nose- removed  . Chronic kidney disease   . Dyspnea   . Dysrhythmia    afib  . Enlarged prostate   . GERD (gastroesophageal reflux disease)   . Headache   . Hyperlipidemia   . Hypertension   . Hypothyroidism   . Overactive bladder   . Stroke [facility])    TIA - "nothing taste right anymore, right eye pain  . TIA (transient ischemic attack)   . Wears glasses     Past Surgical History:  Procedure Laterality Date  . BACK  SURGERY  08/2013   x2  . CARDIOVERSION N/A 01/31/2018   Procedure: CARDIOVERSION;  Surgeon: Arnoldo Lenis, MD;  [facility];  Service: Endoscopy;  Laterality: N/A;  . CATARACT EXTRACTION    . COLONOSCOPY    . EYE SURGERY Bilateral    cataract  . IR ANGIO INTRA EXTRACRAN SEL COM CAROTID INNOMINATE BILAT MOD SED  08/04/2017  . IR ANGIO VERTEBRAL SEL VERTEBRAL BILAT MOD SED  08/04/2017  . IR ANGIO VERTEBRAL SEL VERTEBRAL UNI R MOD SED  11/01/2017  . IR RADIOLOGIST EVAL & MGMT  07/28/2017  . IR RADIOLOGIST EVAL & MGMT  08/22/2017  . IR RADIOLOGIST EVAL & MGMT  11/29/2017  . JOINT REPLACEMENT Left 10/2015   hip  . KNEE ARTHROPLASTY Left 1999  . KNEE ARTHROPLASTY Right 2008  . LUMBAR FUSION  06/2014  . RADIOLOGY WITH ANESTHESIA N/A 11/01/2017   Procedure: STENTING;  Surgeon: Luanne Bras, MD;  Location: [place];  Service: Radiology;  Laterality: N/A;  . TRANSURETHRAL RESECTION OF PROSTATE  03/2015  . TRIGGER FINGER RELEASE Right    3rd and 4th    Current Outpatient Medications  Medication Sig Dispense Refill  . acetaminophen (TYLENOL) 500 MG tablet Take 1,000 mg by mouth 2 (two) times daily.     Marland Kitchen apixaban (ELIQUIS) 2.5 MG TABS tablet Take 1 tablet (2.5 mg total) by mouth 2 (two) times daily. 60 tablet 6  . aspirin EC  81 MG tablet Take 81 mg by mouth at bedtime.     Marland Kitchen atorvastatin (LIPITOR) 10 MG tablet Take 10 mg by mouth every evening.     . Carboxymethylcellul-Glycerin (LUBRICATING EYE DROPS OP) Place 1-2 drops into both eyes 2 (two) times daily as needed (for dry eyes).     Marland Kitchen diltiazem (CARDIZEM) 60 MG tablet Take 1 tablet (60 mg total) by mouth 2 (two) times daily. (May take an extra 30mg  in the evening as needed)  Med increased 10/26/2017.   PLACE ON FILE 75 tablet 6  . doxazosin (CARDURA) 8 MG tablet Take 8 mg by mouth at bedtime.    . finasteride (PROSCAR) 5 MG tablet Take 5 mg by mouth daily.    . fluticasone (FLONASE) 50 MCG/ACT nasal spray Place 2 sprays into  both nostrils daily as needed for allergies or rhinitis.     Marland Kitchen labetalol (NORMODYNE) 200 MG tablet Take 200 mg by mouth 2 (two) times daily.    Marland Kitchen levothyroxine (SYNTHROID, LEVOTHROID) 150 MCG tablet Take 150 mcg daily before breakfast by mouth.    Marland Kitchen lisinopril (PRINIVIL,ZESTRIL) 40 MG tablet Take 40 mg by mouth daily.    Marland Kitchen omeprazole (PRILOSEC) 20 MG capsule Take 20 mg by mouth daily.    Marland Kitchen oxyCODONE-acetaminophen (PERCOCET) 10-325 MG tablet Take 1 tablet by mouth 2 (two) times daily as needed.    . trolamine salicylate (ASPERCREME) 10 % cream Apply 1 application topically as needed for muscle pain.     No current facility-administered medications for this visit.   Facility-Administered Medications Ordered in Other Visits  Medication Dose Route Frequency Provider Last Rate Last Admin  . clopidogrel (PLAVIX) tablet 75 mg  75 mg Oral 60 min Pre-Op Monia Sabal, PA-C       Allergies:  Tramadol   Social History: The patient  reports that he quit smoking about 41 years ago. His smoking use included cigarettes. He started smoking about 54 years ago. He quit after 40.00 years of use. He has never used smokeless tobacco. He reports that he does not drink alcohol and does not use drugs.   Family History: The patient's family history includes Coronary artery disease in his mother.   ROS:  Please see the history of present illness. Otherwise, complete review of systems is positive for none.  All other systems are reviewed and negative.   Physical Exam: VS:  BP 138/78   Pulse 91   Ht 5\' 6"  (1.676 m)   Wt 161 lb 3.2 oz (73.1 kg)   SpO2 99%   BMI 26.02 kg/m , BMI Body mass index is 26.02 kg/m.  Wt Readings from Last 3 Encounters:  08/10/20 161 lb 3.2 oz (73.1 kg)  01/24/20 169 lb (76.7 kg)  05/15/19 179 lb (81.2 kg)    General: Patient appears comfortable at rest. Neck: Supple, no elevated JVP or carotid bruits, no thyromegaly. Lungs: Clear to auscultation, nonlabored breathing at  rest. Cardiac: Irregularly irregular rate and rhythm, no S3 or significant systolic murmur, no pericardial rub. Extremities: No pitting edema, distal pulses 2+. Skin: Warm and dry. Musculoskeletal: No kyphosis. Neuropsychiatric: Alert and oriented x3, affect grossly appropriate.  ECG:  EKG January 24, 2020 atrial fibrillation rate of 99  Recent Labwork: No results found for requested labs within last 8760 hours.  No results found for: CHOL, TRIG, HDL, CHOLHDL, VLDL, LDLCALC, LDLDIRECT  Other Studies Reviewed Today:  05/04/2020 MRA HEAD FINDINGS  Both internal carotid arteries are widely patent through  the skull base and siphon regions. The anterior and middle cerebral vessels are patent without proximal stenosis, aneurysm or vascular malformation. No large or medium vessel occlusion identified in the anterior circulation.  Right vertebral artery V4 segment stenosis. Minimal luminal diameter 1.5 mm. Immediate proximal diameter 3.8 mm, consistent with 60% stenosis. This does not appear progressive since December. The left vertebral artery is a small vessel which shows atherosclerotic irregularity in its distal 1 cm, with stenosis just proximal to the basilar estimated at 50%. No basilar stenosis. Posterior circulation branch vessels show flow. Primary anterior circulation supply to the left posterior cerebral artery.  IMPRESSION: No acute brain finding. Old infarctions of the cerebellum, thalamus and hemispheric white matter.  Stable intracranial MR angiography. 60% stenosis of the right vertebral artery V4 segment. Atherosclerotic disease of the distal left V4 segment with estimated 50% stenosis just proximal to the basilar.  Assessment and Plan:  1. Permanent atrial fibrillation ([place])   2. Essential hypertension   3. Mixed hyperlipidemia   4. Vertebral artery disease ([place])    1. Permanent atrial fibrillation ([place]) He can feel occasional palpitations which do not  bother him and usually last less than 30 seconds.  Rate is controlled today at 91.  Continue Eliquis 2.5 mg p.o. twice daily.  Continue Cardizem 60 mg p.o. twice daily.  2. Essential hypertension Blood pressure slightly elevated today at 138/78.  Patient is under a lot of stress at home due to taking care of his wife who is in hospice for cancer and advanced Alzheimer's.  Continue labetalol l 200 mg p.o. twice daily.  Continue lisinopril 40 mg p.o. daily.  3. Mixed hyperlipidemia Continue atorvastatin 10 mg daily.  4. Vertebral artery disease ([place]) MRI brain 05/04/2020: Right vertebral artery V4 segment stenosis. Minimal luminal diameter 1.5 mm. Immediate proximal diameter 3.8 mm, consistent with 60% stenosis.  Follows with Dr. Estanislado Pandy  Medication Adjustments/Labs and Tests Ordered: Current medicines are reviewed at length with the patient today.  Concerns regarding medicines are outlined above.   Disposition: Follow-up with Dr. Domenic Polite or APP 6 months  Signed, Levell July, NP 08/10/2020 2:43 PM    [facility], [place] [address] Phone: [phone]; Fax: [phone]

## 2020-08-10 ENCOUNTER — Ambulatory Visit: Payer: Medicare Other | Admitting: Family Medicine

## 2020-08-10 ENCOUNTER — Encounter: Payer: Self-pay | Admitting: Family Medicine

## 2020-08-10 VITALS — BP 138/78 | HR 91 | Ht 66.0 in | Wt 161.2 lb

## 2020-08-10 DIAGNOSIS — I4821 Permanent atrial fibrillation: Secondary | ICD-10-CM

## 2020-08-10 DIAGNOSIS — I779 Disorder of arteries and arterioles, unspecified: Secondary | ICD-10-CM

## 2020-08-10 DIAGNOSIS — E782 Mixed hyperlipidemia: Secondary | ICD-10-CM | POA: Diagnosis not present

## 2020-08-10 DIAGNOSIS — I10 Essential (primary) hypertension: Secondary | ICD-10-CM | POA: Diagnosis not present

## 2020-08-10 NOTE — Patient Instructions (Signed)
Medication Instructions:  Continue all current medications.   Labwork: none  Testing/Procedures: none  Follow-Up: 6 months   Any Other Special Instructions Will Be Listed Below (If Applicable).   If you need a refill on your cardiac medications before your next appointment, please call your pharmacy.  

## 2020-09-12 DIAGNOSIS — Z23 Encounter for immunization: Secondary | ICD-10-CM | POA: Diagnosis not present

## 2020-11-24 ENCOUNTER — Other Ambulatory Visit (HOSPITAL_COMMUNITY): Payer: Self-pay | Admitting: Interventional Radiology

## 2020-11-24 DIAGNOSIS — I771 Stricture of artery: Secondary | ICD-10-CM

## 2020-12-09 ENCOUNTER — Other Ambulatory Visit: Payer: Self-pay

## 2020-12-09 ENCOUNTER — Ambulatory Visit (HOSPITAL_COMMUNITY)
Admission: RE | Admit: 2020-12-09 | Discharge: 2020-12-09 | Disposition: A | Discharge status: Home / Self Care | Payer: Medicare HMO | Source: Ambulatory Visit | Attending: Interventional Radiology | Admitting: Interventional Radiology

## 2020-12-09 DIAGNOSIS — I6501 Occlusion and stenosis of right vertebral artery: Secondary | ICD-10-CM | POA: Insufficient documentation

## 2020-12-09 DIAGNOSIS — I771 Stricture of artery: Secondary | ICD-10-CM

## 2020-12-09 IMAGING — MR MR MRA HEAD W/O CM
1 series · 20 of 48 positions shown · non-contrast
Comparison: [DATE]

CLINICAL DATA: Vertebrobasilar stenosis

EXAM:
MRA HEAD WITHOUT CONTRAST
TECHNIQUE: Angiographic images of the [place] were obtained using MRA
technique without intravenous contrast.

[Series 5: 3d cow · axial · 0.5mm · 0.41mm/px · z∈[-45,+35]mm · 20 of 172 slices shown]
[im 1/172]
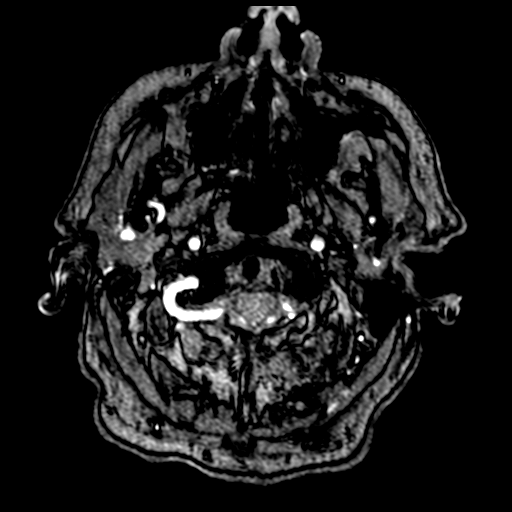
[im 4/172]
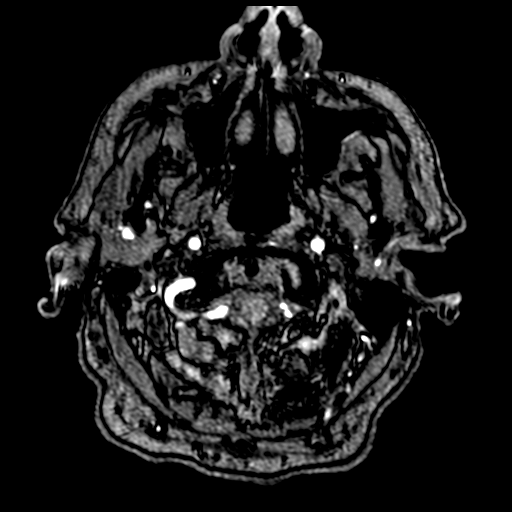
[im 8/172]
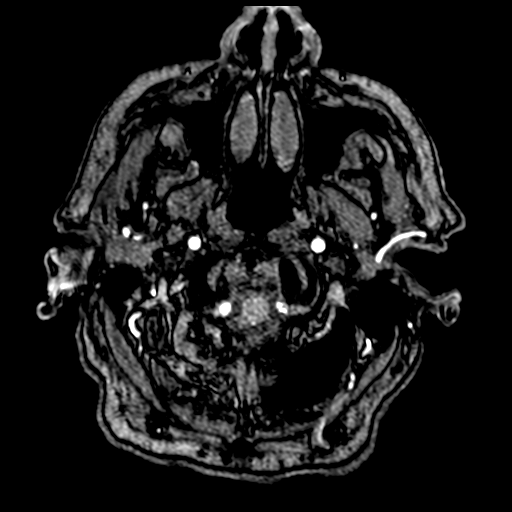
[im 11/172]
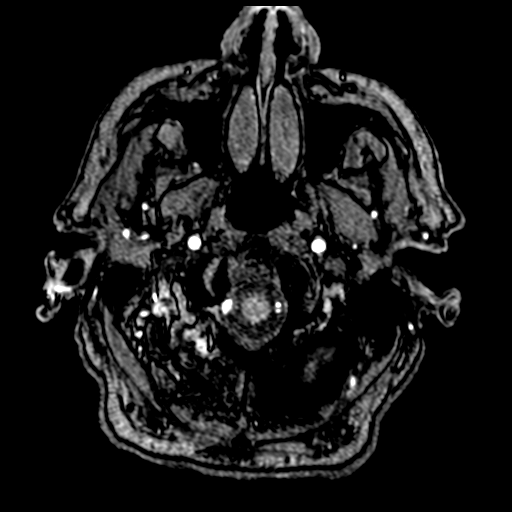
[im 15/172]
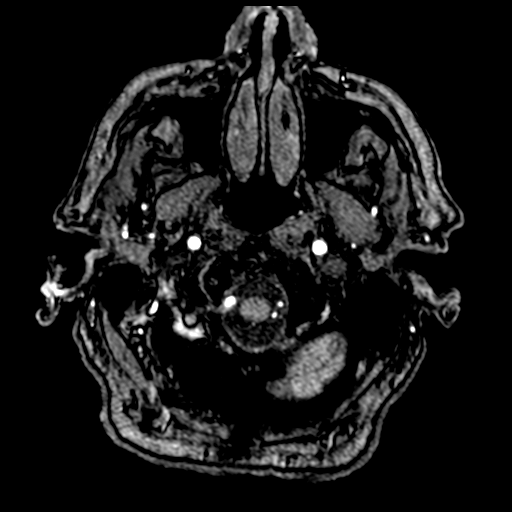
[im 19/172]
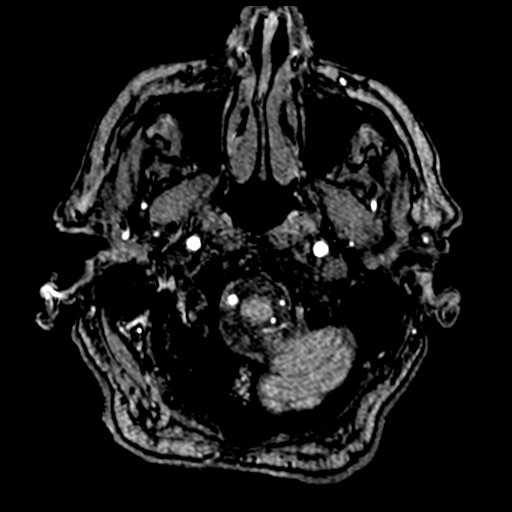
[im 22/172]
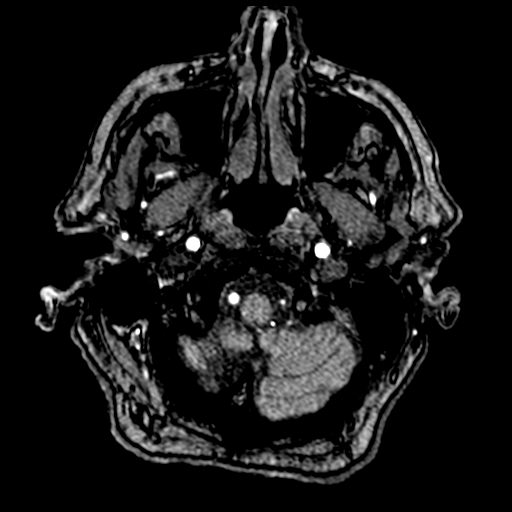
[im 26/172]
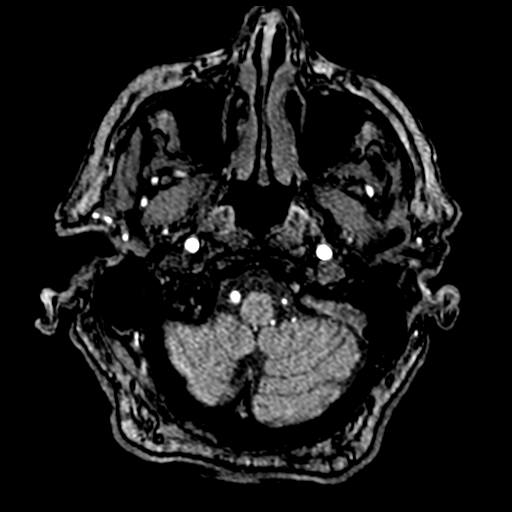
[im 30/172]
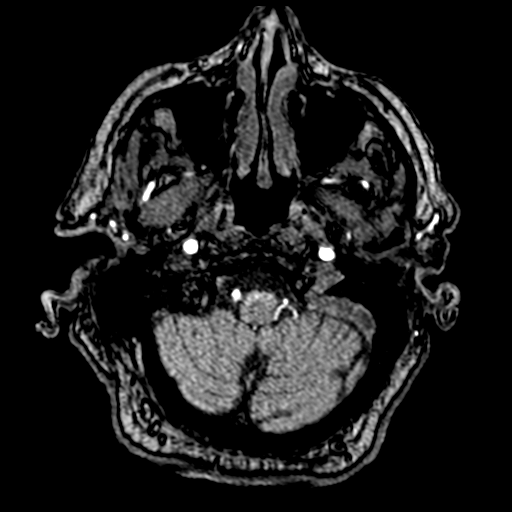
[im 33/172]
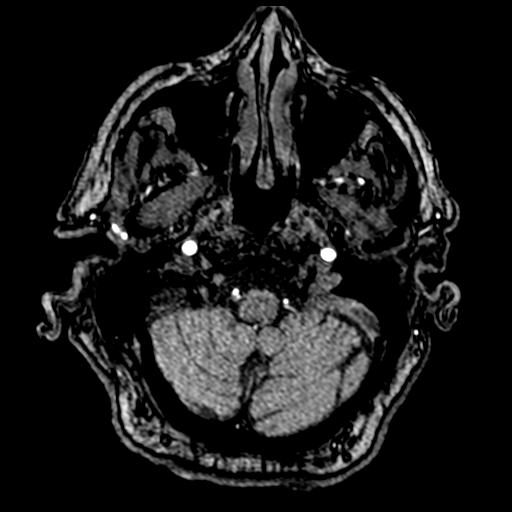
[im 37/172]
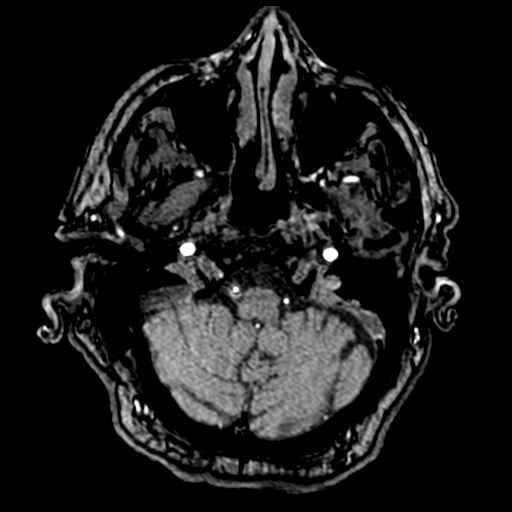
[im 41/172]
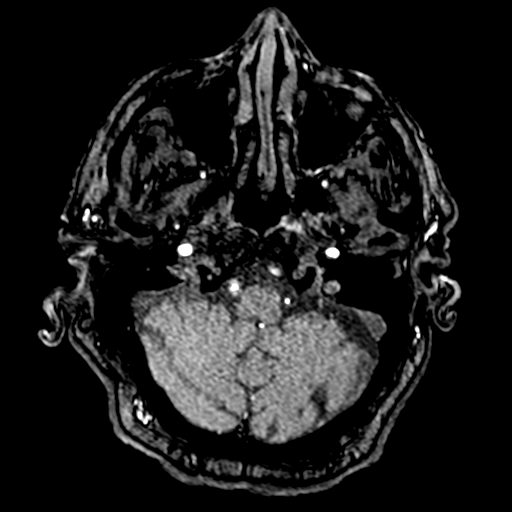
[im 55/172]
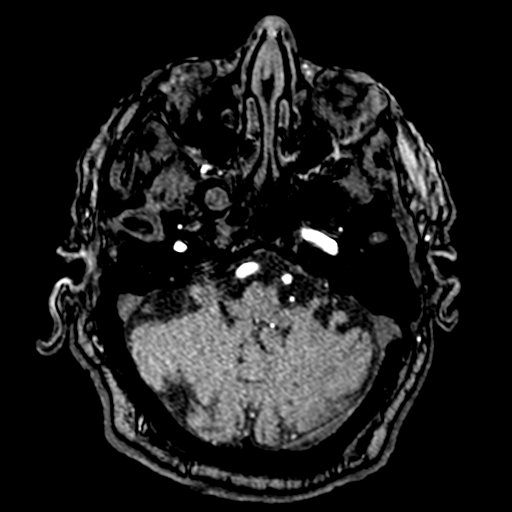
[im 77/172]
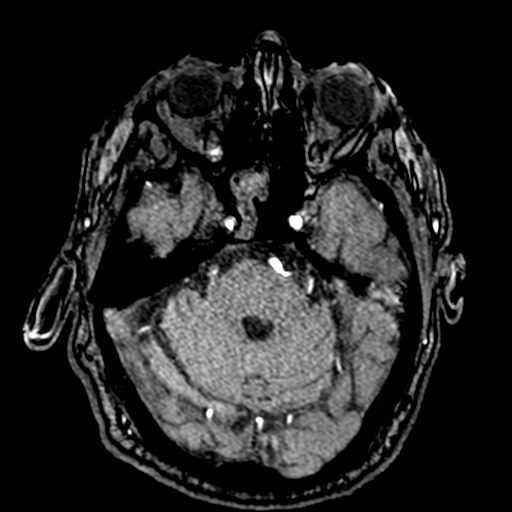
[im 88/172]
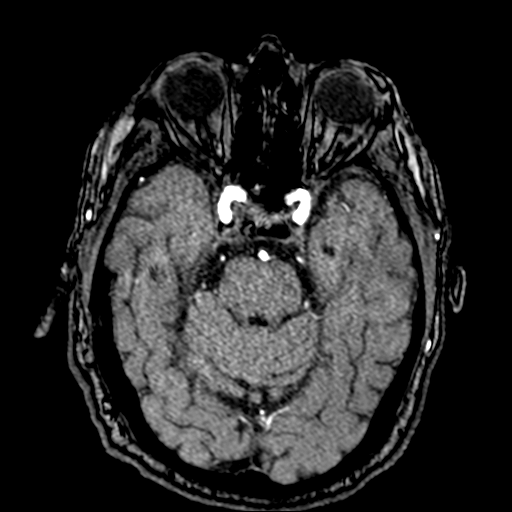
[im 99/172]
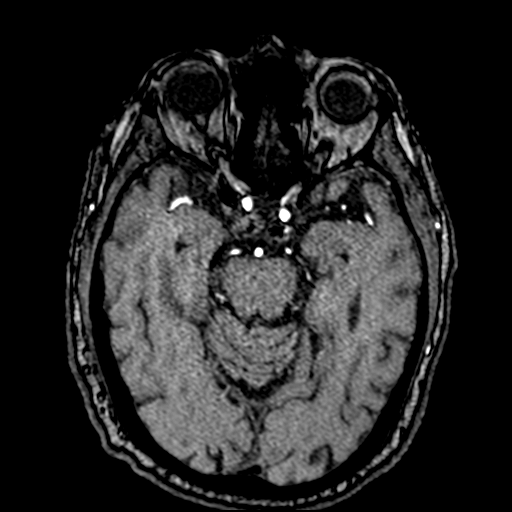
[im 121/172]
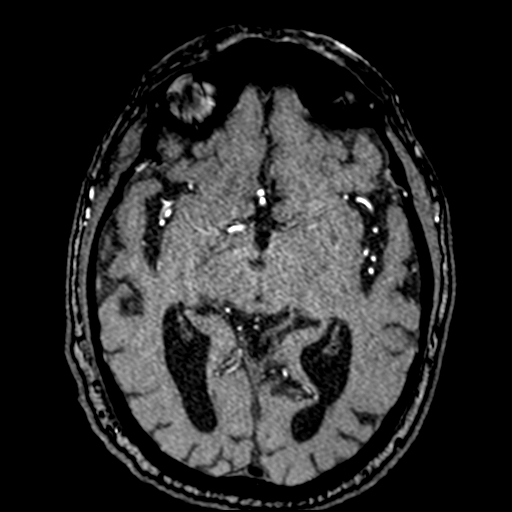
[im 142/172]
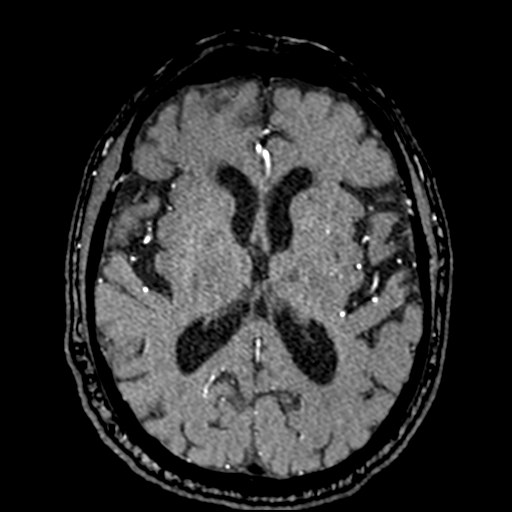
[im 146/172]
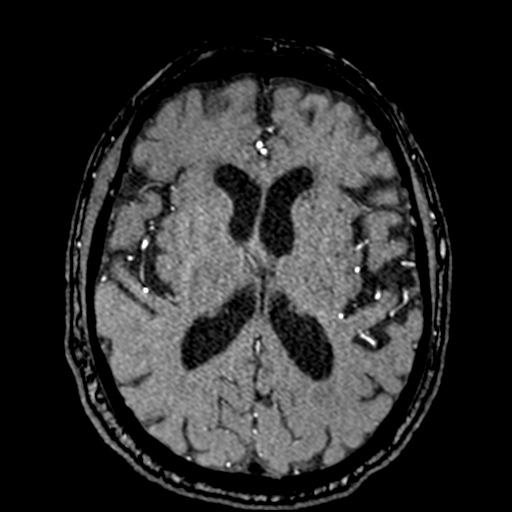
[im 164/172]
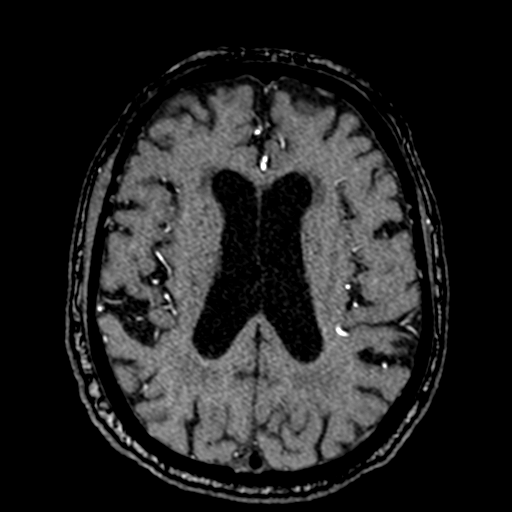

[20 of 48 positions shown; findings below may reference images not displayed]

FINDINGS: POSTERIOR CIRCULATION:

--Vertebral arteries: Moderate stenosis of the right V4 segment is
unchanged. Normal non dominant left vertebral artery.

--Inferior cerebellar arteries: Normal.

--Basilar artery: Normal.

--Superior cerebellar arteries: Normal.

--Posterior cerebral arteries: Normal.

ANTERIOR CIRCULATION:

--Intracranial internal carotid arteries: Normal.

--Anterior cerebral arteries (ACA): Normal.

--Middle cerebral arteries (MCA): Normal.

ANATOMIC VARIANTS: Both posterior communicating arteries are patent.
IMPRESSION: Unchanged moderate stenosis of the right vertebral artery V4
segment. Otherwise normal intracranial MRA.

## 2020-12-11 ENCOUNTER — Telehealth (HOSPITAL_COMMUNITY): Payer: Self-pay

## 2020-12-11 NOTE — Telephone Encounter (Signed)
Pt agreed to f/u in 6 months with mra. AW  

## 2021-02-08 ENCOUNTER — Ambulatory Visit (INDEPENDENT_AMBULATORY_CARE_PROVIDER_SITE_OTHER): Payer: Medicare HMO | Admitting: Cardiology

## 2021-02-08 ENCOUNTER — Encounter: Payer: Self-pay | Admitting: *Deleted

## 2021-02-08 ENCOUNTER — Encounter: Payer: Self-pay | Admitting: Cardiology

## 2021-02-08 VITALS — BP 134/80 | HR 84 | Ht 66.0 in | Wt 166.2 lb

## 2021-02-08 DIAGNOSIS — E782 Mixed hyperlipidemia: Secondary | ICD-10-CM | POA: Diagnosis not present

## 2021-02-08 DIAGNOSIS — I10 Essential (primary) hypertension: Secondary | ICD-10-CM | POA: Diagnosis not present

## 2021-02-08 DIAGNOSIS — I4821 Permanent atrial fibrillation: Secondary | ICD-10-CM

## 2021-02-08 NOTE — Progress Notes (Signed)
Clinical Summary Mr. Thibeaux is a 87 y.o.male former patient of Dr Bronson Ing, this is our first visit together.   1. Afib He underwent direct-current cardioversion in 2019 and subsequently went back into atrial fibrillation. At that time, I discussion was held with him about initiating antiarrhythmic therapy and the possibility of repeat direct-current cardioversion but the patient preferred a rate control strategy.  - infrequent palpitations - compliant with meds, takes cardizem once daily will take additional as needed - no bleeding on eliquis  2. HTN - compliant with meds   3. Hyperlipidemia - labs followed by pcp   4. Prior CVA   5. Vertebrobasilar artery stenosis: He has known right-sided stenosis and this is followed by Dr. Estanislado Pandy. He is on ASA 81mg  daily    SH: wife passed in Nov due to bladder cancer.   Past Medical History:  Diagnosis Date  . Anxiety 10/31/2014   due to upcoming procedure  . Arrhythmia   . Arthritis   . Cancer ([place])    Melonoma on nose- removed  . Chronic kidney disease   . Dyspnea   . Dysrhythmia    afib  . Enlarged prostate   . GERD (gastroesophageal reflux disease)   . Headache   . Hyperlipidemia   . Hypertension   . Hypothyroidism   . Overactive bladder   . Stroke [facility])    TIA - "nothing taste right anymore, right eye pain  . TIA (transient ischemic attack)   . Wears glasses      Allergies  Allergen Reactions  . Tramadol Nausea Only     Current Outpatient Medications  Medication Sig Dispense Refill  . acetaminophen (TYLENOL) 500 MG tablet Take 1,000 mg by mouth 2 (two) times daily.     Marland Kitchen apixaban (ELIQUIS) 2.5 MG TABS tablet Take 1 tablet (2.5 mg total) by mouth 2 (two) times daily. 60 tablet 6  . aspirin EC 81 MG tablet Take 81 mg by mouth at bedtime.     Marland Kitchen atorvastatin (LIPITOR) 10 MG tablet Take 10 mg by mouth every evening.     . Carboxymethylcellul-Glycerin (LUBRICATING EYE DROPS OP) Place 1-2 drops  into both eyes 2 (two) times daily as needed (for dry eyes).     Marland Kitchen diltiazem (CARDIZEM) 60 MG tablet Take 1 tablet (60 mg total) by mouth 2 (two) times daily. (May take an extra 30mg  in the evening as needed)  Med increased 10/26/2017.   PLACE ON FILE 75 tablet 6  . doxazosin (CARDURA) 8 MG tablet Take 8 mg by mouth at bedtime.    . finasteride (PROSCAR) 5 MG tablet Take 5 mg by mouth daily.    . fluticasone (FLONASE) 50 MCG/ACT nasal spray Place 2 sprays into both nostrils daily as needed for allergies or rhinitis.     Marland Kitchen labetalol (NORMODYNE) 200 MG tablet Take 200 mg by mouth 2 (two) times daily.    Marland Kitchen levothyroxine (SYNTHROID, LEVOTHROID) 150 MCG tablet Take 150 mcg daily before breakfast by mouth.    Marland Kitchen lisinopril (PRINIVIL,ZESTRIL) 40 MG tablet Take 40 mg by mouth daily.    Marland Kitchen omeprazole (PRILOSEC) 20 MG capsule Take 20 mg by mouth daily.    Marland Kitchen oxyCODONE-acetaminophen (PERCOCET) 10-325 MG tablet Take 1 tablet by mouth 2 (two) times daily as needed.    . trolamine salicylate (ASPERCREME) 10 % cream Apply 1 application topically as needed for muscle pain.     No current facility-administered medications for this visit.  Facility-Administered Medications Ordered in Other Visits  Medication Dose Route Frequency Provider Last Rate Last Admin  . clopidogrel (PLAVIX) tablet 75 mg  75 mg Oral 60 min Pre-Op Monia Sabal, PA-C         Past Surgical History:  Procedure Laterality Date  . BACK SURGERY  08/2013   x2  . CARDIOVERSION N/A 01/31/2018   Procedure: CARDIOVERSION;  Surgeon: Arnoldo Lenis, MD;  [facility];  Service: Endoscopy;  Laterality: N/A;  . CATARACT EXTRACTION    . COLONOSCOPY    . EYE SURGERY Bilateral    cataract  . IR ANGIO INTRA EXTRACRAN SEL COM CAROTID INNOMINATE BILAT MOD SED  08/04/2017  . IR ANGIO VERTEBRAL SEL VERTEBRAL BILAT MOD SED  08/04/2017  . IR ANGIO VERTEBRAL SEL VERTEBRAL UNI R MOD SED  11/01/2017  . IR RADIOLOGIST EVAL & MGMT  07/28/2017   . IR RADIOLOGIST EVAL & MGMT  08/22/2017  . IR RADIOLOGIST EVAL & MGMT  11/29/2017  . JOINT REPLACEMENT Left 10/2015   hip  . KNEE ARTHROPLASTY Left 1999  . KNEE ARTHROPLASTY Right 2008  . LUMBAR FUSION  06/2014  . RADIOLOGY WITH ANESTHESIA N/A 11/01/2017   Procedure: STENTING;  Surgeon: Luanne Bras, MD;  Location: [place];  Service: Radiology;  Laterality: N/A;  . TRANSURETHRAL RESECTION OF PROSTATE  03/2015  . TRIGGER FINGER RELEASE Right    3rd and 4th     Allergies  Allergen Reactions  . Tramadol Nausea Only      Family History  Problem Relation Age of Onset  . Coronary artery disease Mother      Social History Mr. Stelzner reports that he quit smoking about 42 years ago. His smoking use included cigarettes. He started smoking about 55 years ago. He quit after 40.00 years of use. He has never used smokeless tobacco. Mr. Beckley reports no history of alcohol use.   Review of Systems CONSTITUTIONAL: No weight loss, fever, chills, weakness or fatigue.  HEENT: Eyes: No visual loss, blurred vision, double vision or yellow sclerae.No hearing loss, sneezing, congestion, runny nose or sore throat.  SKIN: No rash or itching.  CARDIOVASCULAR: per hpi RESPIRATORY: No shortness of breath, cough or sputum.  GASTROINTESTINAL: No anorexia, nausea, vomiting or diarrhea. No abdominal pain or blood.  GENITOURINARY: No burning on urination, no polyuria NEUROLOGICAL: No headache, dizziness, syncope, paralysis, ataxia, numbness or tingling in the extremities. No change in bowel or bladder control.  MUSCULOSKELETAL: No muscle, back pain, joint pain or stiffness.  LYMPHATICS: No enlarged nodes. No history of splenectomy.  PSYCHIATRIC: No history of depression or anxiety.  ENDOCRINOLOGIC: No reports of sweating, cold or heat intolerance. No polyuria or polydipsia.  Marland Kitchen   Physical Examination Today's Vitals   02/08/21 1324  BP: 134/80  Pulse: 84  SpO2: 96%  Weight: 166 lb 3.2 oz  (75.4 kg)  Height: 5\' 6"  (1.676 m)   Body mass index is 26.83 kg/m.  Gen: resting comfortably, no acute distress HEENT: no scleral icterus, pupils equal round and reactive, no palptable cervical adenopathy,  CV: irreg, no mr/g, no jvd Resp: Clear to auscultation bilaterally GI: abdomen is soft, non-tender, non-distended, normal bowel sounds, no hepatosplenomegaly MSK: extremities are warm, no edema.  Skin: warm, no rash Neuro:  no focal deficits Psych: appropriate affect     Assessment and Plan   1. Afib - overall no significant symptoms, rates are controled - EKG today shows rate controlled afi - continue current meds. On  eliquis and asa given verterbobasilar disease followed by interventional radiology  2. HTN - at goal, continue current meds  3. Hyperlipidemia - continue statin, request pcp labs       Arnoldo Lenis, M.D.

## 2021-02-08 NOTE — Patient Instructions (Signed)
Your physician recommends that you schedule a follow-up appointment in: 6 MONTHS WITH DR BRANCH  Your physician recommends that you continue on your current medications as directed. Please refer to the Current Medication list given to you today.  Thank you for choosing [facility]!!    

## 2021-07-06 ENCOUNTER — Telehealth (HOSPITAL_COMMUNITY): Payer: Self-pay

## 2021-07-06 ENCOUNTER — Other Ambulatory Visit (HOSPITAL_COMMUNITY): Payer: Self-pay | Admitting: Interventional Radiology

## 2021-07-06 DIAGNOSIS — I771 Stricture of artery: Secondary | ICD-10-CM

## 2021-07-06 NOTE — Telephone Encounter (Signed)
Called to schedule f/u mra, no answer, left vm. AW 

## 2021-07-19 ENCOUNTER — Other Ambulatory Visit: Payer: Self-pay

## 2021-07-19 ENCOUNTER — Ambulatory Visit (HOSPITAL_COMMUNITY)
Admission: RE | Admit: 2021-07-19 | Discharge: 2021-07-19 | Disposition: A | Discharge status: Home / Self Care | Payer: Medicare HMO | Source: Ambulatory Visit | Attending: Interventional Radiology | Admitting: Interventional Radiology

## 2021-07-19 DIAGNOSIS — I771 Stricture of artery: Secondary | ICD-10-CM

## 2021-07-19 DIAGNOSIS — I6501 Occlusion and stenosis of right vertebral artery: Secondary | ICD-10-CM | POA: Insufficient documentation

## 2021-07-19 IMAGING — MR MR MRA HEAD W/O CM
2 series · 19 of 48 positions shown · non-contrast
Comparison: MRA head [DATE].  CTA head and neck [DATE]

CLINICAL DATA: Follow-up vertebral stenosis

EXAM:
MRA HEAD WITHOUT CONTRAST
TECHNIQUE: Angiographic images of the Circle of Willis were acquired using MRA
technique without intravenous contrast.

[Series 2: T1 · sagittal · 5.0mm · 0.47mm/px · 7 of 25 slices shown]
[im 1/25]
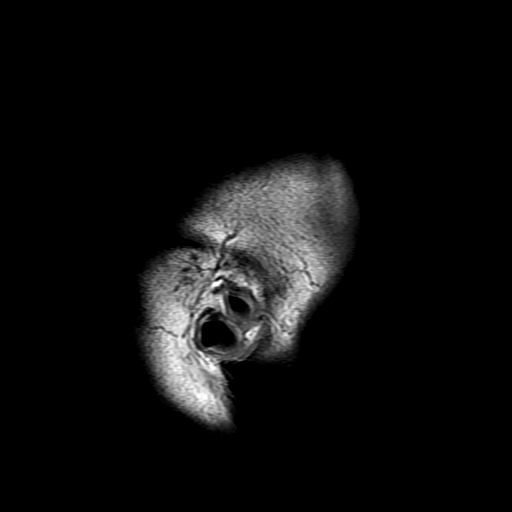
[im 5/25]
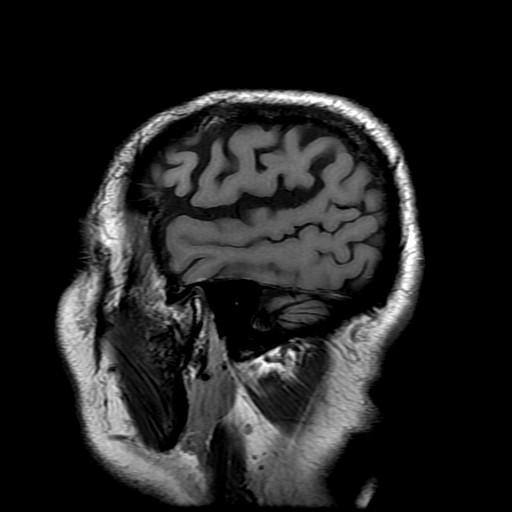
[im 9/25]
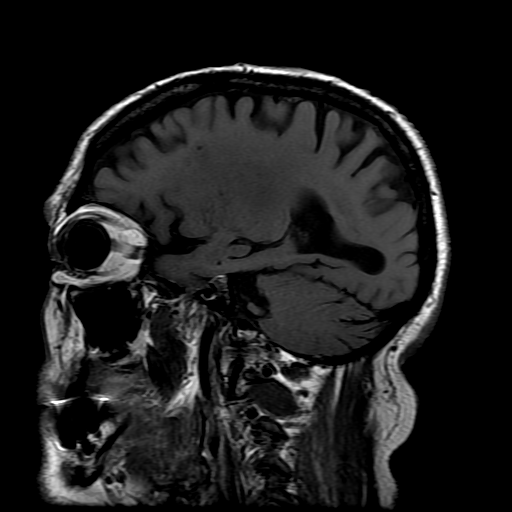
[im 13/25]
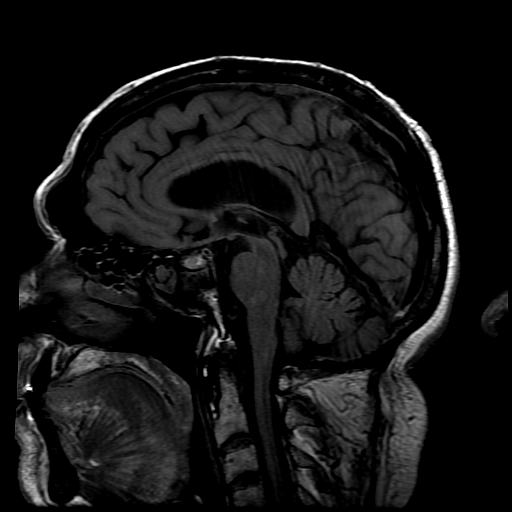
[im 17/25]
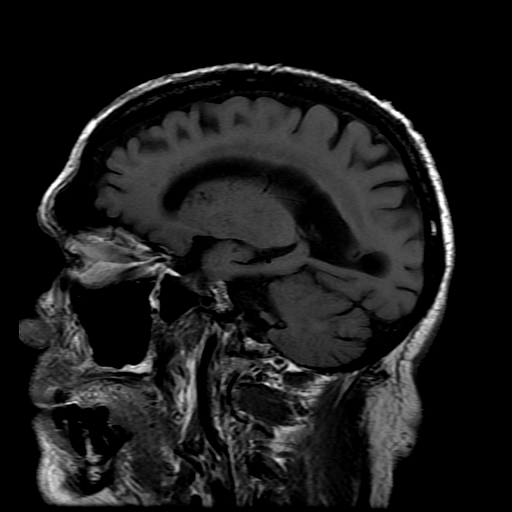
[im 21/25]
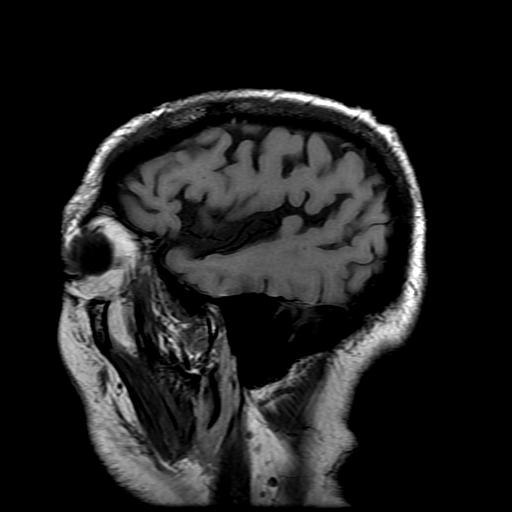
[im 25/25]
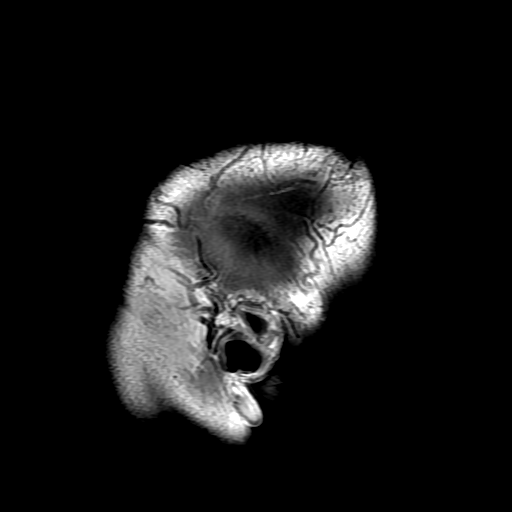

[Series 3: (id) mt fs · axial · 1.4mm · 0.43mm/px · z∈[-17,+73]mm · 12 of 136 slices shown]
[im 1/136]
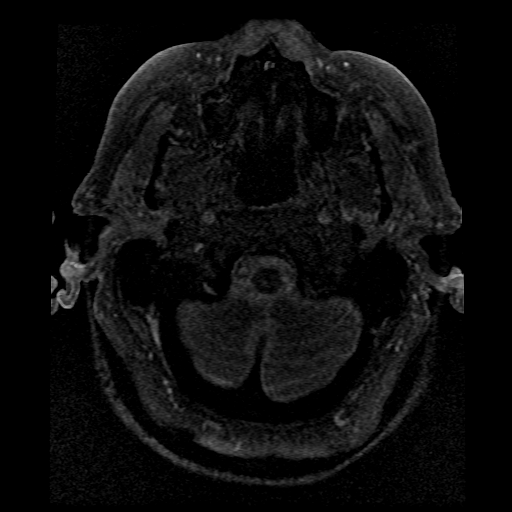
[im 7/136]
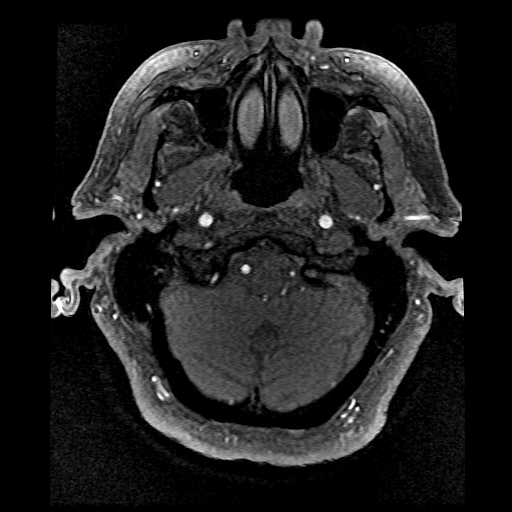
[im 21/136]
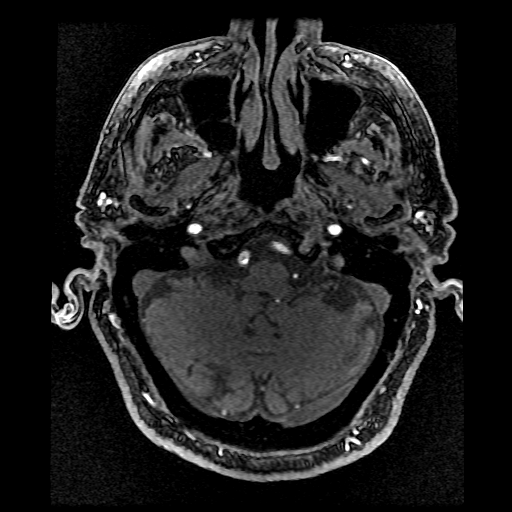
[im 24/136]
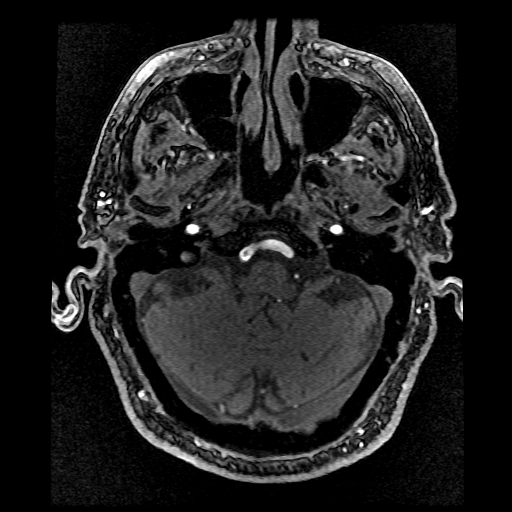
[im 41/136]
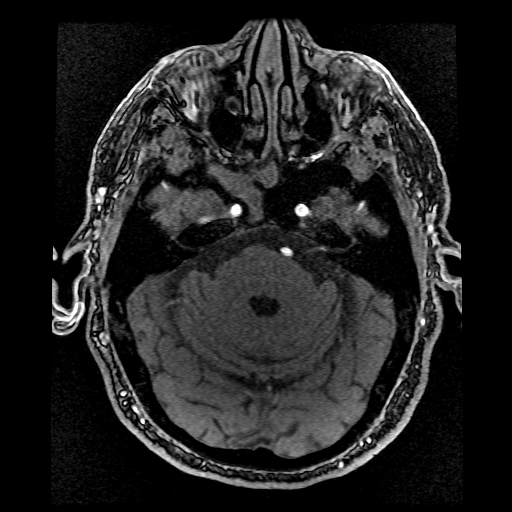
[im 58/136]
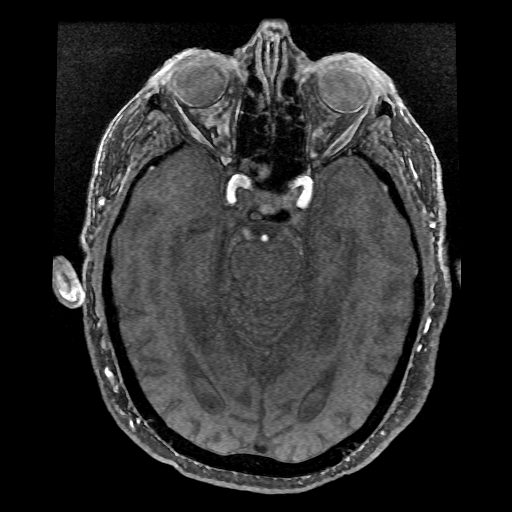
[im 68/136]
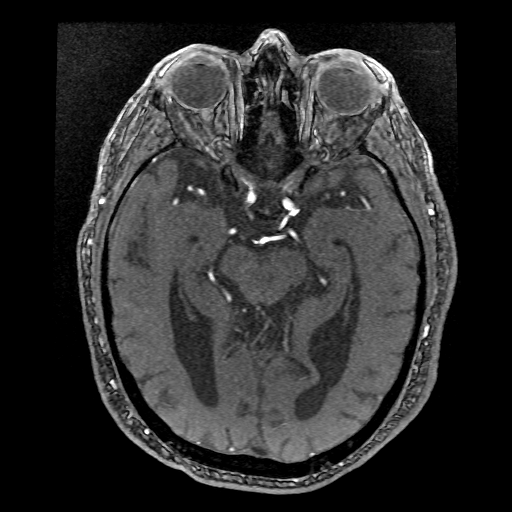
[im 78/136]
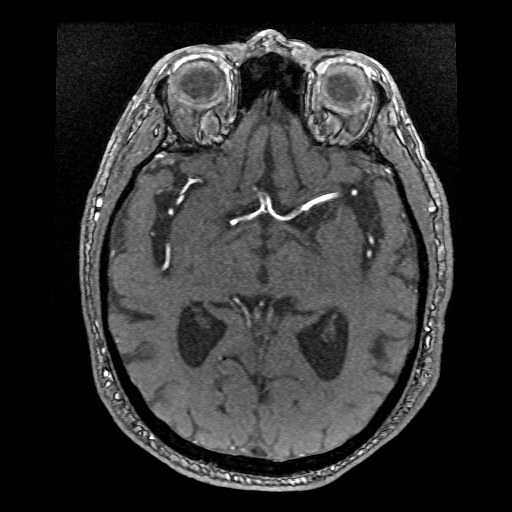
[im 95/136]
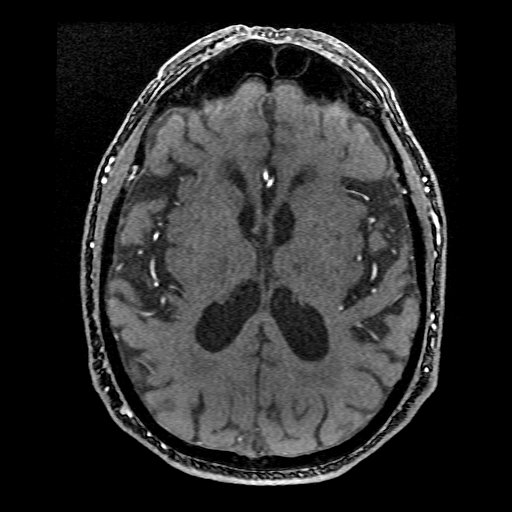
[im 112/136]
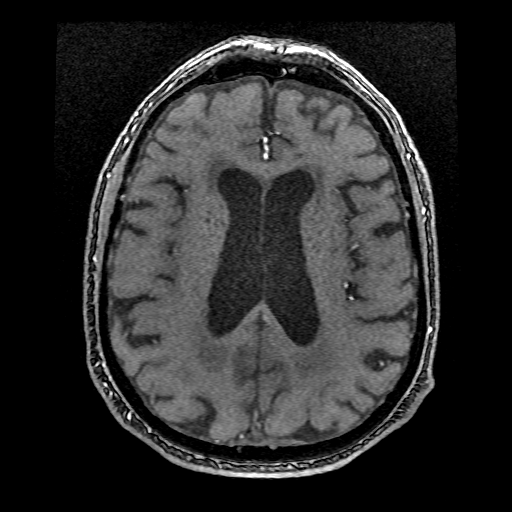
[im 115/136]
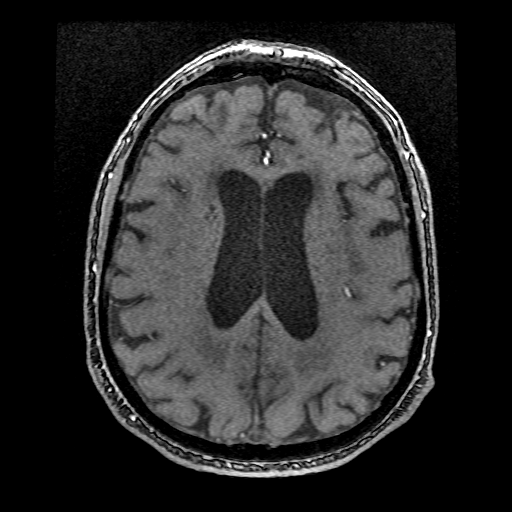
[im 129/136]
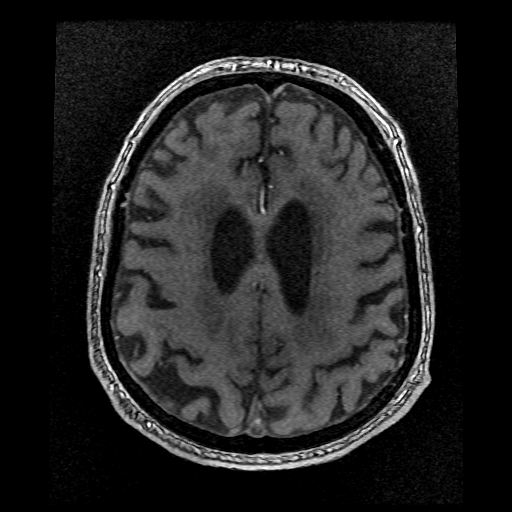

[19 of 48 positions shown; findings below may reference images not displayed]

FINDINGS: Anterior circulation: Mild atherosclerotic irregularity in the
cavernous carotid bilaterally without stenosis. Mild stenosis left
A2 segment, not seen on prior studies. Right anterior cerebral
artery widely patent. Middle cerebral arteries widely patent
bilaterally. Negative for vascular malformation.

Posterior circulation: Right vertebral artery dominant. Moderate
stenosis distal right vertebral artery unchanged. This corresponds
to atherosclerotic plaque on CT. Small left vertebral artery patent
without significant stenosis. Basilar widely patent. AICA, superior
cerebellar, posterior cerebral arteries patent bilaterally.
Posterior communicating artery patent bilaterally. Negative for
vascular malformation.

Anatomic variants: None

Other: None
IMPRESSION: 1. Moderate stenosis distal right vertebral artery unchanged from
prior studies. This appears atherosclerotic in nature on prior CTA
2. Mild to moderate stenosis left A2 segment, not seen on prior
imaging.

## 2021-07-21 ENCOUNTER — Telehealth (HOSPITAL_COMMUNITY): Payer: Self-pay

## 2021-07-21 NOTE — Telephone Encounter (Signed)
Called pt regarding recent imaging, no answer, left vm. AW  

## 2021-07-26 ENCOUNTER — Telehealth (HOSPITAL_COMMUNITY): Payer: Self-pay

## 2021-07-26 NOTE — Telephone Encounter (Signed)
Pt agreed to f/u in 6 months with mra. AW  

## 2021-09-01 ENCOUNTER — Encounter: Payer: Self-pay | Admitting: *Deleted

## 2021-09-01 ENCOUNTER — Ambulatory Visit: Payer: Medicare HMO | Admitting: Cardiology

## 2021-09-01 ENCOUNTER — Encounter: Payer: Self-pay | Admitting: Cardiology

## 2021-09-01 VITALS — BP 130/80 | HR 76 | Ht 66.0 in | Wt 168.4 lb

## 2021-09-01 DIAGNOSIS — I4821 Permanent atrial fibrillation: Secondary | ICD-10-CM

## 2021-09-01 DIAGNOSIS — E782 Mixed hyperlipidemia: Secondary | ICD-10-CM | POA: Diagnosis not present

## 2021-09-01 DIAGNOSIS — I10 Essential (primary) hypertension: Secondary | ICD-10-CM | POA: Diagnosis not present

## 2021-09-01 NOTE — Progress Notes (Signed)
Clinical Summary Scott [name] seen today for follow up of the following medical problems.    1. Afib  He underwent direct-current cardioversion in 2019 and subsequently went back into atrial fibrillation.  At that time, I discussion was held with him about initiating antiarrhythmic therapy and the possibility of repeat direct-current cardioversion but the patient preferred a rate control strategy.     - infrequent palpitaitons, mild - - compliant with meds - no bleeding on eliquis. Has been on 2.5mg  bid, previosuly Cr was above 1.5 though most recently below. Wt is 76 kg.     2. HTN - he is compliant with meds   3. Hyperlipidemia - pcp follows labs - he is on atorvastatin     4. Prior CVA     5.  Vertebrobasilar artery stenosis: He has known right-sided stenosis and this is followed by Scott [name]. He is on ASA 81mg  daily       SH: wife passed in Nov due to bladder cancer.    Past Medical History:  Diagnosis Date   Anxiety 10/31/2014   due to upcoming procedure   Arrhythmia    Arthritis    Cancer ([place])    Melonoma on nose- removed   Chronic kidney disease    Dyspnea    Dysrhythmia    afib   Enlarged prostate    GERD (gastroesophageal reflux disease)    Headache    Hyperlipidemia    Hypertension    Hypothyroidism    Overactive bladder    Stroke ([place])    TIA - "nothing taste right anymore, right eye pain   TIA (transient ischemic attack)    Wears glasses      Allergies  Allergen Reactions   Tramadol Nausea Only     Current Outpatient Medications  Medication Sig Dispense Refill   acetaminophen (TYLENOL) 500 MG tablet Take 1,000 mg by mouth 2 (two) times daily.     apixaban (ELIQUIS) 2.5 MG TABS tablet Take 1 tablet (2.5 mg total) by mouth 2 (two) times daily. 60 tablet 6   aspirin EC 81 MG tablet Take 81 mg by mouth at bedtime.      atorvastatin (LIPITOR) 10 MG tablet Take 10 mg by mouth every evening.      diltiazem  (CARDIZEM) 60 MG tablet Take 1 tablet (60 mg total) by mouth 2 (two) times daily. (May take an extra 30mg  in the evening as needed)  Med increased 10/26/2017.   PLACE ON FILE 75 tablet 6   doxazosin (CARDURA) 8 MG tablet Take 8 mg by mouth at bedtime.     finasteride (PROSCAR) 5 MG tablet Take 5 mg by mouth daily.     fluticasone (FLONASE) 50 MCG/ACT nasal spray Place 2 sprays into both nostrils daily as needed for allergies or rhinitis.      labetalol (NORMODYNE) 200 MG tablet Take 200 mg by mouth 2 (two) times daily.     levothyroxine (SYNTHROID, LEVOTHROID) 150 MCG tablet Take 150 mcg daily before breakfast by mouth.     lisinopril (PRINIVIL,ZESTRIL) 40 MG tablet Take 40 mg by mouth daily.     omeprazole (PRILOSEC) 20 MG capsule Take 20 mg by mouth daily.     oxyCODONE-acetaminophen (PERCOCET) 10-325 MG tablet Take 1 tablet by mouth 2 (two) times daily as needed.     trolamine salicylate (ASPERCREME) 10 % cream Apply 1 application topically as needed for muscle pain.     No  current facility-administered medications for this visit.   Facility-Administered Medications Ordered in Other Visits  Medication Dose Route Frequency Provider Last Rate Last Admin   clopidogrel (PLAVIX) tablet 75 mg  75 mg Oral 60 min Pre-Op Monia Sabal, PA-C         Past Surgical History:  Procedure Laterality Date   BACK SURGERY  08/2013   x2   CARDIOVERSION N/A 01/31/2018   Procedure: CARDIOVERSION;  Surgeon: Arnoldo Lenis, MD;  [facility];  Service: Endoscopy;  Laterality: N/A;   CATARACT EXTRACTION     COLONOSCOPY     EYE SURGERY Bilateral    cataract   IR ANGIO INTRA EXTRACRAN SEL COM CAROTID INNOMINATE BILAT MOD SED  08/04/2017   IR ANGIO VERTEBRAL SEL VERTEBRAL BILAT MOD SED  08/04/2017   IR ANGIO VERTEBRAL SEL VERTEBRAL UNI R MOD SED  11/01/2017   IR RADIOLOGIST EVAL & MGMT  07/28/2017   IR RADIOLOGIST EVAL & MGMT  08/22/2017   IR RADIOLOGIST EVAL & MGMT  11/29/2017   JOINT  REPLACEMENT Left 10/2015   hip   KNEE ARTHROPLASTY Left 1999   KNEE ARTHROPLASTY Right 2008   LUMBAR FUSION  06/2014   RADIOLOGY WITH ANESTHESIA N/A 11/01/2017   Procedure: STENTING;  Surgeon: Luanne Bras, MD;  Location: [place];  Service: Radiology;  Laterality: N/A;   TRANSURETHRAL RESECTION OF PROSTATE  03/2015   TRIGGER FINGER RELEASE Right    3rd and 4th     Allergies  Allergen Reactions   Tramadol Nausea Only      Family History  Problem Relation Age of Onset   Coronary artery disease Mother      Social History [name] reports that he quit smoking about 42 years ago. His smoking use included cigarettes. He started smoking about 55 years ago. He has never used smokeless tobacco. [name] reports no history of alcohol use.   Review of Systems CONSTITUTIONAL: No weight loss, fever, chills, weakness or fatigue.  HEENT: Eyes: No visual loss, blurred vision, double vision or yellow sclerae.No hearing loss, sneezing, congestion, runny nose or sore throat.  SKIN: No rash or itching.  CARDIOVASCULAR: per hpi RESPIRATORY: No shortness of breath, cough or sputum.  GASTROINTESTINAL: No anorexia, nausea, vomiting or diarrhea. No abdominal pain or blood.  GENITOURINARY: No burning on urination, no polyuria NEUROLOGICAL: No headache, dizziness, syncope, paralysis, ataxia, numbness or tingling in the extremities. No change in bowel or bladder control.  MUSCULOSKELETAL: No muscle, back pain, joint pain or stiffness.  LYMPHATICS: No enlarged nodes. No history of splenectomy.  PSYCHIATRIC: No history of depression or anxiety.  ENDOCRINOLOGIC: No reports of sweating, cold or heat intolerance. No polyuria or polydipsia.  Marland Kitchen   Physical Examination Today's Vitals   09/01/21 0908  BP: 130/80  Pulse: 76  SpO2: 98%  Weight: 168 lb 6.4 oz (76.4 kg)  Height: 5\' 6"  (1.676 m)   Body mass index is 27.18 kg/m.  Gen: resting comfortably, no acute distress HEENT: no scleral  icterus, pupils equal round and reactive, no palptable cervical adenopathy,  CV: irreg, no mr/ gno jvd Resp: Clear to auscultation bilaterally GI: abdomen is soft, non-tender, non-distended, normal bowel sounds, no hepatosplenomegaly MSK: extremities are warm, no edema.  Skin: warm, no rash Neuro:  no focal deficits Psych: appropriate affect   Diagnostic Studies     Assessment and Plan  1. Afib - doing well, cotinue current meds - follow renal function over time, if Cr consistently below  1.5 would need to increase eliquis to 5mg  bid   2. HTN - he is at goal, continue current meds   3. Hyperlipidemia - request pcp labs, continue atorvastatin   F/u 6 months      Arnoldo Lenis, M.D.

## 2021-09-01 NOTE — Patient Instructions (Signed)

## 2021-12-07 ENCOUNTER — Telehealth (HOSPITAL_COMMUNITY): Payer: Self-pay

## 2021-12-07 NOTE — Telephone Encounter (Signed)
Returned pt's call, no answer, left vm. AW  

## 2022-03-15 ENCOUNTER — Other Ambulatory Visit (HOSPITAL_COMMUNITY): Payer: Self-pay | Admitting: Interventional Radiology

## 2022-03-15 ENCOUNTER — Telehealth (HOSPITAL_COMMUNITY): Payer: Self-pay

## 2022-03-15 DIAGNOSIS — I771 Stricture of artery: Secondary | ICD-10-CM

## 2022-03-15 NOTE — Telephone Encounter (Signed)
Called to give pt appt info, no answer, left vm. AW

## 2022-03-30 ENCOUNTER — Ambulatory Visit (HOSPITAL_COMMUNITY)
Admission: RE | Admit: 2022-03-30 | Discharge: 2022-03-30 | Disposition: A | Discharge status: Home / Self Care | Payer: Medicare HMO | Source: Ambulatory Visit | Attending: Interventional Radiology | Admitting: Interventional Radiology

## 2022-03-30 DIAGNOSIS — I771 Stricture of artery: Secondary | ICD-10-CM | POA: Diagnosis present

## 2022-03-30 DIAGNOSIS — I6501 Occlusion and stenosis of right vertebral artery: Secondary | ICD-10-CM | POA: Diagnosis not present

## 2022-03-30 IMAGING — MR MR MRA HEAD W/O CM
2 series · 19 of 48 positions shown · non-contrast
Comparison: Intracranial MRA [DATE] and earlier.

CLINICAL DATA: [AGE] male with distal right vertebral artery
stenosis, mild to moderate left ACA A2 stenosis.

EXAM:
MRA HEAD WITHOUT CONTRAST
TECHNIQUE: Angiographic images of the Circle of Willis were acquired using MRA
technique without intravenous contrast.

[Series 3: (id) mt fs · axial · 1.4mm · 0.43mm/px · z∈[-55,+30]mm · 11 of 136 slices shown]
[im 7/136]
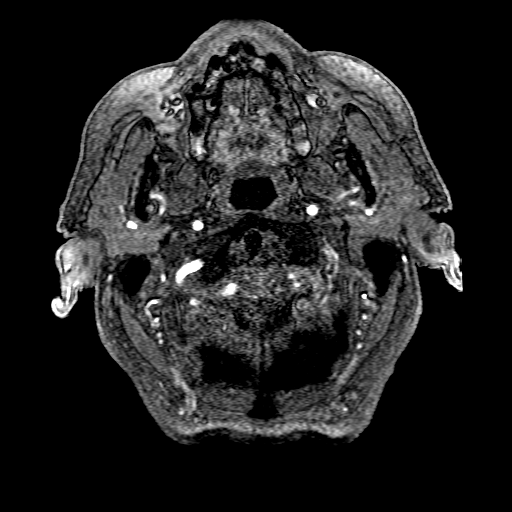
[im 21/136]
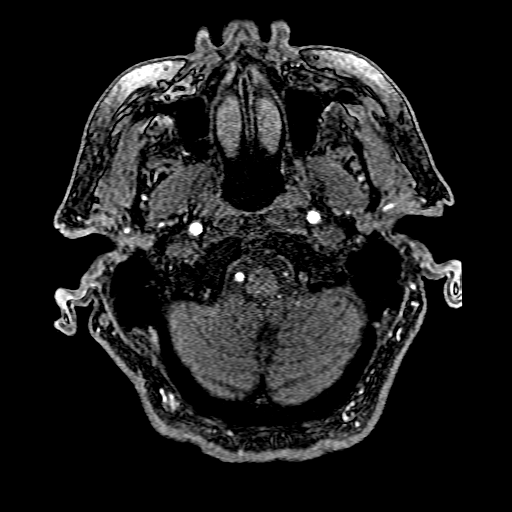
[im 25/136]
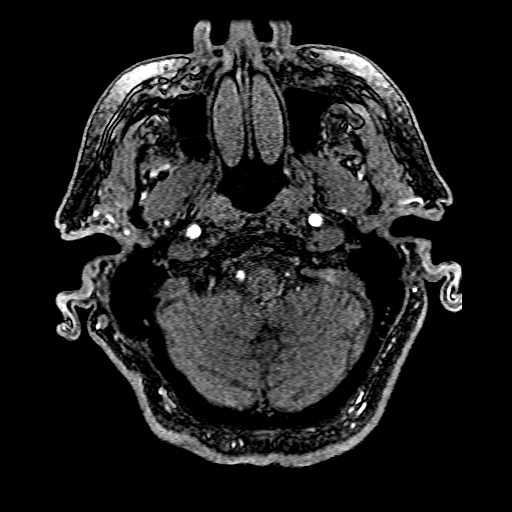
[im 42/136]
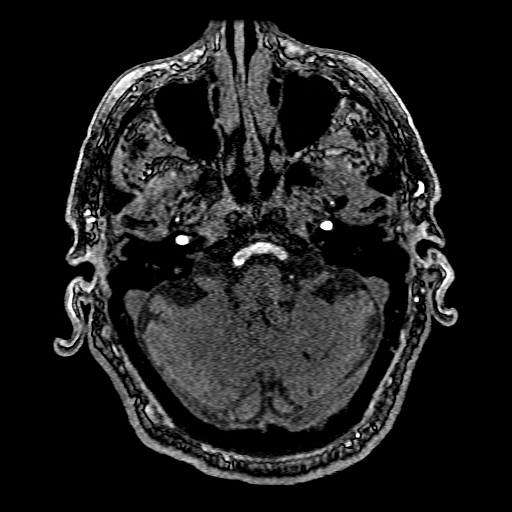
[im 59/136]
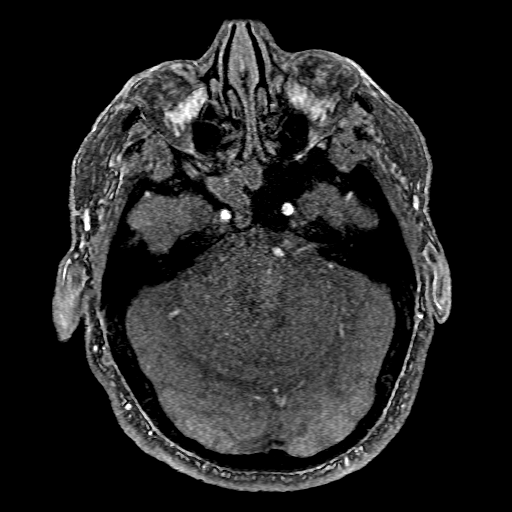
[im 70/136]
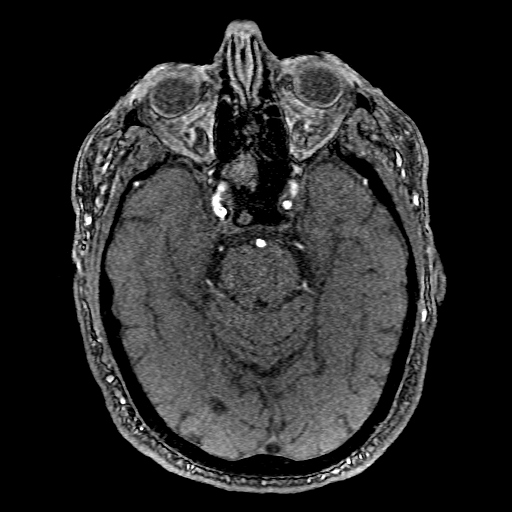
[im 77/136]
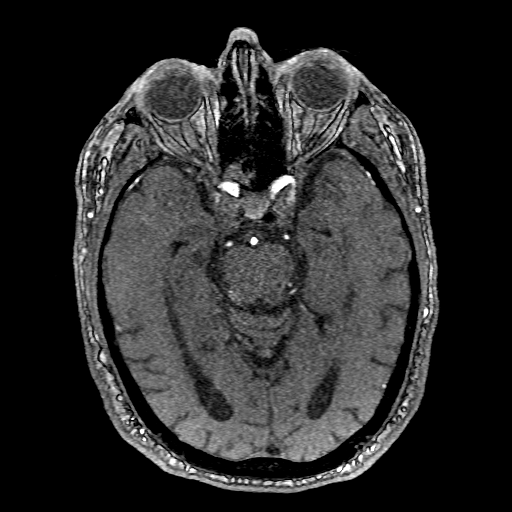
[im 94/136]
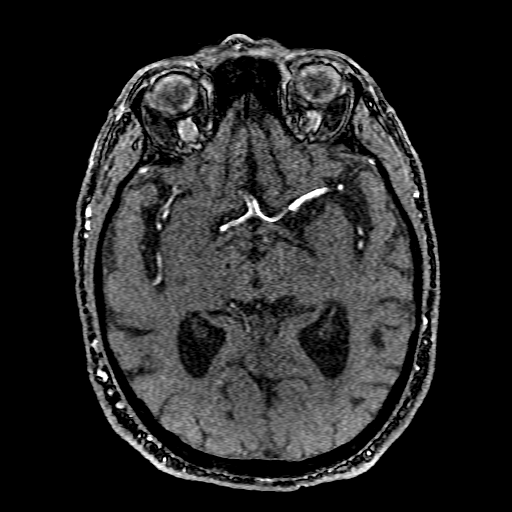
[im 111/136]
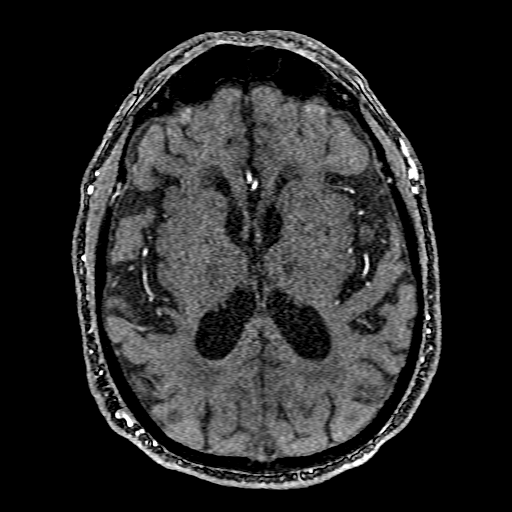
[im 115/136]
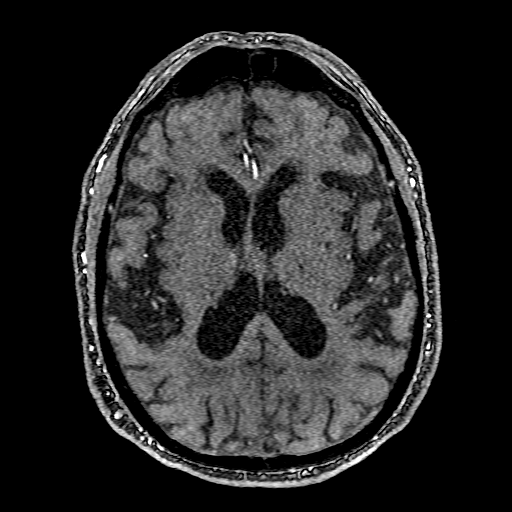
[im 129/136]
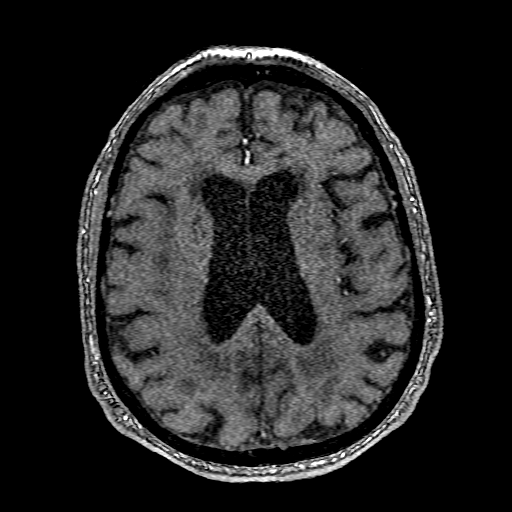

[Series 4: T1 · sagittal · 5.0mm · 0.47mm/px · 8 of 29 slices shown]
[im 1/29]
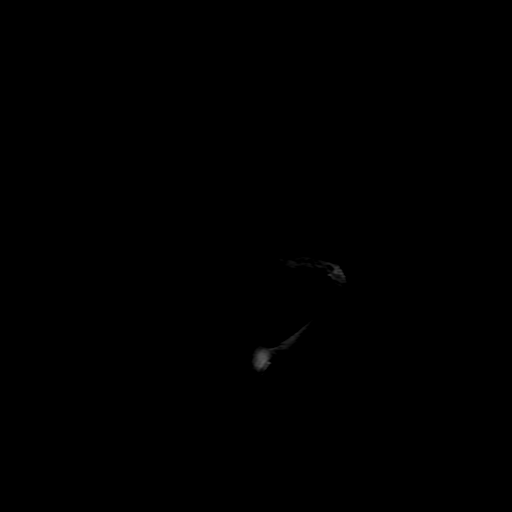
[im 5/29]
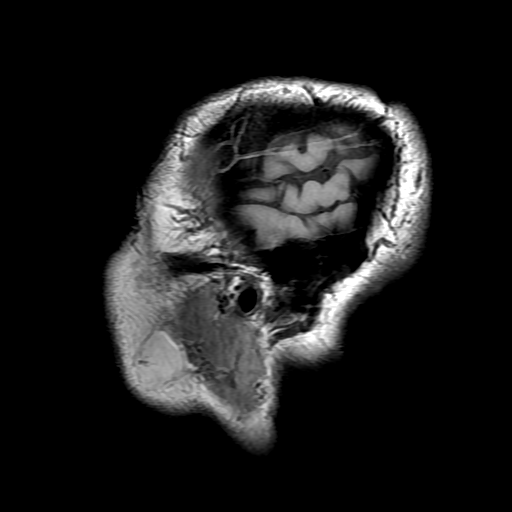
[im 9/29]
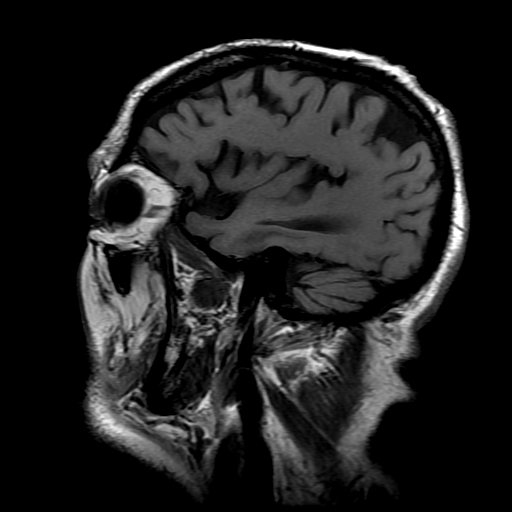
[im 13/29]
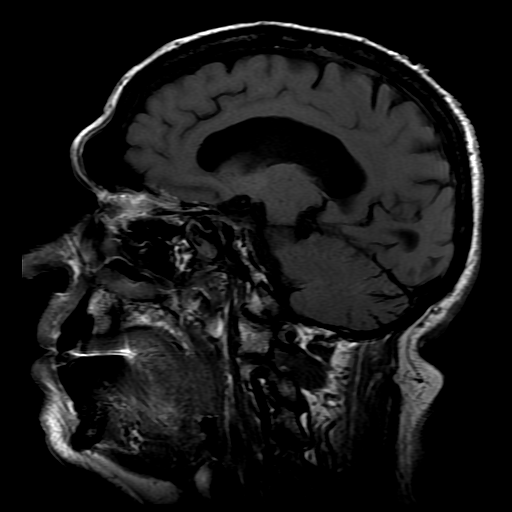
[im 17/29]
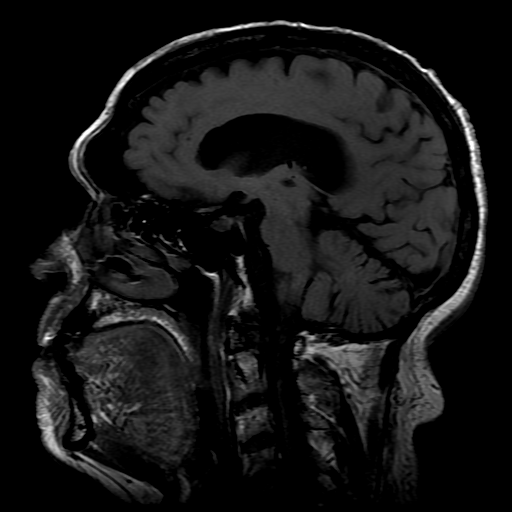
[im 21/29]
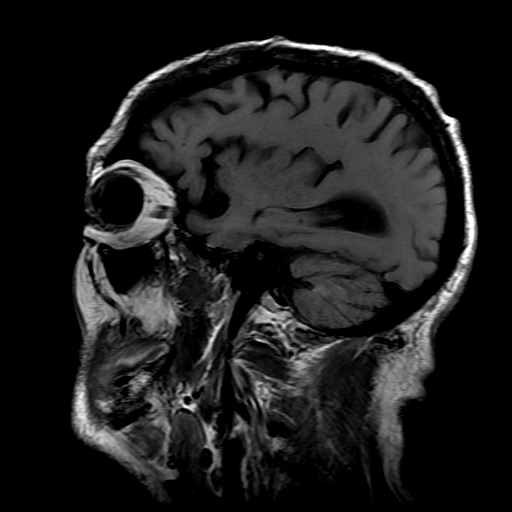
[im 25/29]
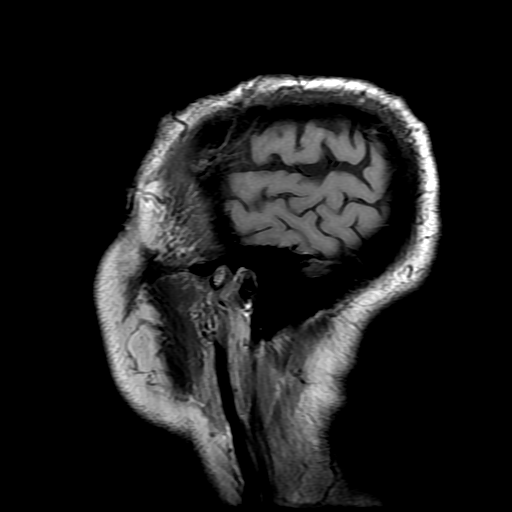
[im 29/29]
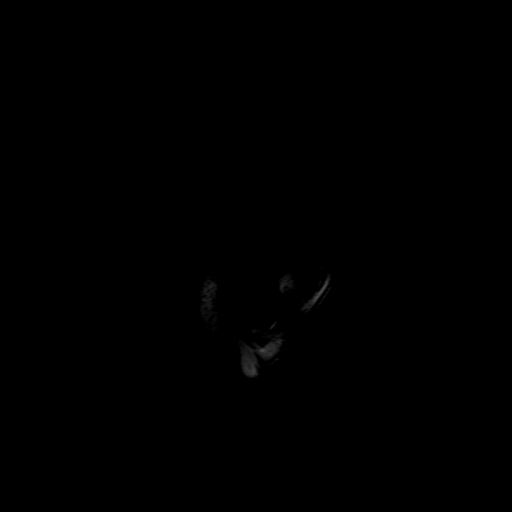

[19 of 48 positions shown; findings below may reference images not displayed]

FINDINGS: Anterior circulation: Stable antegrade flow in the anterior
circulation. Both ICA siphons are patent with evidence of chronic
atherosclerosis. No significant siphon stenosis. Patent carotid
termini, MCA and ACA origins without stenosis. Normal anterior
communicating artery. Mild to moderate left ACA A2 segment stenosis
on series 3, image 108 appears stable. MCA M1 segments and
bifurcations appear patent without stenosis. Visible bilateral MCA
and ACA branches are stable.

Posterior circulation: Antegrade flow in the posterior circulation
appears stable since last year. Dominant distal right vertebral
artery with moderate to now possibly severe stenosis best
demonstrated on source images (series 3, image 32), does appear
somewhat progressed from last year. Distal right vertebral artery
and vertebrobasilar junction remain patent. Diminutive distal left
vertebral artery and left PICA origin appears stable and patent.
Patent basilar artery, left AICA, bilateral SCA, and PCA origins.
Both posterior communicating arteries are present. Bilateral PCA
branches are stable, within normal limits.

Anatomic variants: Dominant right vertebral artery.

Other: No evidence of intracranial mass effect or ventriculomegaly.
Evidence of chronic small vessel disease again noted.
IMPRESSION: 1. Chronic stenosis of the dominant distal right vertebral artery
does appear somewhat progressed by MRA since last year and is now
moderate to severe. Remaining posterior circulation is stable and
patent.

2. Stable anterior circulation atherosclerosis, with mild to
moderate left ACA A2 stenosis.

## 2022-04-05 ENCOUNTER — Telehealth (HOSPITAL_COMMUNITY): Payer: Self-pay

## 2022-04-05 NOTE — Telephone Encounter (Signed)
Called pt regarding recent imaging, no answer, left vm. AW  

## 2022-04-06 ENCOUNTER — Telehealth (HOSPITAL_COMMUNITY): Payer: Self-pay

## 2022-04-06 NOTE — Telephone Encounter (Signed)
Left vm for pt to f/u in 1 year with an mra. AW

## 2022-04-07 ENCOUNTER — Telehealth (HOSPITAL_COMMUNITY): Payer: Self-pay | Admitting: Radiology

## 2022-04-07 NOTE — Telephone Encounter (Signed)
Returned pt's call. Left VM for him to f/u with MRA in 1 yrs time. JM

## 2022-07-12 NOTE — Progress Notes (Addendum)
Cardiology Office Note   Date:  07/13/2022   ID:  Scott [name], DOB 02/17/1928, MRN [id number]  PCP:  Curlene Labrum, MD  Cardiologist:  Dr. Harl Bowie     Chief Complaint  Patient presents with   Atrial Fibrillation      History of Present Illness: Scott [name] is a 88 y.o. male who presents for atrial fib, HTN, HLD   Pt with hx a fib with [place] 2019 and then back into a Fib, now prefers rate control.  Was not interested in antiarrythmic.  He is on eliquis.  May need higher dose depending on Cr.   Last echo 11/2017 and was stable.  His PCP follows lipids.  Neg nuc study 07/2015.   Today he feels well.  No chest pain, he still mows his grass with riding lawn mower. No trim work anymore. He tries to eat healthy.  His wife of 6 yrs died 2 yrs ago so he is adapting to being alone.  He does visit his daughter 3 times per week for dinner,  talks daily on phone with friends. No SOB, occ feels his a fib but has not needed extra dilt.  He has had syncope a year or so ago but none since.  We discussed if fast heart rates he should call us and if lightheaded or syncope to ER or call us.      Past Medical History:  Diagnosis Date   Anxiety 10/31/2014   due to upcoming procedure   Arrhythmia    Arthritis    Cancer ([place])    Melonoma on nose- removed   Chronic kidney disease    Dyspnea    Dysrhythmia    afib   Enlarged prostate    GERD (gastroesophageal reflux disease)    Headache    Hyperlipidemia    Hypertension    Hypothyroidism    Overactive bladder    Stroke ([place])    TIA - "nothing taste right anymore, right eye pain   TIA (transient ischemic attack)    Wears glasses     Past Surgical History:  Procedure Laterality Date   BACK SURGERY  08/2013   x2   CARDIOVERSION N/A 01/31/2018   Procedure: CARDIOVERSION;  Surgeon: Arnoldo Lenis, MD;  [facility];  Service: Endoscopy;  Laterality: N/A;   CATARACT EXTRACTION     COLONOSCOPY     EYE SURGERY  Bilateral    cataract   IR ANGIO INTRA EXTRACRAN SEL COM CAROTID INNOMINATE BILAT MOD SED  08/04/2017   IR ANGIO VERTEBRAL SEL VERTEBRAL BILAT MOD SED  08/04/2017   IR ANGIO VERTEBRAL SEL VERTEBRAL UNI R MOD SED  11/01/2017   IR RADIOLOGIST EVAL & MGMT  07/28/2017   IR RADIOLOGIST EVAL & MGMT  08/22/2017   IR RADIOLOGIST EVAL & MGMT  11/29/2017   JOINT REPLACEMENT Left 10/2015   hip   KNEE ARTHROPLASTY Left 1999   KNEE ARTHROPLASTY Right 2008   LUMBAR FUSION  06/2014   RADIOLOGY WITH ANESTHESIA N/A 11/01/2017   Procedure: STENTING;  Surgeon: Luanne Bras, MD;  Location: [facility];  Service: Radiology;  Laterality: N/A;   TRANSURETHRAL RESECTION OF PROSTATE  03/2015   TRIGGER FINGER RELEASE Right    3rd and 4th     Current Outpatient Medications  Medication Sig Dispense Refill   acetaminophen (TYLENOL) 500 MG tablet Take 1,000 mg by mouth 2 (two) times daily.     apixaban (ELIQUIS) 2.5 MG TABS tablet  Take 1 tablet (2.5 mg total) by mouth 2 (two) times daily. 60 tablet 6   aspirin EC 81 MG tablet Take 81 mg by mouth at bedtime.      atorvastatin (LIPITOR) 10 MG tablet Take 10 mg by mouth every evening.      diltiazem (CARDIZEM) 60 MG tablet Take 1 tablet (60 mg total) by mouth 2 (two) times daily. (May take an extra '30mg'$  in the evening as needed)  Med increased 10/26/2017.   PLACE ON FILE 75 tablet 6   doxazosin (CARDURA) 8 MG tablet Take 8 mg by mouth at bedtime.     finasteride (PROSCAR) 5 MG tablet Take 5 mg by mouth daily.     fluticasone (FLONASE) 50 MCG/ACT nasal spray Place 2 sprays into both nostrils daily as needed for allergies or rhinitis.      labetalol (NORMODYNE) 200 MG tablet Take 200 mg by mouth 2 (two) times daily.     levothyroxine (SYNTHROID, LEVOTHROID) 150 MCG tablet Take 150 mcg daily before breakfast by mouth.     lisinopril (PRINIVIL,ZESTRIL) 40 MG tablet Take 40 mg by mouth daily.     Multiple Vitamins-Minerals (PRESERVISION AREDS 2) CAPS Take 1 tablet by mouth  daily as needed.     omeprazole (PRILOSEC) 20 MG capsule Take 20 mg by mouth daily.     oxyCODONE-acetaminophen (PERCOCET) 10-325 MG tablet Take 1 tablet by mouth 2 (two) times daily as needed.     trolamine salicylate (ASPERCREME) 10 % cream Apply 1 application topically as needed for muscle pain.     No current facility-administered medications for this visit.   Facility-Administered Medications Ordered in Other Visits  Medication Dose Route Frequency Provider Last Rate Last Admin   clopidogrel (PLAVIX) tablet 75 mg  75 mg Oral 60 min Pre-Op Monia Sabal, PA-C        Allergies:   Tramadol    Social History:  The patient  reports that he quit smoking about 43 years ago. His smoking use included cigarettes. He started smoking about 56 years ago. He has never used smokeless tobacco. He reports that he does not drink alcohol and does not use drugs.   Family History:  The patient's family history includes Coronary artery disease in his mother.    ROS:  General:no colds or fevers, no weight changes Skin:no rashes or ulcers HEENT:no blurred vision, no congestion CV:see HPI PUL:see HPI GI:no diarrhea constipation or melena, no indigestion GU:no hematuria, no dysuria MS:no joint pain, no claudication Neuro:no syncope, no lightheadedness Endo:no diabetes, no thyroid disease  Wt Readings from Last 3 Encounters:  07/13/22 162 lb 12.8 oz (73.8 kg)  09/01/21 168 lb 6.4 oz (76.4 kg)  07/19/21 160 lb (72.6 kg)     PHYSICAL EXAM: VS:  BP 130/66   Pulse 62   Ht '5\' 6"'$  (1.676 m)   Wt 162 lb 12.8 oz (73.8 kg)   SpO2 96%   BMI 26.28 kg/m  , BMI Body mass index is 26.28 kg/m. General:Pleasant affect, NAD Skin:Warm and dry, brisk capillary refill HEENT:normocephalic, sclera clear, mucus membranes moist Neck:supple, no JVD, no bruits  Heart:irreg irreg without murmur, gallup, rub or click Lungs:clear without rales, rhonchi, or wheezes VOH:YWVP, non tender, + BS, do not palpate liver  spleen or masses Ext:no lower ext edema, 2+ pedal pulses, 2+ radial pulses Neuro:alert and oriented X 3, MAE, follows commands, + facial symmetry    EKG:  EKG is ordered today. The ekg ordered today demonstrates Atrial  fib with occ PVC  no acute changes from prior.   Recent Labs: No results found for requested labs within last 365 days.  Labs per PCP Na 140, K+ 4.1 BUN 15 Cr 1.41 Tsh 4.413 Vit D 33.5    Lipid Panel No results found for: "CHOL", "TRIG", "HDL", "CHOLHDL", "VLDL", "LDLCALC", "LDLDIRECT"     Other studies Reviewed: Additional studies/ records that were reviewed today include: . Echo 11/30/17 Study Conclusions   - Left ventricle: The cavity size was normal. Wall thickness was    increased increased in a pattern of mild to moderate LVH.    Systolic function was normal. The estimated ejection fraction was    in the range of 55% to 60%. Wall motion was normal; there were no    regional wall motion abnormalities. The study is not technically    sufficient to allow evaluation of LV diastolic function in the    setting of atrial fibrillation/flutter.  - Aortic valve: Mildly calcified annulus. Trileaflet.  - Mitral valve: Mildly calcified annulus. There was trivial    regurgitation.  - Left atrium: The atrium was mildly dilated.  - Right atrium: The atrium was mildly dilated. Central venous    pressure (est): 3 mm Hg.  - Atrial septum: No defect or patent foramen ovale was identified.  - Tricuspid valve: There was mild regurgitation.  - Pulmonary arteries: PA peak pressure: 25 mm Hg (S).  - Pericardium, extracardiac: There was no pericardial effusion.   -------------------------------------------------------------------  Study data:  The previous study was not available, so comparison  was made to the report of 06/22/2017. Previous Echo performed at  [facility] showed normal LV systolic and diastolic function, LV  EF 60-65%, moderate LVH, mild LAE.  Study  status:  Routine.  Procedure:  Transthoracic echocardiography. Image quality was  adequate.  Study completion:  There were no complications.  Transthoracic echocardiography.  M-mode, complete 2D, spectral  Doppler, and color Doppler.  Birthdate:  Patient birthdate:  27-Jul-1928.  Age:  Patient is 88 yr old.  Sex:  Gender: male.  BMI: 29.5 kg/m^2.  Blood pressure:     156/78  Patient status:  Outpatient.  Study date:  Study date: 11/30/2017. Study time: 12:33  PM.   -------------------------------------------------------------------   -------------------------------------------------------------------  Left ventricle:  The cavity size was normal. Wall thickness was  increased increased in a pattern of mild to moderate LVH. Systolic  function was normal. The estimated ejection fraction was in the  range of 55% to 60%. Wall motion was normal; there were no regional  wall motion abnormalities. The study is not technically sufficient  to allow evaluation of LV diastolic function in the setting of  atrial fibrillation/flutter.   ASSESSMENT AND PLAN:  1.  Atrial fib, rate controlled with rare PVC.  K+ is stable at 4.1 - pt is for rate controlled, he preferred to stay in a fib.  Continue dilt  follow up in 6 months with Dr. Harl Bowie.   2.  Anticoagulation stable on  eliquis 2.5 BID Cr 1.45 will ask pharmacy to review may need the 5 mg.  For syncope or falls he is aware to go to ER for eval.   Addendum 07/19/22 discussed his age and Cr with pharmacy and would be beneficial to increase eliquis to 5 mg BID.  His Cr has been stable for 6 months.   Out office will contact pt.   3.  HTN stable continue meds  4. HLD followed by PCP  and on statin. Continue  5. Prior CVA. Uses cane to walk   6.  Vertebrobasilar artery stenosis: He has known right-sided stenosis and this is followed by Dr. Estanislado Pandy. He is on ASA '81mg'$  daily   Current medicines are reviewed with the patient today.  The patient Has no  concerns regarding medicines.  The following changes have been made:  See above Labs/ tests ordered today include:see above  Disposition:   FU:  see above  Signed, Cecilie Kicks, NP  07/13/2022 4:24 PM    [address], [address] [place], [place] Phone: [phone]; Fax: [phone]

## 2022-07-13 ENCOUNTER — Encounter: Payer: Self-pay | Admitting: Cardiology

## 2022-07-13 ENCOUNTER — Ambulatory Visit: Payer: Medicare HMO | Attending: Cardiology | Admitting: Cardiology

## 2022-07-13 VITALS — BP 130/66 | HR 62 | Ht 66.0 in | Wt 162.8 lb

## 2022-07-13 DIAGNOSIS — I10 Essential (primary) hypertension: Secondary | ICD-10-CM

## 2022-07-13 DIAGNOSIS — I639 Cerebral infarction, unspecified: Secondary | ICD-10-CM

## 2022-07-13 DIAGNOSIS — Z7901 Long term (current) use of anticoagulants: Secondary | ICD-10-CM

## 2022-07-13 DIAGNOSIS — I4821 Permanent atrial fibrillation: Secondary | ICD-10-CM

## 2022-07-13 DIAGNOSIS — I779 Disorder of arteries and arterioles, unspecified: Secondary | ICD-10-CM

## 2022-07-13 NOTE — Patient Instructions (Signed)
Medication Instructions:  Your physician recommends that you continue on your current medications as directed. Please refer to the Current Medication list given to you today.  *If you need a refill on your cardiac medications before your next appointment, please call your pharmacy*   Lab Work: NONE   If you have labs (blood work) drawn today and your tests are completely normal, you will receive your results only by: MyChart Message (if you have MyChart) OR A paper copy in the mail If you have any lab test that is abnormal or we need to change your treatment, we will call you to review the results.   Testing/Procedures: NONE    Follow-Up: At [facility], you and your health needs are our priority.  As part of our continuing mission to provide you with exceptional heart care, we have created designated Provider Care Teams.  These Care Teams include your primary Cardiologist (physician) and Advanced Practice Providers (APPs -  Physician Assistants and Nurse Practitioners) who all work together to provide you with the care you need, when you need it.  We recommend signing up for the patient portal called "MyChart".  Sign up information is provided on this After Visit Summary.  MyChart is used to connect with patients for Virtual Visits (Telemedicine).  Patients are able to view lab/test results, encounter notes, upcoming appointments, etc.  Non-urgent messages can be sent to your provider as well.   To learn more about what you can do with MyChart, go to [email or web address].    Your next appointment:   6 month(s)  The format for your next appointment:   In Person  Provider:   Jonathan Branch, MD    Other Instructions Thank you for choosing [facility]!    Important Information About [place]       

## 2022-07-19 ENCOUNTER — Telehealth: Payer: Self-pay | Admitting: *Deleted

## 2022-07-19 NOTE — Telephone Encounter (Signed)
-----   Message from Isaiah Serge, NP sent at 07/19/2022  2:49 PM EDT ----- So I talked to pharmacy about his eliquis, he is currently at 2.5 but his kidney function is improved so we should increase his eliquis to 5 mg BID.  If you can let pt know   It would be safer to prevent a clot from forming.   If he can begin with tonights dose that would be great.  I will add addendum to my office note.  Thanks.   ----- Message ----- From: Ramond Dial, RPH-CPP Sent: 07/19/2022   7:07 AM EDT To: Isaiah Serge, NP  Hi scr has been pretty consistently <1.5 for the last 6 months or so. I would increase to '5mg'$ , especially given his hx of CVA. ----- Message ----- From: Isaiah Serge, NP Sent: 07/13/2022   4:27 PM EDT To: Leeroy Bock, RPH-CPP  Pt is on 2.5 of eliquis, BID --should I keep him on that for 88 yr old with Cr of 1.41.?  Wt 73.8 Kg

## 2022-07-20 MED ORDER — APIXABAN 5 MG PO TABS: 5.0000 mg | ORAL_TABLET | Freq: Two times a day (BID) | ORAL | 6 refills | Status: DC

## 2022-10-25 ENCOUNTER — Telehealth: Payer: Self-pay | Admitting: Cardiology

## 2022-10-25 NOTE — Telephone Encounter (Signed)
PCP had ordered test and was done at [facility], typically would be up to the ordering provider to go over the results. Looks like he is due for f/u, can get a PA appointment please. Can sort out why monitor was ordered and the context of the results at that time.   Zandra Abts MD

## 2022-10-25 NOTE — Telephone Encounter (Signed)
Pt stated Dr. Pleas Koch ordered monitor and sent our office results.   Please advise.

## 2022-10-25 NOTE — Telephone Encounter (Signed)
Pt calling to f/u on Heart Monitor results. He states that he never received a call with results. Please advise

## 2022-10-25 NOTE — Telephone Encounter (Signed)
Attempted to call pt- mailbox full

## 2022-10-26 NOTE — Progress Notes (Unsigned)
Cardiology Clinic Note   Patient Name: Scott [name] Date of Encounter: 10/27/2022  Primary Care Provider:  Curlene Labrum, MD Primary Cardiologist:  Carlyle Dolly, MD  Patient Profile    89 year old male with a past medical history of essential hypertension, hyperlipidemia, paroxysmal atrial fibrillation, carotid artery disease, previous CVA, gastroesophageal reflux disease, hypothyroidism, and chronic kidney disease who presents today for follow-up.  Past Medical History    Past Medical History:  Diagnosis Date   Anxiety 10/31/2014   due to upcoming procedure   Arrhythmia    Arthritis    Cancer ([place])    Melonoma on nose- removed   Chronic kidney disease    Dyspnea    Dysrhythmia    afib   Enlarged prostate    GERD (gastroesophageal reflux disease)    Headache    Hyperlipidemia    Hypertension    Hypothyroidism    Overactive bladder    Stroke ([place])    TIA - "nothing taste right anymore, right eye pain   TIA (transient ischemic attack)    Wears glasses    Past Surgical History:  Procedure Laterality Date   BACK SURGERY  08/2013   x2   CARDIOVERSION N/A 01/31/2018   Procedure: CARDIOVERSION;  Surgeon: Arnoldo Lenis, MD;  [facility];  Service: Endoscopy;  Laterality: N/A;   CATARACT EXTRACTION     COLONOSCOPY     EYE SURGERY Bilateral    cataract   IR ANGIO INTRA EXTRACRAN SEL COM CAROTID INNOMINATE BILAT MOD SED  08/04/2017   IR ANGIO VERTEBRAL SEL VERTEBRAL BILAT MOD SED  08/04/2017   IR ANGIO VERTEBRAL SEL VERTEBRAL UNI R MOD SED  11/01/2017   IR RADIOLOGIST EVAL & MGMT  07/28/2017   IR RADIOLOGIST EVAL & MGMT  08/22/2017   IR RADIOLOGIST EVAL & MGMT  11/29/2017   JOINT REPLACEMENT Left 10/2015   hip   KNEE ARTHROPLASTY Left 1999   KNEE ARTHROPLASTY Right 2008   LUMBAR FUSION  06/2014   RADIOLOGY WITH ANESTHESIA N/A 11/01/2017   Procedure: STENTING;  Surgeon: Luanne Bras, MD;  Location: [facility];  Service: Radiology;   Laterality: N/A;   TRANSURETHRAL RESECTION OF PROSTATE  03/2015   TRIGGER FINGER RELEASE Right    3rd and 4th    Allergies  Allergies  Allergen Reactions   Tramadol Nausea Only    History of Present Illness    Scott [name] is a 89 year old male with previously mentioned past medical history of persistent atrial fibrillation on chronic anticoagulation with apixaban, hypertension, hyperlipidemia, stage III CKD, and prior TIA, who presents today for follow-up after having an outside echocardiogram and ZIO XT monitor completed for complaints of dizziness.  He was originally seen in 12/2017 reported episodes of continued exertional dyspnea and fatigue.  His symptoms were thought to be related to his atrial fibrillation it was recommended to pursue a cardioversion following 1 month of anticoagulation.  Cardioversion was performed on 01/31/2018 and he was successfully cardioverted to normal sinus rhythm in the synchronized shock at 200 J.  On return after his procedure he reported feeling significantly improved.  But his EKG demonstrated atrial fibrillation that was rate controlled. He had an echocardiogram that was completed in 2019 which revealed an LVEF of 55-60%, no wall motion abnormality, and trivial mitral regurgitation and mild tricuspid regurgitation.  His most recent echocardiogram was done 08/31/2022 which revealed an LVEF of 65-70%, mitral annular calcification, mild mitral regurgitation, moderately dilated  left atrium, moderately dilated right atrium.   He was last seen in clinic on 07/13/2022 following up on his atrial fibrillation.  At that time he felt well without complaint.  He was continued on his current medication regimen and no further testing was ordered.  Did verify the dose of apixaban with pharmacy considering his age and creatinine clearance the pharmacy advised that he be increased from 2.5 mg twice daily to 5 mg twice daily as kidney function had remained stable.  He was  evaluated by his PCP with complaints of dizziness and questionable hypotensive episodes were at the time PCP decided to place him on a ZIO XT monitor for 14 days.  He did wear the monitor and the monitor was sent back to [facility] where he was awaiting the results.  The monitor was reviewed which showed atrial fibrillation with overall good rate control and occasional PVCs.  No prolonged pauses were present.  He did have 3 ventricular tachycardia runs that occurred the longest and fastest interval lasting 5 beats with maximum 162 bpm, there also was occasional ventricular bigeminy and trigeminy that were present even though the patient was asymptomatic.  He returns to clinic today stating that overall but he has felt well.  When questioned about symptoms he was having the negated him wearing the monitor he stated the hypotensive and dizzy episodes that he had been feeling.  He stated that he has had the same episodes for approximately 5 years off and on.  Since he has 1 the monitor he has had no recurrent episodes.  Today he denies chest pain, shortness of breath, palpitations, syncope/near syncope, or worsened dizziness.  Home Medications    Current Outpatient Medications  Medication Sig Dispense Refill   acetaminophen (TYLENOL) 500 MG tablet Take 1,000 mg by mouth 2 (two) times daily.     apixaban (ELIQUIS) 5 MG TABS tablet Take 1 tablet (5 mg total) by mouth 2 (two) times daily. 60 tablet 6   aspirin EC 81 MG tablet Take 81 mg by mouth at bedtime.      atorvastatin (LIPITOR) 10 MG tablet Take 10 mg by mouth every evening.      diltiazem (CARDIZEM) 60 MG tablet Take 1 tablet (60 mg total) by mouth 2 (two) times daily. (May take an extra '30mg'$  in the evening as needed)  Med increased 10/26/2017.   PLACE ON FILE 75 tablet 6   doxazosin (CARDURA) 8 MG tablet Take 8 mg by mouth at bedtime.     finasteride (PROSCAR) 5 MG tablet Take 5 mg by mouth daily.     fluticasone (FLONASE) 50 MCG/ACT nasal  spray Place 2 sprays into both nostrils daily as needed for allergies or rhinitis.      labetalol (NORMODYNE) 100 MG tablet Take 100 mg by mouth 2 (two) times daily.     levothyroxine (SYNTHROID, LEVOTHROID) 150 MCG tablet Take 150 mcg daily before breakfast by mouth.     lisinopril (ZESTRIL) 20 MG tablet Take 2 tablets by mouth daily.     Multiple Vitamins-Minerals (PRESERVISION AREDS 2) CAPS Take 1 tablet by mouth daily as needed.     omeprazole (PRILOSEC) 20 MG capsule Take 20 mg by mouth daily.     oxybutynin (DITROPAN) 5 MG tablet Take 1 tablet by mouth daily.     oxyCODONE-acetaminophen (PERCOCET) 10-325 MG tablet Take 1 tablet by mouth 2 (two) times daily as needed.     trolamine salicylate (ASPERCREME) 10 % cream Apply 1  application topically as needed for muscle pain.     No current facility-administered medications for this visit.   Facility-Administered Medications Ordered in Other Visits  Medication Dose Route Frequency Provider Last Rate Last Admin   clopidogrel (PLAVIX) tablet 75 mg  75 mg Oral 60 min Pre-Op Monia Sabal, PA-C         Family History    Family History  Problem Relation Age of Onset   Coronary artery disease Mother    He indicated that his mother is deceased. He indicated that his father is deceased.  Social History    Social History   Socioeconomic History   Marital status: Widowed    Spouse name: Not on file   Number of children: Not on file   Years of education: Not on file   Highest education level: Not on file  Occupational History   Not on file  Tobacco Use   Smoking status: Former    Years: 40.00    Types: Cigarettes    Start date: 10/17/1965    Quit date: 10/17/1978    Years since quitting: 44.0   Smokeless tobacco: Never  Vaping Use   Vaping Use: Never used  Substance and Sexual Activity   Alcohol use: No    Alcohol/week: 0.0 standard drinks of alcohol   Drug use: No   Sexual activity: Yes    Birth control/protection: None   Other Topics Concern   Not on file  Social History Narrative   Not on file   Social Determinants of Health   Financial Resource Strain: Not on file  Food Insecurity: Not on file  Transportation Needs: Not on file  Physical Activity: Not on file  Stress: Not on file  Social Connections: Not on file  Intimate Partner Violence: Not on file     Review of Systems    General:  No chills, fever, night sweats or weight changes.  Endorses fatigue Cardiovascular:  No chest pain, dyspnea on exertion, endorses occasional peripheral edema, orthopnea, endorses occasional palpitations, paroxysmal nocturnal dyspnea. Dermatological: No rash, lesions/masses Respiratory: No cough, dyspnea Urologic: No hematuria, dysuria Abdominal:   No nausea, vomiting, diarrhea, bright red blood per rectum, melena, or hematemesis Neurologic:  No visual changes, wkns, changes in mental status. All other systems reviewed and are otherwise negative except as noted above.     Physical Exam    VS:  BP 130/80   Pulse 71   Ht '5\' 6"'$  (1.676 m)   Wt 167 lb (75.8 kg)   SpO2 100%   BMI 26.95 kg/m  , BMI Body mass index is 26.95 kg/m.     GEN: Well nourished, well developed, in no acute distress.  Walking with a cane HEENT: normal. Neck: Supple, no JVD, carotid bruits, or masses. Cardiac: Irregularly irregular, no murmurs, rubs, or gallops. No clubbing, cyanosis, edema.  Radials 2+/PT 2+ and equal bilaterally.  Respiratory:  Respirations regular and unlabored, clear to auscultation bilaterally. GI: Soft, nontender, nondistended, BS + x 4. MS: no deformity or atrophy. Skin: warm and dry, no rash. Neuro:  Strength and sensation are intact. Psych: Normal affect.  Accessory Clinical Findings    ECG personally reviewed by me today-no new tracings repeated today  Lab Results  Component Value Date   WBC 5.6 01/25/2018   HGB 11.9 (L) 01/25/2018   HCT 37.7 (L) 01/25/2018   MCV 89.3 01/25/2018   PLT 203  01/25/2018   Lab Results  Component Value Date   CREATININE 1.40 (  H) 05/30/2019   BUN 27 (H) 01/25/2018   NA 138 01/25/2018   K 4.4 01/25/2018   CL 102 01/25/2018   CO2 23 01/25/2018   Lab Results  Component Value Date   ALT 14 (L) 11/01/2017   AST 19 11/01/2017   ALKPHOS 57 11/01/2017   BILITOT 1.0 11/01/2017   No results found for: "CHOL", "HDL", "LDLCALC", "LDLDIRECT", "TRIG", "CHOLHDL"  No results found for: "HGBA1C"  Assessment & Plan   1.  Persistent atrial fibrillation with occasional PVC.  Recent ZIO XT monitor with atrial fib and 100% burden with occasional PVCs without prolonged pauses.  He remains rate controlled today.  Denies any shortness of breath or palpitations related to irregular heart rhythm.  Previously had declined antiarrhythmics and repeat DCCV.  He is continued on apixaban 5 mg twice daily for CHA2DS2-VASc score of ..., diltiazem 60 mg twice daily with an additional 30 mg in the evening as needed for increased heart rate, labetalol 100 mg twice daily.  We also discussed triggers of palpitations and elevated heart rates being fatigue, stress, caffeine, anemia, and the most common of dehydration.  2.  Hypertension with blood pressure today 130/80 which remains stable.  He states he has not had any recent episodes of hypotension.  We have encouraged him to maintain adequate hydration as he states that he is very well aware that he does not take enough fluid intake in during the day.  We advised symptoms can often's cause dizziness as well.  He is continued on diltiazem, labetalol, and lisinopril.  He requires no refills of these medications today.  3.  Hyperlipidemia that is followed by his PCP.  He has been maintained on atorvastatin 10 mg daily.  4.  Prior CVA with residual deficits requiring ambulation with cane.  Is continued on aspirin 81 mg daily.  5.  Vertebral basilar artery stenosis with known right-sided stenosis that is followed by vascular and  interventional radiology.  He is continued on aspirin 81 mg daily and atorvastatin 10 mg daily  6.  Disposition patient return to clinic to see primary cardiologist in 3 months or sooner if needed  Eulalio Reamy, NP 10/27/2022, 4:24 PM

## 2022-10-26 NOTE — Telephone Encounter (Signed)
Spoke to patient who verbalized understanding. Patient has appt with Gerrie Nordmann, NP on 1/11 @ 3pm to discuss monitor.

## 2022-10-27 ENCOUNTER — Encounter: Payer: Self-pay | Admitting: Cardiology

## 2022-10-27 ENCOUNTER — Ambulatory Visit: Payer: Medicare HMO | Attending: Cardiology | Admitting: Cardiology

## 2022-10-27 VITALS — BP 130/80 | HR 71 | Ht 66.0 in | Wt 167.0 lb

## 2022-10-27 DIAGNOSIS — I10 Essential (primary) hypertension: Secondary | ICD-10-CM

## 2022-10-27 DIAGNOSIS — E782 Mixed hyperlipidemia: Secondary | ICD-10-CM

## 2022-10-27 DIAGNOSIS — I639 Cerebral infarction, unspecified: Secondary | ICD-10-CM

## 2022-10-27 DIAGNOSIS — I779 Disorder of arteries and arterioles, unspecified: Secondary | ICD-10-CM | POA: Diagnosis not present

## 2022-10-27 DIAGNOSIS — I4821 Permanent atrial fibrillation: Secondary | ICD-10-CM

## 2022-10-27 NOTE — Patient Instructions (Signed)
Medication Instructions:  Your physician recommends that you continue on your current medications as directed. Please refer to the Current Medication list given to you today.  *If you need a refill on your cardiac medications before your next appointment, please call your pharmacy*   Lab Work: NONE   If you have labs (blood work) drawn today and your tests are completely normal, you will receive your results only by: [place] (if you have MyChart) OR A paper copy in the mail If you have any lab test that is abnormal or we need to change your treatment, we will call you to review the results.   Testing/Procedures: NONE    Follow-Up: At [facility], you and your health needs are our priority.  As part of our continuing mission to provide you with exceptional heart care, we have created designated Provider Care Teams.  These Care Teams include your primary Cardiologist (physician) and Advanced Practice Providers (APPs -  Physician Assistants and Nurse Practitioners) who all work together to provide you with the care you need, when you need it.  We recommend signing up for the patient portal called "MyChart".  Sign up information is provided on this After Visit Summary.  MyChart is used to connect with patients for Virtual Visits (Telemedicine).  Patients are able to view lab/test results, encounter notes, upcoming appointments, etc.  Non-urgent messages can be sent to your provider as well.   To learn more about what you can do with MyChart, go to [email or web address].    Your next appointment:   3 month(s)  Provider:   Carlyle Dolly, MD    Other Instructions Thank you for choosing [facility]!

## 2023-01-30 ENCOUNTER — Ambulatory Visit: Payer: Medicare HMO | Attending: Cardiology | Admitting: Cardiology

## 2023-01-30 ENCOUNTER — Encounter: Payer: Self-pay | Admitting: Cardiology

## 2023-01-30 VITALS — BP 140/76 | HR 98 | Ht 66.0 in | Wt 166.8 lb

## 2023-01-30 DIAGNOSIS — E782 Mixed hyperlipidemia: Secondary | ICD-10-CM | POA: Diagnosis not present

## 2023-01-30 DIAGNOSIS — I4891 Unspecified atrial fibrillation: Secondary | ICD-10-CM | POA: Diagnosis not present

## 2023-01-30 DIAGNOSIS — I10 Essential (primary) hypertension: Secondary | ICD-10-CM

## 2023-01-30 NOTE — Patient Instructions (Signed)
Medication Instructions:  Your physician recommends that you continue on your current medications as directed. Please refer to the Current Medication list given to you today.  *If you need a refill on your cardiac medications before your next appointment, please call your pharmacy*   Lab Work: None If you have labs (blood work) drawn today and your tests are completely normal, you will receive your results only by: MyChart Message (if you have MyChart) OR A paper copy in the mail If you have any lab test that is abnormal or we need to change your treatment, we will call you to review the results.   Testing/Procedures: None   Follow-Up: At [facility], you and your health needs are our priority.  As part of our continuing mission to provide you with exceptional heart care, we have created designated Provider Care Teams.  These Care Teams include your primary Cardiologist (physician) and Advanced Practice Providers (APPs -  Physician Assistants and Nurse Practitioners) who all work together to provide you with the care you need, when you need it.  We recommend signing up for the patient portal called "MyChart".  Sign up information is provided on this After Visit Summary.  MyChart is used to connect with patients for Virtual Visits (Telemedicine).  Patients are able to view lab/test results, encounter notes, upcoming appointments, etc.  Non-urgent messages can be sent to your provider as well.   To learn more about what you can do with MyChart, go to [email or web address].    Your next appointment:   6 month(s)  Provider:   Jonathan Branch, MD    Other Instructions    

## 2023-01-30 NOTE — Progress Notes (Signed)
Clinical Summary Scott [name] is a 89 y.o.male seen today for follow up of the following medical problems.      1. Afib  He underwent direct-current cardioversion in 2019 and subsequently went back into atrial fibrillation.  At that time  discussion was held with him about initiating antiarrhythmic therapy and the possibility of repeat direct-current cardioversion but the patient preferred a rate control strategy.    - infrequent palpitaitons, mild - - compliant with meds - no bleeding on eliquis. Has been on 2.5mg  bid, previosuly Cr was above 1.5 though most recently below. Wt is 76 kg.     08/2022 monitor [facility]: 100% afib burden, HRs 37-165 avg 75. Occas PVCs  -no recent palpitations - compliant with meds. No bleeding in on eliquis.   2. HTN - 130s-160s/70s, some dizziness at times with standing. REports somewhat limited hydration    3. Hyperlipidemia - pcp follows labs - he is on atorvastatin 03/2022 TC 125 TG 46 HDL 56 LDL 58     4. Prior CVA     5.  Vertebrobasilar artery stenosis: He has known right-sided stenosis and this is followed by Dr. Corliss Skains. He is on ASA 81mg  daily       SH: wife passed in Nov due to bladder cancer.  Past Medical History:  Diagnosis Date   Anxiety 10/31/2014   due to upcoming procedure   Arrhythmia    Arthritis    Cancer ([facility])    Melonoma on nose- removed   Chronic kidney disease    Dyspnea    Dysrhythmia    afib   Enlarged prostate    GERD (gastroesophageal reflux disease)    Headache    Hyperlipidemia    Hypertension    Hypothyroidism    Overactive bladder    Stroke ([facility])    TIA - "nothing taste right anymore, right eye pain   TIA (transient ischemic attack)    Wears glasses      Allergies  Allergen Reactions   Tramadol Nausea Only     Current Outpatient Medications  Medication Sig Dispense Refill   acetaminophen (TYLENOL) 500 MG tablet Take 1,000 mg by mouth 2 (two) times daily.     apixaban (ELIQUIS) 5  MG TABS tablet Take 1 tablet (5 mg total) by mouth 2 (two) times daily. 60 tablet 6   aspirin EC 81 MG tablet Take 81 mg by mouth at bedtime.      atorvastatin (LIPITOR) 10 MG tablet Take 10 mg by mouth every evening.      diltiazem (CARDIZEM) 60 MG tablet Take 1 tablet (60 mg total) by mouth 2 (two) times daily. (May take an extra 30mg  in the evening as needed)  Med increased 10/26/2017.   PLACE ON FILE 75 tablet 6   doxazosin (CARDURA) 8 MG tablet Take 8 mg by mouth at bedtime.     finasteride (PROSCAR) 5 MG tablet Take 5 mg by mouth daily.     fluticasone (FLONASE) 50 MCG/ACT nasal spray Place 2 sprays into both nostrils daily as needed for allergies or rhinitis.      labetalol (NORMODYNE) 100 MG tablet Take 100 mg by mouth 2 (two) times daily.     levothyroxine (SYNTHROID, LEVOTHROID) 150 MCG tablet Take 150 mcg daily before breakfast by mouth.     lisinopril (ZESTRIL) 20 MG tablet Take 2 tablets by mouth daily.     Multiple Vitamins-Minerals (PRESERVISION AREDS 2) CAPS Take 1 tablet by mouth daily  as needed.     omeprazole (PRILOSEC) 20 MG capsule Take 20 mg by mouth daily.     oxybutynin (DITROPAN) 5 MG tablet Take 1 tablet by mouth daily.     oxyCODONE-acetaminophen (PERCOCET) 10-325 MG tablet Take 1 tablet by mouth 2 (two) times daily as needed.     trolamine salicylate (ASPERCREME) 10 % cream Apply 1 application topically as needed for muscle pain.     No current facility-administered medications for this visit.   Facility-Administered Medications Ordered in Other Visits  Medication Dose Route Frequency Provider Last Rate Last Admin   clopidogrel (PLAVIX) tablet 75 mg  75 mg Oral 60 min Pre-Op Ralene Muskrat, PA-C         Past Surgical History:  Procedure Laterality Date   BACK SURGERY  08/2013   x2   CARDIOVERSION N/A 01/31/2018   Procedure: CARDIOVERSION;  Surgeon: Antoine Poche, MD;  [facility];  Service: Endoscopy;  Laterality: N/A;   CATARACT EXTRACTION      COLONOSCOPY     EYE SURGERY Bilateral    cataract   IR ANGIO INTRA EXTRACRAN SEL COM CAROTID INNOMINATE BILAT MOD SED  08/04/2017   IR ANGIO VERTEBRAL SEL VERTEBRAL BILAT MOD SED  08/04/2017   IR ANGIO VERTEBRAL SEL VERTEBRAL UNI R MOD SED  11/01/2017   IR RADIOLOGIST EVAL & MGMT  07/28/2017   IR RADIOLOGIST EVAL & MGMT  08/22/2017   IR RADIOLOGIST EVAL & MGMT  11/29/2017   JOINT REPLACEMENT Left 10/2015   hip   KNEE ARTHROPLASTY Left 1999   KNEE ARTHROPLASTY Right 2008   LUMBAR FUSION  06/2014   RADIOLOGY WITH ANESTHESIA N/A 11/01/2017   Procedure: STENTING;  Surgeon: Julieanne Cotton, MD;  Location: [facility];  Service: Radiology;  Laterality: N/A;   TRANSURETHRAL RESECTION OF PROSTATE  03/2015   TRIGGER FINGER RELEASE Right    3rd and 4th     Allergies  Allergen Reactions   Tramadol Nausea Only      Family History  Problem Relation Age of Onset   Coronary artery disease Mother      Social History Mr. Kole reports that he quit smoking about 44 years ago. His smoking use included cigarettes. He started smoking about 57 years ago. He has never used smokeless tobacco. Mr. Swartley reports no history of alcohol use.   Review of Systems CONSTITUTIONAL: No weight loss, fever, chills, weakness or fatigue.  HEENT: Eyes: No visual loss, blurred vision, double vision or yellow sclerae.No hearing loss, sneezing, congestion, runny nose or sore throat.  SKIN: No rash or itching.  CARDIOVASCULAR: per hpi RESPIRATORY: No shortness of breath, cough or sputum.  GASTROINTESTINAL: No anorexia, nausea, vomiting or diarrhea. No abdominal pain or blood.  GENITOURINARY: No burning on urination, no polyuria NEUROLOGICAL:per hpi MUSCULOSKELETAL: No muscle, back pain, joint pain or stiffness.  LYMPHATICS: No enlarged nodes. No history of splenectomy.  PSYCHIATRIC: No history of depression or anxiety.  ENDOCRINOLOGIC: No reports of sweating, cold or heat intolerance. No polyuria or  polydipsia.  Marland Kitchen   Physical Examination Today's Vitals   01/30/23 1448  BP: (!) 140/76  Pulse: 98  SpO2: 98%  Weight: 166 lb 12.8 oz (75.7 kg)  Height: 5\' 6"  (1.676 m)   Body mass index is 26.92 kg/m.  Gen: resting comfortably, no acute distress HEENT: no scleral icterus, pupils equal round and reactive, no palptable cervical adenopathy,  CV: irrweg, no m/rg, no jvd Resp: Clear to auscultation bilaterally GI: abdomen  is soft, non-tender, non-distended, normal bowel sounds, no hepatosplenomegaly MSK: extremities are warm, no edema.  Skin: warm, no rash Neuro:  no focal deficits Psych: appropriate affect   Diagnostic Studies  08/2022 monitor [facility]: 100% afib burden, HRs 37-165 avg 75. Occas PVCs   Assessment and Plan   1. Afib/acquired thrombophilia - no symptoms, continue current meds includig eliquis for stroke prevention   2. HTN - reasonable control given advanced age and orthostatic symptoms, continue current meds   3. Hyperlipidemia - at goal, continue current meds   F/u 6 months     Antoine Poche, M.D.

## 2023-04-10 ENCOUNTER — Other Ambulatory Visit (HOSPITAL_COMMUNITY): Payer: Self-pay | Admitting: Interventional Radiology

## 2023-04-10 DIAGNOSIS — I771 Stricture of artery: Secondary | ICD-10-CM

## 2023-04-24 ENCOUNTER — Ambulatory Visit (HOSPITAL_COMMUNITY)
Admission: RE | Admit: 2023-04-24 | Discharge: 2023-04-24 | Disposition: A | Discharge status: Home / Self Care | Payer: Medicare HMO | Source: Ambulatory Visit | Attending: Interventional Radiology | Admitting: Interventional Radiology

## 2023-04-24 DIAGNOSIS — I771 Stricture of artery: Secondary | ICD-10-CM | POA: Insufficient documentation

## 2023-04-24 DIAGNOSIS — G9389 Other specified disorders of brain: Secondary | ICD-10-CM | POA: Diagnosis not present

## 2023-05-01 ENCOUNTER — Encounter (HOSPITAL_COMMUNITY): Payer: Self-pay

## 2023-05-02 ENCOUNTER — Other Ambulatory Visit (HOSPITAL_COMMUNITY): Payer: Self-pay | Admitting: Interventional Radiology

## 2023-05-02 DIAGNOSIS — I771 Stricture of artery: Secondary | ICD-10-CM

## 2023-05-15 ENCOUNTER — Telehealth: Payer: Self-pay | Admitting: Cardiology

## 2023-05-15 ENCOUNTER — Ambulatory Visit (HOSPITAL_COMMUNITY)
Admission: RE | Admit: 2023-05-15 | Discharge: 2023-05-15 | Disposition: A | Discharge status: Home / Self Care | Payer: Medicare HMO | Source: Ambulatory Visit | Attending: Interventional Radiology | Admitting: Interventional Radiology

## 2023-05-15 DIAGNOSIS — I771 Stricture of artery: Secondary | ICD-10-CM

## 2023-05-15 NOTE — Telephone Encounter (Signed)
Pt c/o medication issue:  1. Name of Medication: apixaban (ELIQUIS) 5 MG TABS tablet   2. How are you currently taking this medication (dosage and times per day)? Take 1 tablet (5 mg total) by mouth 2 (two) times daily.   3. Are you having a reaction (difficulty breathing--STAT)? No  4. What is your medication issue? Patient's daughter called stating that the [place] changed his medication from 5mg  to 2.5 mg. Patient's wife stated that the patient start taking the 2.5 mg. Patient's daughter is concerned. Please advise.

## 2023-05-15 NOTE — Telephone Encounter (Signed)
Prescription refill request for Eliquis received. Indication: AF Last office visit: 01/30/23  Dominga Ferry MD Scr: 1.32 on 05/01/23  Epic Age: 89 Weight: 75.7kg  Based on above findings Eliquis 5mg  twice daily is the appropriate dose.  Returned daughter's call.  No answer.  LMOM for daughter to call back to discuss.   05/16/23  Pt's wife walked in office today.  Pt has not had any bleeding issues.  Will continue Eliquis 5mg  twice daily as recommended by PharmD.  Rx printed and given to wife to take to the [place].

## 2023-05-16 ENCOUNTER — Telehealth: Payer: Self-pay | Admitting: Cardiology

## 2023-05-16 HISTORY — PX: IR RADIOLOGIST EVAL & MGMT: IMG5224

## 2023-05-16 MED ORDER — APIXABAN 5 MG PO TABS: 5.0000 mg | ORAL_TABLET | Freq: Two times a day (BID) | ORAL | 5 refills | Status: AC

## 2023-05-16 MED ORDER — APIXABAN 5 MG PO TABS: 5.0000 mg | ORAL_TABLET | Freq: Two times a day (BID) | ORAL | 5 refills | Status: DC

## 2023-05-16 NOTE — Addendum Note (Signed)
Addended by: Louanna Raw on: 05/16/2023 01:08 PM   Modules accepted: Orders

## 2023-05-16 NOTE — Telephone Encounter (Signed)
Scott [name] is calling returning Scott [name]'s call regarding information request. She reports she needs to reach out to [place] who assists Dr. Corliss Skains and her contact number is [phone]. Please advise.

## 2023-05-16 NOTE — Telephone Encounter (Signed)
Daughter, Scott [name] walked into the office stating that Scott [name] was seen by Dr. Corliss Skains on 05/15/2023. Daughter stated that Dr. Corliss Skains wanted to have an approval from Dr. Wyline Mood but was uncertain what he was speaking about. Called Dr. Corliss Skains office [phone] left message asking for a return call. Daughter was notified.

## 2023-05-16 NOTE — Telephone Encounter (Signed)
LMOVM  to [place] @ [phone] regarding patient Mr. Monterosso.

## 2023-05-19 NOTE — Telephone Encounter (Signed)
05/19/23 LMOVM to Morrie Sheldon at Dr. Corliss Skains office.

## 2023-05-29 ENCOUNTER — Telehealth: Payer: Self-pay | Admitting: Cardiology

## 2023-05-29 NOTE — Telephone Encounter (Signed)
   Pre-operative Risk Assessment    Patient Name: Scott [name]  DOB: 12/28/1927 MRN: [id number]      Request for Surgical Clearance    Procedure:   endovascular tx of his rt vertebrobasilar stenosis  Date of Surgery:  Clearance TBD                                 Surgeon:  Dr. Julieanne Cotton  Surgeon's Group or Practice Name:  [facility]  Phone number:  [phone] Fax number:  [phone]   Type of Clearance Requested:   - Pharmacy:  Hold Apixaban (Eliquis) possibly bridge with heparin    Type of Anesthesia:  General    Additional requests/questions:    [name]   05/29/2023, 4:47 PM

## 2023-05-30 NOTE — Telephone Encounter (Signed)
   Name: Scott [name]  DOB: [birth year]  MRN: [id number]  Primary Cardiologist: Dina Rich, MD  Chart reviewed as part of pre-operative protocol coverage. Because of [name]'s past medical history and time since last visit, he will require a follow-up telephone visit in order to better assess preoperative cardiovascular risk.  Pre-op covering staff: - Please schedule appointment and call patient to inform them. If patient already had an upcoming appointment within acceptable timeframe, please add "pre-op clearance" to the appointment notes so provider is aware. - Please contact requesting surgeon's office via preferred method (i.e, phone, fax) to inform them of need for appointment prior to surgery.  Patient is on aspirin for neurologic reasons (prior CVA, vertebrobasilar artery stenosis). Aspirin recommendations should come from neurology   Jonita Albee, PA-C  05/30/2023, 9:15 AM

## 2023-05-30 NOTE — Telephone Encounter (Signed)
Left message to call back to schedule a tele pre op appt.  ?

## 2023-05-30 NOTE — Telephone Encounter (Signed)
Patient with diagnosis of afib on Eliquis for anticoagulation.    Procedure: endovascular tx of his rt vertebrobasilar stenosis  Date of procedure: TBD  CHA2DS2-VASc Score = 6  This indicates a 9.7% annual risk of stroke. The patient's score is based upon: CHF History: 0 HTN History: 1 Diabetes History: 0 Stroke History: 2 Vascular Disease History: 1 Age Score: 2 Gender Score: 0   2018: MRI which demonstrated prior bilateral CVA and new L and R posterior brain CVA.   CrCl 76mL/min Platelet count 156K  Per office protocol, patient can hold Eliquis for 2-3 days prior to procedure.    **This guidance is not considered finalized until pre-operative APP has relayed final recommendations.**

## 2023-05-31 NOTE — Telephone Encounter (Signed)
2nd attempt to reach pt regarding surgical clearance and the need for an TELEVISIT appointment.  Left pt a detailed message to call back and get that scheduled.

## 2023-06-01 NOTE — Telephone Encounter (Signed)
Follow Up:      Patient's daughter is retuning call.

## 2023-06-01 NOTE — Telephone Encounter (Signed)
I s/w the pt's daughter and she stated would like a sooner appt in office. We have scheduled the pt to see Sharlene Dory, NP 06/02/23. Per pt's daughter they want to keep the appt with Dr. Wyline Mood in 07/2023 as planned as well. I will update all parties involved.

## 2023-06-02 ENCOUNTER — Encounter: Payer: Self-pay | Admitting: Nurse Practitioner

## 2023-06-02 ENCOUNTER — Ambulatory Visit: Payer: Medicare HMO | Admitting: Nurse Practitioner

## 2023-06-02 VITALS — BP 161/110 | HR 85 | Ht 66.0 in | Wt 164.4 lb

## 2023-06-02 DIAGNOSIS — Z8673 Personal history of transient ischemic attack (TIA), and cerebral infarction without residual deficits: Secondary | ICD-10-CM

## 2023-06-02 DIAGNOSIS — E785 Hyperlipidemia, unspecified: Secondary | ICD-10-CM | POA: Diagnosis not present

## 2023-06-02 DIAGNOSIS — I10 Essential (primary) hypertension: Secondary | ICD-10-CM

## 2023-06-02 DIAGNOSIS — I4891 Unspecified atrial fibrillation: Secondary | ICD-10-CM

## 2023-06-02 DIAGNOSIS — I779 Disorder of arteries and arterioles, unspecified: Secondary | ICD-10-CM

## 2023-06-02 DIAGNOSIS — Z01810 Encounter for preprocedural cardiovascular examination: Secondary | ICD-10-CM | POA: Diagnosis not present

## 2023-06-02 DIAGNOSIS — R0609 Other forms of dyspnea: Secondary | ICD-10-CM

## 2023-06-02 DIAGNOSIS — N1831 Chronic kidney disease, stage 3a: Secondary | ICD-10-CM

## 2023-06-02 DIAGNOSIS — R42 Dizziness and giddiness: Secondary | ICD-10-CM

## 2023-06-02 DIAGNOSIS — I739 Peripheral vascular disease, unspecified: Secondary | ICD-10-CM

## 2023-06-02 NOTE — Patient Instructions (Addendum)
Medication Instructions:   Continue all current medications.   Labwork:  none  Testing/Procedures:  Your physician has requested that you have an echocardiogram. Echocardiography is a painless test that uses sound waves to create images of your heart. It provides your doctor with information about the size and shape of your heart and how well your heart's chambers and valves are working. This procedure takes approximately one hour. There are no restrictions for this procedure. Please do NOT wear cologne, perfume, aftershave, or lotions (deodorant is allowed). Please arrive 15 minutes prior to your appointment time. Office will contact with results via phone, letter or mychart.     Follow-Up:  As planned for October   Any Other Special Instructions Will Be Listed Below (If Applicable).  BP log Salty six   If you need a refill on your cardiac medications before your next appointment, please call your pharmacy.

## 2023-06-02 NOTE — Progress Notes (Unsigned)
Cardiology Office Note:  .   Date:  06/02/2023 ID:  BRONCO STEM, DOB 1928-09-24, MRN [id number] PCP: Scott Alcide, MD   HeartCare Providers Cardiologist:  Scott Rich, MD    History of Present Illness: .   Scott [name] is a 89 y.o. male with a PMH of A-fib, history of palpitations, hypertension, hyperlipidemia, vertebrobasilar artery stenosis, orthostatic dizziness, PAD, prior CVA, and CKD stage 3a, who presents today for pre-operative cardiovascular risk assessment.  Previous history of DCCV in 2019, went back into A-fib.  Has been followed by Dr. Ivar [name] for known history of right sided stenosis and vertebrobasilar artery stenosis.  Last seen by Dr. Dina [name] on January 30, 2023.  He was overall doing well at that time.  He presents today for preoperative cardiovascular risk assessment.  He is pending endovascular treatment of his right vertebrobasilar stenosis with Dr. Corliss [name].  Surgery date is TBD.  Our office has been contacted regarding holding Eliquis with possible bridge with heparin.  Today he states he notices orthostatic dizziness, has been chronic for many years, appears to be stable per his report. He monitors his BP frequently at home and says will not take his Labetalol if SBP < 130 as he BP will drop and he will become symptomatic. Says he has not made a decision regarding if he will have surgery or not. Does admit to some DOE that is more noticeable as he says he is not as active as he used to be. Denies any chest pain, palpitations, syncope, orthopnea, PND, swelling or significant weight changes, acute bleeding, or claudication.   Studies Reviewed: Marland Kitchen    EKG: EKG Interpretation Date/Time:  Friday June 02 2023 15:09:04 EDT Ventricular Rate:  88 PR Interval:    QRS Duration:  66 QT Interval:  354 QTC Calculation: 428 R Axis:   75  Text Interpretation: Atrial fibrillation Septal infarct , age undetermined When compared with ECG of  31-Jan-2018 09:41, Atrial fibrillation has replaced Sinus rhythm Septal infarct is now Present Confirmed by Scott [name] [phone]) on 06/02/2023 3:21:46 PM   Cardiac monitor 08/2022: Continuous A-fib with 3 runs of V-tach, longest lasting 5 beats.   Carotid duplex 02/2018:  Final Interpretation:  Right Carotid: Velocities in the right ICA are consistent with a 1-39%  stenosis.   Left Carotid: Velocities in the left ICA are consistent with a 1-39%  stenosis.   Vertebrals: Bilateral vertebral arteries demonstrate antegrade flow.  Subclavians: Normal flow hemodynamics were seen in bilateral subclavian arteries.  Echo 11/2017: - Left ventricle: The cavity size was normal. Wall thickness was    increased increased in a pattern of mild to moderate LVH.    Systolic function was normal. The estimated ejection fraction was    in the range of 55% to 60%. Wall motion was normal; there were no    regional wall motion abnormalities. The study is not technically    sufficient to allow evaluation of LV diastolic function in the    setting of atrial fibrillation/flutter.  - Aortic valve: Mildly calcified annulus. Trileaflet.  - Mitral valve: Mildly calcified annulus. There was trivial    regurgitation.  - Left atrium: The atrium was mildly dilated.  - Right atrium: The atrium was mildly dilated. Central venous    pressure (est): 3 mm Hg.  - Atrial septum: No defect or patent foramen ovale was identified.  - Tricuspid valve: There was mild regurgitation.  - Pulmonary arteries: PA peak pressure: 25  mm Hg (S).  - Pericardium, extracardiac: There was no pericardial effusion.  Lexiscan 07/2015: There was no ST segment deviation noted during stress. Normal myocardial perfusion with soft tissue attenuation artifact. No ischemia or scar. This is a low risk study. Nuclear stress EF: 64%.  Risk Assessment/Calculations:    CHA2DS2-VASc Score = 6  This indicates a 9.7% annual risk of stroke. The  patient's score is based upon: CHF History: 0 HTN History: 1 Diabetes History: 0 Stroke History: 2 Vascular Disease History: 1 Age Score: 2 Gender Score: 0   Physical Exam:   VS:  BP (!) 161/110 (BP Location: Left Arm, Patient Position: Sitting, Cuff Size: Normal)   Pulse 85   Ht 5\' 6"  (1.676 m)   Wt 164 lb 6.4 oz (74.6 kg)   SpO2 100%   BMI 26.53 kg/m    Wt Readings from Last 3 Encounters:  06/02/23 164 lb 6.4 oz (74.6 kg)  01/30/23 166 lb 12.8 oz (75.7 kg)  10/27/22 167 lb (75.8 kg)    GEN: Well nourished, well developed in no acute distress NECK: No JVD; No carotid bruits CARDIAC: S1/S2, irregularly irregular, no murmurs, rubs, gallops RESPIRATORY:  Clear to auscultation without rales, wheezing or rhonchi  ABDOMEN: Soft, non-tender, non-distended EXTREMITIES:  No edema; No deformity   ASSESSMENT AND PLAN: .    Pre-operative cardiovascular risk assessment Scott [name]'s perioperative risk of a major cardiac event is 6.6% according to the Revised Cardiac Risk Index (RCRI).  Therefore, he is at high risk for perioperative complications.   His functional capacity is good at 4.74 METs according to the Duke Activity Status Index (DASI). Recommendations: The patient requires an echocardiogram before a disposition can be made regarding surgical risk.                Antiplatelet and/or Anticoagulation Recommendations: Patient is on Aspirin for neurological reasons. Aspirin recommendations should come from Neurology. Per office protocol, Eliquis can be held for 2-3 days prior to procedure. If Echo report comes back WNL, will addendum note and route note to requesting party. Pt has been notified about high risk of MACE and he verbalizes understanding.   2. A-fib Denies any tachycardia or palpitations. HR well controlled. Continue labetalol and diltiazem. Continue Eliquis for stroke prevention. Currently on appropriate dosage, denies any bleeding issues. Heart healthy diet encouraged.    3. Vertebrobasilar artery stenosis, HLD, PAD, orthostatic dizziness Closely followed by Dr. Corliss [name] but ADL's are affected by dizziness, pending surgical evaluation. Evidence of mild carotid artery stenosis along bilateral ICA's on carotid duplex 02/2018. Obtaining Echo as mentioned below due to DOE - see below. LDL 58 03/2022. Continue Aspirin - managed by Neuro and continue atorvastatin - managed by PCP. Continue to follow-up with PCP and Dr. Corliss [name]. Care and ED precautions discussed. Discussed conservative measures for orthostatic dizziness, chronic and stable over time per patient's report.  4. DOE Etiology most likely d/t deconditioning, however will obtain Echo for further evaluation. Euvolemic and well compensated on exam. No signs of volume overload on exam. Heart healthy diet encouraged. Care and ED precautions discussed.   4. Prior CVA Denies any stroke like symptoms. Continue Aspirin, also on Eliquis for stroke prevention - see above. Continue to follow with PCP.  5. HTN BP labile and does have some hx of orthostatic symptoms - see above. SBP averages 130-140's at home per his report. Stated d/t his condition and age, would rather air on side of SBP between 130 - 140 rather  than low BP as patient is symptomatic with SBP <130 per his report, and therefore increases his risk of falls. Discussed to monitor BP at home at least 2 hours after medications and sitting for 5-10 minutes. Given BP log and salty six and will update [place] with any abnormal readings if SBP > 140 or SBP < 100. No medication changes at this time. Continue to follow with PCP. Heart healthy diet encouraged.   6. CKD stage 3a Most recent labs reveal sCr at 1.32 with eGFR at 51. Avoid nephrotoxic agents. Encouraged adequate hydration. Continue to follow with PCP.  Dispo: Patient requests to follow-up with Dr. Dina [name] in October as scheduled. Stated pt may follow-up with me/APP sooner if  needed.  Signed, Scott Dory, NP

## 2023-06-04 ENCOUNTER — Encounter: Payer: Self-pay | Admitting: Nurse Practitioner

## 2023-06-21 ENCOUNTER — Ambulatory Visit: Payer: Medicare HMO | Attending: Nurse Practitioner

## 2023-06-21 DIAGNOSIS — R0609 Other forms of dyspnea: Secondary | ICD-10-CM

## 2023-06-21 LAB — ECHOCARDIOGRAM COMPLETE
AR max vel: 1.55 cm2
AV Area VTI: 1.62 cm2
AV Area mean vel: 1.55 cm2
AV Mean grad: 5.5 mmHg
AV Peak grad: 9.7 mmHg
Ao pk vel: 1.56 m/s
Calc EF: 66.5 %
MV VTI: 2.08 cm2
S' Lateral: 2.4 cm
Single Plane A2C EF: 74.6 %
Single Plane A4C EF: 59.8 %

## 2023-06-22 ENCOUNTER — Encounter: Payer: Self-pay | Admitting: Nurse Practitioner

## 2023-06-26 ENCOUNTER — Encounter: Payer: Self-pay | Admitting: Nurse Practitioner

## 2023-07-12 ENCOUNTER — Telehealth (HOSPITAL_COMMUNITY): Payer: Self-pay

## 2023-07-12 NOTE — Telephone Encounter (Signed)
Daughter called to see if cardiology faxed [place] the recent visit summary for Dr. Corliss Skains to view. I have forwarded the visit to Dr. Corliss Skains and our Neuro PA on today. AB

## 2023-07-27 ENCOUNTER — Telehealth (HOSPITAL_COMMUNITY): Payer: Self-pay

## 2023-07-27 NOTE — Telephone Encounter (Signed)
Returned call to daughter. Pt has seen cardiology and wondering about needing intervention. Per Dr. Corliss Skains, they may come in for a consult to discuss but it is unlikely that he will recommend intervention unless pt has failed medical management or sx have gotten worse. She will discuss with Scott [name] and call me back if they decide to schedule a consult. AB

## 2023-08-07 ENCOUNTER — Encounter: Payer: Self-pay | Admitting: Cardiology

## 2023-08-07 ENCOUNTER — Ambulatory Visit: Payer: Medicare HMO | Attending: Cardiology | Admitting: Cardiology

## 2023-08-07 VITALS — BP 104/60 | HR 88 | Ht 66.0 in | Wt 163.0 lb

## 2023-08-07 DIAGNOSIS — E785 Hyperlipidemia, unspecified: Secondary | ICD-10-CM | POA: Diagnosis not present

## 2023-08-07 DIAGNOSIS — I10 Essential (primary) hypertension: Secondary | ICD-10-CM

## 2023-08-07 DIAGNOSIS — I4891 Unspecified atrial fibrillation: Secondary | ICD-10-CM | POA: Diagnosis not present

## 2023-08-07 DIAGNOSIS — D6869 Other thrombophilia: Secondary | ICD-10-CM

## 2023-08-07 NOTE — Patient Instructions (Signed)
Medication Instructions:  Your physician recommends that you continue on your current medications as directed. Please refer to the Current Medication list given to you today.  *If you need a refill on your cardiac medications before your next appointment, please call your pharmacy*   Lab Work: None If you have labs (blood work) drawn today and your tests are completely normal, you will receive your results only by: MyChart Message (if you have MyChart) OR A paper copy in the mail If you have any lab test that is abnormal or we need to change your treatment, we will call you to review the results.   Testing/Procedures: None   Follow-Up: At [facility], you and your health needs are our priority.  As part of our continuing mission to provide you with exceptional heart care, we have created designated Provider Care Teams.  These Care Teams include your primary Cardiologist (physician) and Advanced Practice Providers (APPs -  Physician Assistants and Nurse Practitioners) who all work together to provide you with the care you need, when you need it.  We recommend signing up for the patient portal called "MyChart".  Sign up information is provided on this After Visit Summary.  MyChart is used to connect with patients for Virtual Visits (Telemedicine).  Patients are able to view lab/test results, encounter notes, upcoming appointments, etc.  Non-urgent messages can be sent to your provider as well.   To learn more about what you can do with MyChart, go to [email or web address].    Your next appointment:   6 month(s)  Provider:   Jonathan Branch, MD    Other Instructions    

## 2023-08-07 NOTE — Progress Notes (Signed)
Clinical Summary Mr. Shanley is a 89 y.o.male seen today for follow up of the following medical problems.      1. Afib  He underwent direct-current cardioversion in 2019 and subsequently went back into atrial fibrillation.  At that time  discussion was held with him about initiating antiarrhythmic therapy and the possibility of repeat direct-current cardioversion but the patient preferred a rate control strategy.     08/2022 monitor [facility]: 100% afib burden, HRs 37-165 avg 75. Occas PVCs   - occasoinal palpitations, overall not bothersome.  - no bleedig on eliquis.    2. HTN - 130s-160s/70s, some dizziness at times with standing. REports somewhat limited hydration   - checks home bp's daily, typically 140s. Rarely takes labetalol but like to have as a prn if needed     3. Hyperlipidemia - pcp follows labs - he is on atorvastatin 03/2022 TC 125 TG 46 HDL 56 LDL 58 - 06/2023 TC 161 TG 60 HDL 54 LDL 49     4. Prior CVA     5.  Vertebrobasilar artery stenosis: He has known right-sided stenosis and this is followed by Dr. Corliss Skains. He is on ASA 81mg  daily  pending endovascular treatment of his right vertebrobasilar stenosis with Dr. Corliss Skains  - patient still deciding on if he wants to proceed.   Past Medical History:  Diagnosis Date   Anxiety 10/31/2014   due to upcoming procedure   Arrhythmia    Arthritis    Cancer (HCC)    Melonoma on nose- removed   Chronic kidney disease    Dyspnea    Dysrhythmia    afib   Enlarged prostate    GERD (gastroesophageal reflux disease)    Headache    Hyperlipidemia    Hypertension    Hypothyroidism    Overactive bladder    Stroke (HCC)    TIA - "nothing taste right anymore, right eye pain   TIA (transient ischemic attack)    Wears glasses      Allergies  Allergen Reactions   Tramadol Nausea Only     Current Outpatient Medications  Medication Sig Dispense Refill   acetaminophen (TYLENOL) 500 MG tablet Take 1,000  mg by mouth 2 (two) times daily.     apixaban (ELIQUIS) 5 MG TABS tablet Take 1 tablet (5 mg total) by mouth 2 (two) times daily. 60 tablet 5   aspirin EC 81 MG tablet Take 81 mg by mouth at bedtime.      atorvastatin (LIPITOR) 10 MG tablet Take 10 mg by mouth every evening.      diltiazem (CARDIZEM) 60 MG tablet Take 1 tablet (60 mg total) by mouth 2 (two) times daily. (May take an extra 30mg  in the evening as needed)  Med increased 10/26/2017.   PLACE ON FILE 75 tablet 6   doxazosin (CARDURA) 8 MG tablet Take 8 mg by mouth at bedtime.     finasteride (PROSCAR) 5 MG tablet Take 5 mg by mouth daily.     fluticasone (FLONASE) 50 MCG/ACT nasal spray Place 2 sprays into both nostrils daily as needed for allergies or rhinitis.      labetalol (NORMODYNE) 100 MG tablet Take 100 mg by mouth 2 (two) times daily.     levothyroxine (SYNTHROID, LEVOTHROID) 150 MCG tablet Take 150 mcg daily before breakfast by mouth.     lisinopril (ZESTRIL) 20 MG tablet Take 20 mg by mouth daily.     Multiple Vitamins-Minerals (PRESERVISION AREDS  2) CAPS Take 1 tablet by mouth daily as needed.     omeprazole (PRILOSEC) 20 MG capsule Take 20 mg by mouth daily.     oxybutynin (DITROPAN) 5 MG tablet Take 1 tablet by mouth daily. (Patient not taking: Reported on 06/02/2023)     oxyCODONE-acetaminophen (PERCOCET) 10-325 MG tablet Take 1 tablet by mouth 2 (two) times daily as needed.     trolamine salicylate (ASPERCREME) 10 % cream Apply 1 application topically as needed for muscle pain.     No current facility-administered medications for this visit.   Facility-Administered Medications Ordered in Other Visits  Medication Dose Route Frequency Provider Last Rate Last Admin   clopidogrel (PLAVIX) tablet 75 mg  75 mg Oral 60 min Pre-Op Ralene Muskrat, PA-C         Past Surgical History:  Procedure Laterality Date   BACK SURGERY  08/2013   x2   CARDIOVERSION N/A 01/31/2018   Procedure: CARDIOVERSION;  Surgeon: Antoine Poche, MD;  Location: [facility];  Service: Endoscopy;  Laterality: N/A;   CATARACT EXTRACTION     COLONOSCOPY     EYE SURGERY Bilateral    cataract   IR ANGIO INTRA EXTRACRAN SEL COM CAROTID INNOMINATE BILAT MOD SED  08/04/2017   IR ANGIO VERTEBRAL SEL VERTEBRAL BILAT MOD SED  08/04/2017   IR ANGIO VERTEBRAL SEL VERTEBRAL UNI R MOD SED  11/01/2017   IR RADIOLOGIST EVAL & MGMT  07/28/2017   IR RADIOLOGIST EVAL & MGMT  08/22/2017   IR RADIOLOGIST EVAL & MGMT  11/29/2017   IR RADIOLOGIST EVAL & MGMT  05/16/2023   JOINT REPLACEMENT Left 10/2015   hip   KNEE ARTHROPLASTY Left 1999   KNEE ARTHROPLASTY Right 2008   LUMBAR FUSION  06/2014   RADIOLOGY WITH ANESTHESIA N/A 11/01/2017   Procedure: STENTING;  Surgeon: Julieanne Cotton, MD;  Location: [facility] OR;  Service: Radiology;  Laterality: N/A;   TRANSURETHRAL RESECTION OF PROSTATE  03/2015   TRIGGER FINGER RELEASE Right    3rd and 4th     Allergies  Allergen Reactions   Tramadol Nausea Only      Family History  Problem Relation Age of Onset   Coronary artery disease Mother      Social History Mr. Henandez reports that he quit smoking about 44 years ago. His smoking use included cigarettes. He started smoking about 57 years ago. He has never used smokeless tobacco. Mr. Srader reports no history of alcohol use.   Review of Systems CONSTITUTIONAL: No weight loss, fever, chills, weakness or fatigue.  HEENT: Eyes: No visual loss, blurred vision, double vision or yellow sclerae.No hearing loss, sneezing, congestion, runny nose or sore throat.  SKIN: No rash or itching.  CARDIOVASCULAR: per hpi RESPIRATORY: No shortness of breath, cough or sputum.  GASTROINTESTINAL: No anorexia, nausea, vomiting or diarrhea. No abdominal pain or blood.  GENITOURINARY: No burning on urination, no polyuria NEUROLOGICAL: No headache, dizziness, syncope, paralysis, ataxia, numbness or tingling in the extremities. No change in bowel or bladder  control.  MUSCULOSKELETAL: No muscle, back pain, joint pain or stiffness.  LYMPHATICS: No enlarged nodes. No history of splenectomy.  PSYCHIATRIC: No history of depression or anxiety.  ENDOCRINOLOGIC: No reports of sweating, cold or heat intolerance. No polyuria or polydipsia.  Marland Kitchen   Physical Examination Today's Vitals   08/07/23 1035  BP: 104/60  Pulse: 88  SpO2: 95%  Weight: 163 lb (73.9 kg)  Height: 5\' 6"  (1.676 m)   Body  mass index is 26.31 kg/m.  Gen: resting comfortably, no acute distress HEENT: no scleral icterus, pupils equal round and reactive, no palptable cervical adenopathy,  CV: irreg, no m/rg, no jvd Resp: Clear to auscultation bilaterally GI: abdomen is soft, non-tender, non-distended, normal bowel sounds, no hepatosplenomegaly MSK: extremities are warm, no edema.  Skin: warm, no rash Neuro:  no focal deficits Psych: appropriate affect   Diagnostic Studies  08/2022 monitor [facility]: 100% afib burden, HRs 37-165 avg 75. Occas PVCs   06/2023 echo 1. Left ventricular ejection fraction, by estimation, is 55 to 60%. The  left ventricle has normal function. The left ventricle has no regional  wall motion abnormalities. There is mild asymmetric left ventricular  hypertrophy of the basal segment. Left  ventricular diastolic parameters are indeterminate.   2. Right ventricular systolic function is normal. The right ventricular  size is normal. There is moderately elevated pulmonary artery systolic  pressure. The estimated right ventricular systolic pressure is 49.8 mmHg.   3. The mitral valve is degenerative. Mild mitral valve regurgitation.   4. The aortic valve is tricuspid. There is mild calcification of the  aortic valve. Aortic valve regurgitation is not visualized. Aortic valve  sclerosis/calcification is present, without any evidence of aortic  stenosis. Aortic valve mean gradient  measures 5.5 mmHg.   5. The inferior vena cava is normal in size with greater  than 50%  respiratory variability, suggesting right atrial pressure of 3 mmHg.    Assessment and Plan   1. Afib/acquired thrombophilia - mild symptoms, not to point he would like to adjust medications at this time - continue current meds including eliquis for stroke prevention   2. HTN - reasonable control given advanced age and orthostatic symptoms - we will continue current meds   3. Hyperlipidemia -lipids are at goal, continue current meds   F/u 6 months    Antoine Poche, M.D.

## 2023-11-22 ENCOUNTER — Telehealth (HOSPITAL_COMMUNITY): Payer: Self-pay

## 2023-11-22 NOTE — Telephone Encounter (Signed)
 Called to schedule mra. Pt would like to hold off for right now. AB

## 2024-01-12 ENCOUNTER — Emergency Department (HOSPITAL_COMMUNITY)

## 2024-01-12 ENCOUNTER — Emergency Department (HOSPITAL_COMMUNITY)
Admission: EM | Admit: 2024-01-12 | Discharge: 2024-01-12 | Disposition: A | Discharge status: Home / Self Care | Attending: Emergency Medicine | Admitting: Emergency Medicine

## 2024-01-12 ENCOUNTER — Other Ambulatory Visit: Payer: Self-pay

## 2024-01-12 DIAGNOSIS — Z8673 Personal history of transient ischemic attack (TIA), and cerebral infarction without residual deficits: Secondary | ICD-10-CM | POA: Diagnosis not present

## 2024-01-12 DIAGNOSIS — Z79899 Other long term (current) drug therapy: Secondary | ICD-10-CM | POA: Insufficient documentation

## 2024-01-12 DIAGNOSIS — R42 Dizziness and giddiness: Secondary | ICD-10-CM | POA: Insufficient documentation

## 2024-01-12 DIAGNOSIS — Z7982 Long term (current) use of aspirin: Secondary | ICD-10-CM | POA: Insufficient documentation

## 2024-01-12 DIAGNOSIS — E039 Hypothyroidism, unspecified: Secondary | ICD-10-CM | POA: Diagnosis not present

## 2024-01-12 DIAGNOSIS — Z7901 Long term (current) use of anticoagulants: Secondary | ICD-10-CM | POA: Insufficient documentation

## 2024-01-12 DIAGNOSIS — I10 Essential (primary) hypertension: Secondary | ICD-10-CM | POA: Insufficient documentation

## 2024-01-12 DIAGNOSIS — R55 Syncope and collapse: Secondary | ICD-10-CM | POA: Diagnosis present

## 2024-01-12 LAB — CBC WITH DIFFERENTIAL/PLATELET
Abs Immature Granulocytes: 0.02 10*3/uL (ref 0.00–0.07)
Basophils Absolute: 0 10*3/uL (ref 0.0–0.1)
Basophils Relative: 1 %
Eosinophils Absolute: 0 10*3/uL (ref 0.0–0.5)
Eosinophils Relative: 1 %
HCT: 42.4 % (ref 39.0–52.0)
Hemoglobin: 14.2 g/dL (ref 13.0–17.0)
Immature Granulocytes: 0 %
Lymphocytes Relative: 19 %
Lymphs Abs: 0.9 10*3/uL (ref 0.7–4.0)
MCH: 31.3 pg (ref 26.0–34.0)
MCHC: 33.5 g/dL (ref 30.0–36.0)
MCV: 93.6 fL (ref 80.0–100.0)
Monocytes Absolute: 0.6 10*3/uL (ref 0.1–1.0)
Monocytes Relative: 13 %
Neutro Abs: 3.1 10*3/uL (ref 1.7–7.7)
Neutrophils Relative %: 66 %
Platelets: 144 10*3/uL — ABNORMAL LOW (ref 150–400)
RBC: 4.53 MIL/uL (ref 4.22–5.81)
RDW: 14.5 % (ref 11.5–15.5)
WBC: 4.6 10*3/uL (ref 4.0–10.5)
nRBC: 0 % (ref 0.0–0.2)

## 2024-01-12 LAB — BASIC METABOLIC PANEL WITH GFR
Anion gap: 7 (ref 5–15)
BUN: 15 mg/dL (ref 8–23)
CO2: 26 mmol/L (ref 22–32)
Calcium: 8.7 mg/dL — ABNORMAL LOW (ref 8.9–10.3)
Chloride: 102 mmol/L (ref 98–111)
Creatinine, Ser: 1.16 mg/dL (ref 0.61–1.24)
GFR, Estimated: 59 mL/min — ABNORMAL LOW (ref >60)
Glucose, Bld: 108 mg/dL — ABNORMAL HIGH (ref 70–99)
Potassium: 4.2 mmol/L (ref 3.5–5.1)
Sodium: 135 mmol/L (ref 135–145)

## 2024-01-12 MED ORDER — MECLIZINE HCL 12.5 MG PO TABS: 12.5000 mg | ORAL_TABLET | Freq: Three times a day (TID) | ORAL | 0 refills | Status: DC | PRN

## 2024-01-12 MED ORDER — DILTIAZEM HCL 30 MG PO TABS
60.0000 mg | ORAL_TABLET | Freq: Once | ORAL | Status: AC
  Administered 2024-01-12: 60 mg via ORAL
  Filled 2024-01-12: qty 2

## 2024-01-12 MED ORDER — MECLIZINE HCL 12.5 MG PO TABS
12.5000 mg | ORAL_TABLET | Freq: Once | ORAL | Status: AC
  Administered 2024-01-12: 12.5 mg via ORAL
  Filled 2024-01-12: qty 1

## 2024-01-12 NOTE — Discharge Instructions (Addendum)
 Your MRI today is stable with no evidence of worsening of this stenosed blood vessel.  You are being prescribed meclizine to help you with your vertigo.  Use caution with this medication as it can cause sleepiness.  Use caution when changing your position and I recommend assistance with ambulation so that you do not fall.  Also recommend minimizing positional changes to also help minimize vertigo symptoms.

## 2024-01-12 NOTE — ED Provider Notes (Signed)
 [facility] EMERGENCY DEPARTMENT AT [facility] Provider Note   CSN: [id number] Arrival date & time: 01/12/24  4098     Scott  Chief Complaint  Patient presents with   Dizziness    Scott [name] is a 89 y.o. 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 Eustace Quail presenting for a severe episode of vertigo which started last night and was intermittently present through the majority of the night, describes room spinning with head positional changes.  Denies fall or injury.  He is currently feeling improved.  His symptoms are described as room spinning quality with any positional changes.  He denies vision changes, tinnitus, nausea or vomiting, headache, also denies chest pain, shortness of breath or palpitations although he does present with a mild tachycardia and is in A-fib on monitor.  He has had none of his morning medications prior to arrival including his diltiazem.  He endorses [name] days he holds his labetalol as his blood pressures usually run in the 110-115 range.  He has not taken this this morning either.  The Scott is provided by the patient.       Home Medications Prior to Admission medications   Medication Sig Start Date End Date Taking? Authorizing Provider  acetaminophen (TYLENOL) 500 MG tablet Take 1,000 mg by mouth 2 (two) times daily.    [provider]  apixaban ([name]) 5 MG TABS tablet Take 1 tablet (5 mg total) by mouth 2 (two) times daily. 05/16/23   Sharlene Dory, NP  aspirin EC 81 MG tablet Take 81 mg by mouth at bedtime.     [provider]  atorvastatin (LIPITOR) 10 MG tablet Take 10 mg by mouth every evening.     [provider]  Cholecalciferol 125 MCG (5000 UT)  TABS Take 2 tablets by mouth daily. 01/20/22   [provider]  diltiazem (CARDIZEM) 60 MG tablet Take 1 tablet (60 mg total) by mouth 2 (two) times daily. (May take an extra 30mg  in the evening as needed)  Med increased 10/26/2017.   PLACE ON FILE 10/26/17   Laqueta Linden, MD  doxazosin (CARDURA) 8 MG tablet Take 8 mg by mouth at bedtime.    [provider]  finasteride (PROSCAR) 5 MG tablet Take 5 mg by mouth daily. 04/15/19   [provider]  fluticasone (FLONASE) 50 MCG/ACT nasal spray Place 2 sprays into both nostrils daily as needed for allergies or rhinitis.     [provider]  labetalol (NORMODYNE) 100 MG tablet Take 100 mg by mouth 2 (two) times daily.    [provider]  levothyroxine (SYNTHROID, LEVOTHROID) 150 MCG tablet Take 150 mcg daily before breakfast by mouth.    [provider]  lisinopril (ZESTRIL) 5 MG tablet Take 5 mg by mouth daily. 08/04/23   [provider]  meclizine (ANTIVERT) 12.5 MG tablet Take 1 tablet (12.5 mg total) by mouth 3 (three) times daily as needed for dizziness. 01/12/24   Burgess Amor, PA-C  Multiple Vitamins-Minerals (PRESERVISION AREDS 2) CAPS Take 1 tablet by mouth daily as needed.    [provider]  omeprazole (PRILOSEC) 20 MG capsule Take 20 mg by mouth daily.    [provider]  oxybutynin (DITROPAN) 5 MG tablet Take 1 tablet by  mouth daily. 07/27/16   [provider]  oxyCODONE-acetaminophen (PERCOCET) 10-325 MG tablet Take 1 tablet by mouth 2 (two) times daily as needed. 05/07/19   [provider]  trolamine salicylate (ASPERCREME) 10 % cream Apply 1 application topically as needed for muscle pain.    [provider]      Allergies    Tramadol    Review of Systems   Review of Systems  Constitutional:  Negative for chills and fever.  HENT:  Negative for congestion and sore throat.   Eyes: Negative.  Negative for visual disturbance.   Respiratory:  Negative for chest tightness and shortness of breath.   Cardiovascular:  Negative for chest pain, palpitations and leg swelling.  Gastrointestinal:  Negative for abdominal pain, nausea and vomiting.  Genitourinary: Negative.   Musculoskeletal:  Negative for arthralgias, joint swelling and neck pain.  Skin: Negative.  Negative for rash and wound.  Neurological:  Positive for dizziness. Negative for weakness, light-headedness, numbness and headaches.  Psychiatric/Behavioral: Negative.      Physical Exam Updated Vital Signs BP (!) 144/88   Pulse 84   Temp 98.6 F (37 C) (Oral)   Resp 15   Ht 5\' 6"  (1.676 m)   Wt 74.8 kg   SpO2 99%   BMI 26.63 kg/m  Physical Exam Vitals and nursing note reviewed.  Constitutional:      Appearance: He is well-developed.  HENT:     Head: Normocephalic and atraumatic.     Right Ear: Tympanic membrane normal.     Left Ear: Tympanic membrane normal.  Eyes:     Extraocular Movements: Extraocular movements intact.     Right eye: No nystagmus.     Left eye: No nystagmus.     Pupils: Pupils are equal, round, and reactive to light.  Cardiovascular:     Rate and Rhythm: Normal rate.     Heart sounds: Normal heart sounds.  Pulmonary:     Effort: Pulmonary effort is normal.  Abdominal:     Palpations: Abdomen is soft.     Tenderness: There is no abdominal tenderness.  Musculoskeletal:        General: Normal range of motion.     Cervical back: Normal range of motion and neck supple.  Lymphadenopathy:     Cervical: No cervical adenopathy.  Skin:    General: Skin is warm and dry.     Findings: No rash.  Neurological:     General: No focal deficit present.     Mental Status: He is alert and oriented to person, place, and time.     GCS: GCS eye subscore is 4. GCS verbal subscore is 5. GCS motor subscore is 6.     Cranial Nerves: No cranial nerve deficit.     Sensory: No sensory deficit.     Coordination: Coordination normal.  Finger-Nose-Finger Test normal.     Gait: Gait normal.     Deep Tendon Reflexes: Reflexes normal.     Comments: Normal heel-shin, normal rapid alternating movement, Cranial nerves III-XII intact.  No pronator drift.  Psychiatric:        Speech: Speech normal.        Behavior: Behavior normal.        Thought Content: Thought content normal.     ED Results / Procedures / Treatments   Labs (all labs ordered are listed, but only abnormal results are displayed) Labs Reviewed  CBC WITH DIFFERENTIAL/PLATELET - Abnormal; Notable for the following components:  Result Value   Platelets 144 (*)    All other components within normal limits  BASIC METABOLIC PANEL WITH GFR - Abnormal; Notable for the following components:   Glucose, Bld 108 (*)    Calcium 8.7 (*)    GFR, Estimated 59 (*)    All other components within normal limits    EKG EKG Interpretation Date/Time:  Friday January 12 2024 09:33:33 EDT Ventricular Rate:  102 PR Interval:    QRS Duration:  73 QT Interval:  348 QTC Calculation: 454 R Axis:   73  Text Interpretation: Scott fibrillation Anterior infarct, old No significant change since prior 8/24 Confirmed by Meridee Score [phone]) on 01/12/2024 9:38:28 AM  Radiology MR ANGIO HEAD WO CONTRAST Result Date: 01/12/2024 CLINICAL DATA:  Syncope/presyncope, cerebrovascular cause suspected; Vertigo, central has known severe vertebral artery stenosis. EXAM: MRI HEAD WITHOUT CONTRAST MRA HEAD WITHOUT CONTRAST TECHNIQUE: Multiplanar, multi-echo pulse sequences of the brain and surrounding structures were acquired without intravenous contrast. Angiographic images of the Circle of Willis were acquired using MRA technique without intravenous contrast. COMPARISON:  MRA head 05/14/2023.  MRI brain 05/04/2020. FINDINGS: MRI HEAD FINDINGS Brain: No acute infarct or hemorrhage. Stable background of moderate-to-severe chronic small vessel disease with old perforator infarcts in left anterior  thalamus and bilateral cerebellar hemispheres. No hydrocephalus or extra-axial collection. No mass or midline shift. No foci of abnormal susceptibility. Vascular: Normal flow voids. Skull and upper cervical spine: Normal marrow signal. Sinuses/Orbits: Moderate mucosal disease in the right sphenoid sinus. Orbits are unremarkable. Other: None. MRA HEAD FINDINGS Anterior circulation: Unchanged mild stenosis of the posterior genu of the right ICA cavernous segment. The left the ICA is patent without stenosis or aneurysm. Unchanged moderate stenosis of the left ACA A2 segment. The right ACA and bilateral MCAs are patent proximally without stenosis or aneurysm. Distal branches are symmetric. Posterior circulation: Dominant right vertebral artery. Unchanged moderate to severe stenosis of the right V4 segment. Patent basilar artery. The SCAs, AICAs and PICAs are patent proximally. The PCAs are patent proximally without stenosis or aneurysm. Distal branches are symmetric. Anatomic variants: Persistent fetal origin of the left PCA with hypoplastic left P1 segment. IMPRESSION: 1. No acute intracranial abnormality or mass. 2. Stable background of moderate-to-severe chronic small vessel disease with old perforator infarcts in left anterior thalamus and bilateral cerebellar hemispheres. 3. Unchanged moderate to severe stenosis of the right V4 segment. 4. Unchanged moderate stenosis of the left ACA A2 segment. Electronically Signed   By: Orvan Falconer M.D.   On: 01/12/2024 11:14   MR BRAIN WO CONTRAST Result Date: 01/12/2024 CLINICAL DATA:  Syncope/presyncope, cerebrovascular cause suspected; Vertigo, central has known severe vertebral artery stenosis. EXAM: MRI HEAD WITHOUT CONTRAST MRA HEAD WITHOUT CONTRAST TECHNIQUE: Multiplanar, multi-echo pulse sequences of the brain and surrounding structures were acquired without intravenous contrast. Angiographic images of the Circle of Willis were acquired using MRA technique without  intravenous contrast. COMPARISON:  MRA head 05/14/2023.  MRI brain 05/04/2020. FINDINGS: MRI HEAD FINDINGS Brain: No acute infarct or hemorrhage. Stable background of moderate-to-severe chronic small vessel disease with old perforator infarcts in left anterior thalamus and bilateral cerebellar hemispheres. No hydrocephalus or extra-axial collection. No mass or midline shift. No foci of abnormal susceptibility. Vascular: Normal flow voids. Skull and upper cervical spine: Normal marrow signal. Sinuses/Orbits: Moderate mucosal disease in the right sphenoid sinus. Orbits are unremarkable. Other: None. MRA HEAD FINDINGS Anterior circulation: Unchanged mild stenosis of the posterior genu of the right ICA cavernous segment.  The left the ICA is patent without stenosis or aneurysm. Unchanged moderate stenosis of the left ACA A2 segment. The right ACA and bilateral MCAs are patent proximally without stenosis or aneurysm. Distal branches are symmetric. Posterior circulation: Dominant right vertebral artery. Unchanged moderate to severe stenosis of the right V4 segment. Patent basilar artery. The SCAs, AICAs and PICAs are patent proximally. The PCAs are patent proximally without stenosis or aneurysm. Distal branches are symmetric. Anatomic variants: Persistent fetal origin of the left PCA with hypoplastic left P1 segment. IMPRESSION: 1. No acute intracranial abnormality or mass. 2. Stable background of moderate-to-severe chronic small vessel disease with old perforator infarcts in left anterior thalamus and bilateral cerebellar hemispheres. 3. Unchanged moderate to severe stenosis of the right V4 segment. 4. Unchanged moderate stenosis of the left ACA A2 segment. Electronically Signed   By: Orvan Falconer M.D.   On: 01/12/2024 11:14    Procedures Procedures    Medications Ordered in ED Medications  diltiazem (CARDIZEM) tablet 60 mg (60 mg Oral Given 01/12/24 0929)  meclizine (ANTIVERT) tablet 12.5 mg (12.5 mg Oral  Given 01/12/24 1217)    ED Course/ Medical Decision Making/ A&P Clinical Course as of 01/12/24 1454  Fri Jan 12, 2024  1512 89 year old male with Scott of dizziness here with worsening dizziness.  Nonfocal exam.  Imaging showing stable findings.  Will trial him on some meclizine and see if it safe for him to return home. [MB]    Clinical Course User Index [MB] Terrilee Files, MD                                 Medical Decision Making Patient presenting with a Scott of vertigo, worse since yesterday, intermittently better currently however.  He has no neurologic focal deficits on exam aside from symptoms, he does not have nystagmus on exam.  He does have a known vertebral artery stenosis which is being followed by Dr. Corliss Skains with interventional radiology.  Concerning for worsening condition.  He is also on [name] secondary to Scott fibrillation, although he has no other focal neurofindings based on exam or Scott, MR I head was completed in addition to MRA to assess any worsening of his vascular stenosis.  Differential including worsening of this condition, [name], peripheral vertigo.  Imaging is stable and negative for any acute new findings.  He was given a low dose of meclizine 12.5 mg given age, it did not significantly improve his dizziness.  He was able to sit from his supine position without difficulty, he became dizzy with attempts at standing.  We discussed admission to the hospital, we could also add more meclizine although would need to be cautious with this medication and this geriatric patient.  Daughter at bedside involved in conversation.  Patient is not interested in hospitalization, he states he spends a lot of time sleeping or supine at baseline and can easily do this at home as well as here and would rather be at home.  He is alert and oriented, has capacity to make this decision.  Advised return here for any worsening symptoms or close follow-up with his providers, he  plans to reach out to Dr. Corliss Skains soon, although last conversation determined risks of intervention outweighed possible benefit.  Amount and/or Complexity of Data Reviewed Labs: ordered.    Details: Be met and CBC obtained, reviewed and no significant abnormalities Radiology: ordered.    Details: MRI brain  and MRA completed, stable findings.  Risk Prescription drug management.           Final Clinical Impression(s) / ED Diagnoses Final diagnoses:  Vertigo    Rx / DC Orders ED Discharge Orders          Ordered    meclizine (ANTIVERT) 12.5 MG tablet  3 times daily PRN,   Status:  Discontinued        01/12/24 1407    meclizine (ANTIVERT) 12.5 MG tablet  3 times daily PRN        01/12/24 1447              Burgess Amor, Cordelia Poche 01/12/24 1454    Terrilee Files, MD 01/12/24 [phone]

## 2024-01-12 NOTE — ED Triage Notes (Signed)
 Patient reports experiencing dizziness last night around 1930. Reports that the room starts to spin around. Patient has not experienced a fall. Reports no pain. Patient has been experiencing dizziness for the past 2-3 years, but reports that it got worse last night. Reports that the dizziness is triggered when he lays his head back.

## 2024-01-15 ENCOUNTER — Other Ambulatory Visit (HOSPITAL_COMMUNITY): Payer: Self-pay | Admitting: Interventional Radiology

## 2024-01-15 DIAGNOSIS — R42 Dizziness and giddiness: Secondary | ICD-10-CM

## 2024-01-15 DIAGNOSIS — I771 Stricture of artery: Secondary | ICD-10-CM

## 2024-01-26 ENCOUNTER — Ambulatory Visit (HOSPITAL_COMMUNITY): Attending: Interventional Radiology

## 2024-02-15 ENCOUNTER — Ambulatory Visit (HOSPITAL_COMMUNITY)
Admission: RE | Admit: 2024-02-15 | Discharge: 2024-02-15 | Disposition: A | Discharge status: Home / Self Care | Source: Ambulatory Visit | Attending: Interventional Radiology | Admitting: Interventional Radiology

## 2024-02-15 DIAGNOSIS — I771 Stricture of artery: Secondary | ICD-10-CM

## 2024-02-15 DIAGNOSIS — R42 Dizziness and giddiness: Secondary | ICD-10-CM

## 2024-06-03 ENCOUNTER — Encounter: Payer: Self-pay | Admitting: Cardiology

## 2024-06-03 ENCOUNTER — Ambulatory Visit: Attending: Cardiology | Admitting: Cardiology

## 2024-06-03 VITALS — BP 116/74 | HR 49 | Ht 66.0 in | Wt 154.6 lb

## 2024-06-03 DIAGNOSIS — I4891 Unspecified atrial fibrillation: Secondary | ICD-10-CM

## 2024-06-03 DIAGNOSIS — I10 Essential (primary) hypertension: Secondary | ICD-10-CM | POA: Diagnosis not present

## 2024-06-03 DIAGNOSIS — D6869 Other thrombophilia: Secondary | ICD-10-CM

## 2024-06-03 DIAGNOSIS — E782 Mixed hyperlipidemia: Secondary | ICD-10-CM

## 2024-06-03 NOTE — Patient Instructions (Addendum)
 Medication Instructions:   Stop Labetalol  (Normodyne )  Continue all other medications.     Labwork:  none  Testing/Procedures:  none  Follow-Up:  6 months   Any Other Special Instructions Will Be Listed Below (If Applicable).   If you need a refill on your cardiac medications before your next appointment, please call your pharmacy.

## 2024-06-03 NOTE — Progress Notes (Signed)
 Clinical Summary [name] is a 89 y.o.male seen today for follow up of the following medical problems.      1. Afib  He underwent direct-current cardioversion in 2019 and subsequently went back into atrial fibrillation.  At that time  discussion was held with him about initiating antiarrhythmic therapy and the possibility of repeat direct-current cardioversion but the patient preferred a rate control strategy.     08/2022 monitor [facility]: 100% afib burden, HRs 37-165 avg 75. Occas PVCs     - home heart rates 70s-115s. No symptoms. No recent lightheadedness or dizzinesss - compliant with meds   2. HTN - bp's 130s/70s     3. Hyperlipidemia - pcp follows labs - he is on atorvastatin 03/2022 TC 125 TG 46 HDL 56 LDL 58 - 06/2023 TC 883 TG 60 HDL 54 LDL 49     4. Prior CVA     5.  Vertebrobasilar artery stenosis: He has known right-sided stenosis and this is followed by Dr. Dolphus. He is on ASA 81mg  daily, continue use deferred to their team.    pending endovascular treatment of his right vertebrobasilar stenosis with Dr. Dolphus  - patient still deciding on if he wants to proceed.  - he has decided not to proceed with procedure Past Medical History:  Diagnosis Date   Anxiety 10/31/2014   due to upcoming procedure   Arrhythmia    Arthritis    Cancer (HCC)    Melonoma on nose- removed   Chronic kidney disease    Dyspnea    Dysrhythmia    afib   Enlarged prostate    GERD (gastroesophageal reflux disease)    Headache    Hyperlipidemia    Hypertension    Hypothyroidism    Overactive bladder    Stroke (HCC)    TIA - nothing taste right anymore, right eye pain   TIA (transient ischemic attack)    Wears glasses      Allergies  Allergen Reactions   Tramadol Nausea Only     Current Outpatient Medications  Medication Sig Dispense Refill   acetaminophen  (TYLENOL ) 500 MG tablet Take 1,000 mg by mouth 2 (two) times daily.     apixaban  (ELIQUIS ) 5 MG TABS  tablet Take 1 tablet (5 mg total) by mouth 2 (two) times daily. 60 tablet 5   aspirin  EC 81 MG tablet Take 81 mg by mouth at bedtime.      atorvastatin (LIPITOR) 10 MG tablet Take 10 mg by mouth every evening.      Cholecalciferol 125 MCG (5000 UT) TABS Take 2 tablets by mouth daily.     diltiazem  (CARDIZEM ) 60 MG tablet Take 1 tablet (60 mg total) by mouth 2 (two) times daily. (May take an extra 30mg  in the evening as needed)  Med increased 10/26/2017.   PLACE ON FILE 75 tablet 6   doxazosin (CARDURA) 8 MG tablet Take 8 mg by mouth at bedtime.     finasteride (PROSCAR) 5 MG tablet Take 5 mg by mouth daily.     fluticasone (FLONASE) 50 MCG/ACT nasal spray Place 2 sprays into both nostrils daily as needed for allergies or rhinitis.      labetalol  (NORMODYNE ) 100 MG tablet Take 100 mg by mouth 2 (two) times daily.     levothyroxine (SYNTHROID, LEVOTHROID) 150 MCG tablet Take 150 mcg daily before breakfast by mouth.     lisinopril (ZESTRIL) 5 MG tablet Take 5 mg by mouth daily.  meclizine  (ANTIVERT ) 12.5 MG tablet Take 1 tablet (12.5 mg total) by mouth 3 (three) times daily as needed for dizziness. 10 tablet 0   Multiple Vitamins-Minerals (PRESERVISION AREDS 2) CAPS Take 1 tablet by mouth daily as needed.     omeprazole (PRILOSEC) 20 MG capsule Take 20 mg by mouth daily.     oxybutynin (DITROPAN) 5 MG tablet Take 1 tablet by mouth daily.     oxyCODONE -acetaminophen  (PERCOCET) 10-325 MG tablet Take 1 tablet by mouth 2 (two) times daily as needed.     trolamine salicylate (ASPERCREME) 10 % cream Apply 1 application topically as needed for muscle pain.     No current facility-administered medications for this visit.   Facility-Administered Medications Ordered in Other Visits  Medication Dose Route Frequency Provider Last Rate Last Admin   clopidogrel  (PLAVIX ) tablet 75 mg  75 mg Oral 60 min Pre-Op Christine Males, PA-C         Past Surgical History:  Procedure Laterality Date   BACK SURGERY   08/2013   x2   CARDIOVERSION N/A 01/31/2018   Procedure: CARDIOVERSION;  Surgeon: Alvan Dorn FALCON, MD;  Location: [facility];  Service: Endoscopy;  Laterality: N/A;   CATARACT EXTRACTION     COLONOSCOPY     EYE SURGERY Bilateral    cataract   IR ANGIO INTRA EXTRACRAN SEL COM CAROTID INNOMINATE BILAT MOD SED  08/04/2017   IR ANGIO VERTEBRAL SEL VERTEBRAL BILAT MOD SED  08/04/2017   IR ANGIO VERTEBRAL SEL VERTEBRAL UNI R MOD SED  11/01/2017   IR RADIOLOGIST EVAL & MGMT  07/28/2017   IR RADIOLOGIST EVAL & MGMT  08/22/2017   IR RADIOLOGIST EVAL & MGMT  11/29/2017   IR RADIOLOGIST EVAL & MGMT  05/16/2023   JOINT REPLACEMENT Left 10/2015   hip   KNEE ARTHROPLASTY Left 1999   KNEE ARTHROPLASTY Right 2008   LUMBAR FUSION  06/2014   RADIOLOGY WITH ANESTHESIA N/A 11/01/2017   Procedure: STENTING;  Surgeon: Dolphus Carrion, MD;  Location: [facility] OR;  Service: Radiology;  Laterality: N/A;   TRANSURETHRAL RESECTION OF PROSTATE  03/2015   TRIGGER FINGER RELEASE Right    3rd and 4th     Allergies  Allergen Reactions   Tramadol Nausea Only      Family History  Problem Relation Age of Onset   Coronary artery disease Mother      Social History Mr. Berthold reports that he quit smoking about 45 years ago. His smoking use included cigarettes. He started smoking about 58 years ago. He has never used smokeless tobacco. Mr. Beaulieu reports no history of alcohol  use.     Physical Examination Today's Vitals   06/03/24 1257  BP: 116/74  Pulse: (!) 49  SpO2: 95%  Weight: 154 lb 9.6 oz (70.1 kg)  Height: 5' 6 (1.676 m)   Body mass index is 24.95 kg/m.  Gen: resting comfortably, no acute distress HEENT: no scleral icterus, pupils equal round and reactive, no palptable cervical adenopathy,  CV: irreg, no mrg, no vjd Resp: Clear to auscultation bilaterally GI: abdomen is soft, non-tender, non-distended, normal bowel sounds, no hepatosplenomegaly MSK: extremities are warm, no edema.   Skin: warm, no rash Neuro:  no focal deficits Psych: appropriate affect   Diagnostic Studies 08/2022 monitor [facility]: 100% afib burden, HRs 37-165 avg 75. Occas PVCs    06/2023 echo 1. Left ventricular ejection fraction, by estimation, is 55 to 60%. The  left ventricle has normal function. The left ventricle  has no regional  wall motion abnormalities. There is mild asymmetric left ventricular  hypertrophy of the basal segment. Left  ventricular diastolic parameters are indeterminate.   2. Right ventricular systolic function is normal. The right ventricular  size is normal. There is moderately elevated pulmonary artery systolic  pressure. The estimated right ventricular systolic pressure is 49.8 mmHg.   3. The mitral valve is degenerative. Mild mitral valve regurgitation.   4. The aortic valve is tricuspid. There is mild calcification of the  aortic valve. Aortic valve regurgitation is not visualized. Aortic valve  sclerosis/calcification is present, without any evidence of aortic  stenosis. Aortic valve mean gradient  measures 5.5 mmHg.   5. The inferior vena cava is normal in size with greater than 50%  respiratory variability, suggesting right atrial pressure of 3 mmHg.     Assessment and Plan   1. Afib/acquired thrombophilia -no symptoms, continue current meds. Low HR by dynamap but normal rates on exam - on eliquis  for stroke prevention. He is also on ASA per IR for vertebrobasilar stenosis and not for cardiac indication   2. HTN - at goal, continue current meds   3. Hyperlipidemia -Lipids have been at goal, check with pcp more more recent labs  F/u 6 months     Dorn PHEBE Ross, M.D.
